# Patient Record
Sex: Male | Born: 1945 | Race: Black or African American | Hispanic: No | Smoking: Former smoker
Health system: Southern US, Community
[De-identification: ages and names within clinical notes are randomized; demographics above are authoritative.]

## PROBLEM LIST (undated history)

## (undated) DIAGNOSIS — I10 Essential (primary) hypertension: Secondary | ICD-10-CM

## (undated) DIAGNOSIS — B019 Varicella without complication: Secondary | ICD-10-CM

## (undated) DIAGNOSIS — R011 Cardiac murmur, unspecified: Secondary | ICD-10-CM

## (undated) DIAGNOSIS — S0591XA Unspecified injury of right eye and orbit, initial encounter: Secondary | ICD-10-CM

## (undated) DIAGNOSIS — G459 Transient cerebral ischemic attack, unspecified: Secondary | ICD-10-CM

## (undated) HISTORY — DX: Varicella without complication: B01.9

## (undated) HISTORY — PX: HERNIA REPAIR: SHX51

## (undated) HISTORY — DX: Transient cerebral ischemic attack, unspecified: G45.9

## (undated) HISTORY — PX: ABDOMINAL SURGERY: SHX537

## (undated) HISTORY — DX: Unspecified injury of right eye and orbit, initial encounter: S05.91XA

## (undated) HISTORY — DX: Cardiac murmur, unspecified: R01.1

## (undated) HISTORY — PX: AMPUTATION FINGER / THUMB: SUR24

## (undated) HISTORY — PX: ENUCLEATION: SHX628

---

## 1999-02-19 ENCOUNTER — Encounter: Payer: Self-pay | Admitting: Emergency Medicine

## 1999-02-19 ENCOUNTER — Emergency Department (HOSPITAL_COMMUNITY): Admission: EM | Admit: 1999-02-19 | Discharge: 1999-02-19 | Payer: Self-pay | Admitting: Emergency Medicine

## 2002-03-25 ENCOUNTER — Emergency Department (HOSPITAL_COMMUNITY): Admission: EM | Admit: 2002-03-25 | Discharge: 2002-03-25 | Payer: Self-pay | Admitting: Emergency Medicine

## 2002-03-25 ENCOUNTER — Encounter: Payer: Self-pay | Admitting: Emergency Medicine

## 2003-10-22 ENCOUNTER — Emergency Department (HOSPITAL_COMMUNITY): Admission: EM | Admit: 2003-10-22 | Discharge: 2003-10-22 | Payer: Self-pay | Admitting: Emergency Medicine

## 2003-12-01 ENCOUNTER — Emergency Department (HOSPITAL_COMMUNITY): Admission: EM | Admit: 2003-12-01 | Discharge: 2003-12-02 | Payer: Self-pay | Admitting: Emergency Medicine

## 2004-03-15 ENCOUNTER — Encounter: Admission: RE | Admit: 2004-03-15 | Discharge: 2004-06-13 | Payer: Self-pay | Admitting: Psychology

## 2004-10-02 ENCOUNTER — Ambulatory Visit (HOSPITAL_COMMUNITY): Admission: RE | Admit: 2004-10-02 | Discharge: 2004-10-02 | Payer: Self-pay | Admitting: Gastroenterology

## 2004-10-02 ENCOUNTER — Encounter (INDEPENDENT_AMBULATORY_CARE_PROVIDER_SITE_OTHER): Payer: Self-pay | Admitting: *Deleted

## 2009-09-20 ENCOUNTER — Ambulatory Visit (HOSPITAL_BASED_OUTPATIENT_CLINIC_OR_DEPARTMENT_OTHER): Admission: RE | Admit: 2009-09-20 | Discharge: 2009-09-20 | Payer: Self-pay | Admitting: General Surgery

## 2010-11-25 LAB — COMPREHENSIVE METABOLIC PANEL
ALT: 28 U/L (ref 0–53)
AST: 23 U/L (ref 0–37)
Albumin: 4 g/dL (ref 3.5–5.2)
Alkaline Phosphatase: 67 U/L (ref 39–117)
BUN: 14 mg/dL (ref 6–23)
CO2: 31 mEq/L (ref 19–32)
Calcium: 9.3 mg/dL (ref 8.4–10.5)
Chloride: 103 mEq/L (ref 96–112)
Creatinine, Ser: 0.96 mg/dL (ref 0.4–1.5)
GFR calc Af Amer: >60 mL/min (ref >60)
GFR calc non Af Amer: >60 mL/min (ref >60)
Glucose, Bld: 103 mg/dL — ABNORMAL HIGH (ref 70–99)
Potassium: 4 mEq/L (ref 3.5–5.1)
Sodium: 139 mEq/L (ref 135–145)
Total Bilirubin: 0.9 mg/dL (ref 0.3–1.2)
Total Protein: 7.4 g/dL (ref 6.0–8.3)

## 2010-11-25 LAB — DIFFERENTIAL
Eosinophils Absolute: 0.1 10*3/uL (ref 0.0–0.7)
Lymphocytes Relative: 42 % (ref 12–46)
Lymphs Abs: 1.9 10*3/uL (ref 0.7–4.0)
Monocytes Relative: 12 % (ref 3–12)
Neutro Abs: 2 10*3/uL (ref 1.7–7.7)

## 2010-11-25 LAB — CBC
HCT: 40.5 % (ref 39.0–52.0)
Hemoglobin: 13.8 g/dL (ref 13.0–17.0)
MCHC: 34 g/dL (ref 30.0–36.0)
MCV: 94.8 fL (ref 78.0–100.0)
Platelets: 175 10*3/uL (ref 150–400)
RBC: 4.27 MIL/uL (ref 4.22–5.81)
RDW: 12.9 % (ref 11.5–15.5)
WBC: 4.5 10*3/uL (ref 4.0–10.5)

## 2011-10-18 ENCOUNTER — Encounter (HOSPITAL_COMMUNITY): Payer: Self-pay

## 2011-10-18 ENCOUNTER — Encounter: Payer: Self-pay | Admitting: Family Medicine

## 2011-10-18 ENCOUNTER — Ambulatory Visit (HOSPITAL_COMMUNITY)
Admission: RE | Admit: 2011-10-18 | Discharge: 2011-10-18 | Disposition: A | Discharge status: Home / Self Care | Payer: Medicare Other | Source: Ambulatory Visit | Attending: Family Medicine | Admitting: Family Medicine

## 2011-10-18 ENCOUNTER — Other Ambulatory Visit: Payer: Self-pay | Admitting: Family Medicine

## 2011-10-18 ENCOUNTER — Ambulatory Visit (INDEPENDENT_AMBULATORY_CARE_PROVIDER_SITE_OTHER): Payer: Medicare Other | Admitting: Family Medicine

## 2011-10-18 DIAGNOSIS — F32A Depression, unspecified: Secondary | ICD-10-CM | POA: Insufficient documentation

## 2011-10-18 DIAGNOSIS — Z125 Encounter for screening for malignant neoplasm of prostate: Secondary | ICD-10-CM

## 2011-10-18 DIAGNOSIS — F329 Major depressive disorder, single episode, unspecified: Secondary | ICD-10-CM

## 2011-10-18 DIAGNOSIS — R1031 Right lower quadrant pain: Secondary | ICD-10-CM

## 2011-10-18 DIAGNOSIS — Q619 Cystic kidney disease, unspecified: Secondary | ICD-10-CM | POA: Insufficient documentation

## 2011-10-18 DIAGNOSIS — E669 Obesity, unspecified: Secondary | ICD-10-CM

## 2011-10-18 DIAGNOSIS — I10 Essential (primary) hypertension: Secondary | ICD-10-CM | POA: Insufficient documentation

## 2011-10-18 DIAGNOSIS — R109 Unspecified abdominal pain: Secondary | ICD-10-CM

## 2011-10-18 DIAGNOSIS — D1809 Hemangioma of other sites: Secondary | ICD-10-CM | POA: Insufficient documentation

## 2011-10-18 DIAGNOSIS — R935 Abnormal findings on diagnostic imaging of other abdominal regions, including retroperitoneum: Secondary | ICD-10-CM

## 2011-10-18 DIAGNOSIS — Z23 Encounter for immunization: Secondary | ICD-10-CM

## 2011-10-18 DIAGNOSIS — Z0000 Encounter for general adult medical examination without abnormal findings: Secondary | ICD-10-CM

## 2011-10-18 HISTORY — DX: Essential (primary) hypertension: I10

## 2011-10-18 LAB — CBC
HCT: 42.6 % (ref 39.0–52.0)
MCH: 30.7 pg (ref 26.0–34.0)
MCV: 90.1 fL (ref 78.0–100.0)
Platelets: 298 10*3/uL (ref 150–400)
RBC: 4.73 MIL/uL (ref 4.22–5.81)

## 2011-10-18 LAB — COMPREHENSIVE METABOLIC PANEL
ALT: 22 U/L (ref 0–53)
BUN: 19 mg/dL (ref 6–23)
CO2: 23 mEq/L (ref 19–32)
Creat: 1.01 mg/dL (ref 0.50–1.35)
Glucose, Bld: 97 mg/dL (ref 70–99)
Total Bilirubin: 1 mg/dL (ref 0.3–1.2)

## 2011-10-18 LAB — POCT URINALYSIS DIPSTICK
Glucose, UA: NEGATIVE
Leukocytes, UA: NEGATIVE
Nitrite, UA: NEGATIVE

## 2011-10-18 LAB — LIPID PANEL
LDL Cholesterol: 80 mg/dL (ref 0–99)
Total CHOL/HDL Ratio: 2.9 Ratio
VLDL: 14 mg/dL (ref 0–40)

## 2011-10-18 LAB — HEMOCCULT GUIAC POC 1CARD (OFFICE): Fecal Occult Blood, POC: NEGATIVE

## 2011-10-18 MED ORDER — IOHEXOL 300 MG/ML  SOLN
100.0000 mL | Freq: Once | INTRAMUSCULAR | Status: AC | PRN
  Administered 2011-10-18: 100 mL via INTRAVENOUS

## 2011-10-18 NOTE — Progress Notes (Signed)
Subjective:    Patient ID: Terry Russo, male    DOB: December 20, 1945, 66 y.o.   MRN: [id number]  HPI     This is a brief HPI for this 66 yo AA male who comes in today for physical exam. He has HTN and  Depression. He has a complaint of 1 week history of moderately severe abdominal pain with decreased   appetite and RLQ pain with nausea. No change in BMs. He denies BRBPR but stool color has been light brown.  He was seen at [facility] about one week ago at which time an abd. film did not show any abnormality per pt.  He had significant abd. trauma and exploratory lap done at age 28 after a forklift fell across his torso.    Review of Systems  Constitutional: Positive for appetite change. Negative for fever, fatigue and unexpected weight change.  HENT: Negative.   Eyes: Positive for visual disturbance.  Respiratory: Negative.   Cardiovascular: Negative.   Gastrointestinal:       As per HPI  Genitourinary: Negative.   Musculoskeletal: Negative.   Neurological: Negative.   Hematological: Negative.   Psychiatric/Behavioral: Positive for confusion. The patient is nervous/anxious.        Anxiety associated with abdominal symptoms  Completion of  Beck Depression Scale; Score=0       Objective:   Physical Exam  Constitutional: He is oriented to person, place, and time. He appears well-developed and well-nourished. No distress.  HENT:  Head: Normocephalic and atraumatic.  Mouth/Throat: Oropharynx is clear and moist.  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 Neck supple. No thyromegaly present.  Abdominal: He exhibits distension. He exhibits no mass. There is tenderness. There is guarding.       Increased bowel sounds c/w borborygmi esp. on right side of abdomen.   Genitourinary: Rectum normal. Guaiac negative stool.       Prostate slightly enlarged and firm.  Musculoskeletal: Normal range of motion.       Digits missing on left hand (due to accident years ago).   Lymphadenopathy:    He has no cervical adenopathy.  Neurological: He is alert and oriented to person, place, and time. No cranial nerve deficit. Coordination normal.  Skin: Skin is warm and dry.  Psychiatric: He has a normal mood and affect. His behavior is normal. Judgment and thought content normal.     Results for orders placed in visit on 10/18/11  POCT URINALYSIS DIPSTICK      Component Value Range   Color, UA brown     Clarity, UA clear     Glucose, UA neg     Bilirubin, UA moderate     Ketones, UA trace     Spec Grav, UA >=1.030     Blood, UA trace     pH, UA 5.5     Protein, UA 100     Urobilinogen, UA 4.0     Nitrite, UA neg     Leukocytes, UA Negative    HEMOCCULT GUIAC POC 1CARD (OFFICE)      Component Value Range   Fecal Occult Blood, POC Negative     Card #1 Date       Card #2 Fecal Occult Blod, POC       Card #2 Date       Card #3 Fecal Occult Blood, POC       Card #3 Date  ECG:   Sinus rhythm with anteroseptal ST elevation and inferior/lateral T wave changes- nonspecific    Assessment & Plan:  Abdominal Pain :         CT of abd ordered to be done today (with contrast),CMET STAT.  Hypertension :             Well-controlled on current medication. Pt has RFs.    Depression :                Stable on Sertraline 50 mg daily. Pt has RFs   HCM: Pneumovax given today.   Labs pending:   CBC, CMET, TSH, LIPIDS, PSA

## 2011-10-21 ENCOUNTER — Encounter: Payer: Self-pay | Admitting: Gastroenterology

## 2011-10-29 ENCOUNTER — Other Ambulatory Visit: Payer: Self-pay | Admitting: Family Medicine

## 2011-10-30 ENCOUNTER — Ambulatory Visit (INDEPENDENT_AMBULATORY_CARE_PROVIDER_SITE_OTHER): Payer: Medicare Other | Admitting: Gastroenterology

## 2011-10-30 ENCOUNTER — Encounter: Payer: Self-pay | Admitting: Gastroenterology

## 2011-10-30 VITALS — BP 140/76 | HR 80 | Ht 69.0 in | Wt 194.8 lb

## 2011-10-30 DIAGNOSIS — R933 Abnormal findings on diagnostic imaging of other parts of digestive tract: Secondary | ICD-10-CM

## 2011-10-30 DIAGNOSIS — R109 Unspecified abdominal pain: Secondary | ICD-10-CM

## 2011-10-30 MED ORDER — PEG-KCL-NACL-NASULF-NA ASC-C 100 G PO SOLR: 1.0000 | ORAL | Status: DC

## 2011-10-30 NOTE — Progress Notes (Signed)
HPI: This is a     very pleasant 66 year old man whom I am meeting for the first time today   Pains in right, lower abd for 3 weeks.  Much better now.He is passing gas.  Belching and passing gas helps.  No vomiting.  + nauseas.  Has not had much appetite.  Fills up quick.    Appetite imroving (ate a hamburger, fries last night without vomiting).  Did some sit ups  Had colonoscopy "about 3 years ago."  Dr. Loreta Ave.  He was supposed to go back to see her, but hasn't yet.  Does not recall EGD in the past.    Overall he has lost 6-7 pounds lately.  No real changed in his bowels lately.  No overt bleeding.   Feels warm in stomach, but no fevers.  No colon cancer in family.   IMPRESSION from CT scan 2 weeks ago:  Inflammatory process with wall thickening in the right lower abdomen, favored to reflect abnormal terminal ileum, with adjacent 2.6 cm fluid collection. This appearance suggests infectious or inflammatory enteritis such as Crohn's disease. Possible involvement of the cecum. Given the abnormal appearance, colonoscopy is suggested to exclude underlying cecal mass. The appendix appears relatively uninvolved. Dilated loops of proximal/mid small bowel, favored to reflect secondary adynamic ileus, less likely early/partial small bowel obstruction.  bloodwork 2 weeks ago the same day as his CT scan showed normal CBC, normal complete metabolic profile, normal TSH, he was fecal occult negative  Review of systems: Pertinent positive and negative review of systems were noted in the above HPI section. Complete review of systems was performed and was otherwise normal.    Past Medical History  Diagnosis Date  . Hypertension   . Right eye injury     pt has glass eye    Past Surgical History  Procedure Date  . Amputation finger / thumb     left  . Abdominal surgery   . Hernia repair   . Enucleation     glass eye    Current Outpatient Prescriptions  Medication Sig Dispense Refill  .  aspirin 81 MG tablet Take 81 mg by mouth daily.      . Multiple Vitamin (DAILY-VITAMIN) TABS Take 1 tablet by mouth daily.       . sertraline (ZOLOFT) 50 MG tablet Take 50 mg by mouth every other day.       . verapamil (VERELAN PM) 120 MG 24 hr capsule Take 120 mg by mouth every other day.         Allergies as of 10/30/2011  . (No Known Allergies)    Family History  Problem Relation Age of Onset  . Hypertension Sister   . Hypertension Sister   . Diabetes Brother     History   Social History  . Marital Status: Married    Spouse Name: N/A    Number of Children: 7  . Years of Education: N/A   Occupational History  . welder---retired    Social History Main Topics  . Smoking status: Former Smoker -- 1.5 packs/day for 20 years    Types: Cigarettes    Quit date: 10/11/2003  . Smokeless tobacco: Never Used  . Alcohol Use: Yes     half to a whole beer - occasionally (social)  . Drug Use: No  . Sexually Active: Not on file   Other Topics Concern  . Not on file   Social History Narrative  . No narrative on file  Physical Exam: There were no vitals taken for this visit. Constitutional: generally well-appearing Psychiatric: alert and oriented x3 Eyes: extraocular movements intact Mouth: oral pharynx moist, no lesions Neck: supple no lymphadenopathy Cardiovascular: heart regular rate and rhythm Lungs: clear to auscultation bilaterally Abdomen:  slightly distended, tympanic, very mildly tender in the right lower quadrant, no obvious ascites, no peritoneal signs, normal bowel sounds Extremities: no lower extremity edema bilaterally Skin: no lesions on visible extremities    Assessment and plan: 66 y.o. male with  likely partial bowel obstruction, abnormal right lower quadrant on CT scan  This has really been about a three-week process with probable partial bowel obstruction. Seems to be at the level of the proximal colon distal small bowel. He does not have  history consistent with inflammatory bowel disease and so I am suspicious that he has underlying malignancy. There is a "round fluid collection" on CT scan that might actually be a necrotic lymph node. We'll proceed with CT scan tomorrow at the hospital. He is eating fairly normally, passing gas and having stools and so hopefully he will not have too much difficulty prepping for the procedure.

## 2011-10-30 NOTE — Patient Instructions (Addendum)
You will be set up for a colonoscopy tomorrow at [facility] (does not need propofol). We will get records from Dr. Loreta Ave colonoscopy.

## 2011-10-31 ENCOUNTER — Other Ambulatory Visit: Payer: Self-pay | Admitting: Gastroenterology

## 2011-10-31 ENCOUNTER — Telehealth: Payer: Self-pay

## 2011-10-31 ENCOUNTER — Encounter (HOSPITAL_COMMUNITY)
Admission: RE | Disposition: A | Discharge status: Home Health | Payer: Self-pay | Source: Ambulatory Visit | Attending: Gastroenterology

## 2011-10-31 ENCOUNTER — Encounter (HOSPITAL_COMMUNITY): Payer: Self-pay

## 2011-10-31 ENCOUNTER — Ambulatory Visit (HOSPITAL_COMMUNITY)
Admission: RE | Admit: 2011-10-31 | Discharge: 2011-10-31 | Disposition: A | Discharge status: Home Health | Payer: Medicare Other | Source: Ambulatory Visit | Attending: Gastroenterology | Admitting: Gastroenterology

## 2011-10-31 DIAGNOSIS — R933 Abnormal findings on diagnostic imaging of other parts of digestive tract: Secondary | ICD-10-CM

## 2011-10-31 DIAGNOSIS — R110 Nausea: Secondary | ICD-10-CM | POA: Insufficient documentation

## 2011-10-31 DIAGNOSIS — I10 Essential (primary) hypertension: Secondary | ICD-10-CM | POA: Insufficient documentation

## 2011-10-31 DIAGNOSIS — Z7982 Long term (current) use of aspirin: Secondary | ICD-10-CM | POA: Insufficient documentation

## 2011-10-31 DIAGNOSIS — R1031 Right lower quadrant pain: Secondary | ICD-10-CM | POA: Insufficient documentation

## 2011-10-31 DIAGNOSIS — Z79899 Other long term (current) drug therapy: Secondary | ICD-10-CM | POA: Insufficient documentation

## 2011-10-31 DIAGNOSIS — K5730 Diverticulosis of large intestine without perforation or abscess without bleeding: Secondary | ICD-10-CM | POA: Insufficient documentation

## 2011-10-31 HISTORY — PX: COLONOSCOPY: SHX5424

## 2011-10-31 SURGERY — COLONOSCOPY
Anesthesia: Moderate Sedation

## 2011-10-31 MED ORDER — DIPHENHYDRAMINE HCL 50 MG/ML IJ SOLN
INTRAMUSCULAR | Status: AC
  Filled 2011-10-31: qty 1

## 2011-10-31 MED ORDER — MIDAZOLAM HCL 10 MG/2ML IJ SOLN
INTRAMUSCULAR | Status: AC
  Filled 2011-10-31: qty 4

## 2011-10-31 MED ORDER — FENTANYL CITRATE 0.05 MG/ML IJ SOLN
INTRAMUSCULAR | Status: AC
  Filled 2011-10-31: qty 4

## 2011-10-31 MED ORDER — MIDAZOLAM HCL 10 MG/2ML IJ SOLN
INTRAMUSCULAR | Status: DC | PRN
  Administered 2011-10-31: 1 mg via INTRAVENOUS
  Administered 2011-10-31 (×2): 2 mg via INTRAVENOUS

## 2011-10-31 MED ORDER — SODIUM CHLORIDE 0.9 % IV SOLN
Freq: Once | INTRAVENOUS | Status: AC
  Administered 2011-10-31: 500 mL via INTRAVENOUS

## 2011-10-31 MED ORDER — FENTANYL NICU IV SYRINGE 50 MCG/ML
INJECTION | INTRAMUSCULAR | Status: DC | PRN
  Administered 2011-10-31 (×2): 25 ug via INTRAVENOUS

## 2011-10-31 NOTE — H&P (View-Only) (Signed)
HPI: This is a     very pleasant 65-year-old man whom I am meeting for the first time today   Pains in right, lower abd for 3 weeks.  Much better now.He is passing gas.  Belching and passing gas helps.  No vomiting.  + nauseas.  Has not had much appetite.  Fills up quick.    Appetite imroving (ate a hamburger, fries last night without vomiting).  Did some sit ups  Had colonoscopy "about 3 years ago."  Dr. Mann.  He was supposed to go back to see her, but hasn't yet.  Does not recall EGD in the past.    Overall he has lost 6-7 pounds lately.  No real changed in his bowels lately.  No overt bleeding.   Feels warm in stomach, but no fevers.  No colon cancer in family.   IMPRESSION from CT scan 2 weeks ago:  Inflammatory process with wall thickening in the right lower abdomen, favored to reflect abnormal terminal ileum, with adjacent 2.6 cm fluid collection. This appearance suggests infectious or inflammatory enteritis such as Crohn's disease. Possible involvement of the cecum. Given the abnormal appearance, colonoscopy is suggested to exclude underlying cecal mass. The appendix appears relatively uninvolved. Dilated loops of proximal/mid small bowel, favored to reflect secondary adynamic ileus, less likely early/partial small bowel obstruction.  bloodwork 2 weeks ago the same day as his CT scan showed normal CBC, normal complete metabolic profile, normal TSH, he was fecal occult negative  Review of systems: Pertinent positive and negative review of systems were noted in the above HPI section. Complete review of systems was performed and was otherwise normal.    Past Medical History  Diagnosis Date  . Hypertension   . Right eye injury     pt has glass eye    Past Surgical History  Procedure Date  . Amputation finger / thumb     left  . Abdominal surgery   . Hernia repair   . Enucleation     glass eye    Current Outpatient Prescriptions  Medication Sig Dispense Refill  .  aspirin 81 MG tablet Take 81 mg by mouth daily.      . Multiple Vitamin (DAILY-VITAMIN) TABS Take 1 tablet by mouth daily.       . sertraline (ZOLOFT) 50 MG tablet Take 50 mg by mouth every other day.       . verapamil (VERELAN PM) 120 MG 24 hr capsule Take 120 mg by mouth every other day.         Allergies as of 10/30/2011  . (No Known Allergies)    Family History  Problem Relation Age of Onset  . Hypertension Sister   . Hypertension Sister   . Diabetes Brother     History   Social History  . Marital Status: Married    Spouse Name: N/A    Number of Children: 7  . Years of Education: N/A   Occupational History  . welder---retired    Social History Main Topics  . Smoking status: Former Smoker -- 1.5 packs/day for 20 years    Types: Cigarettes    Quit date: 10/11/2003  . Smokeless tobacco: Never Used  . Alcohol Use: Yes     half to a whole beer - occasionally (social)  . Drug Use: No  . Sexually Active: Not on file   Other Topics Concern  . Not on file   Social History Narrative  . No narrative on file         Physical Exam: There were no vitals taken for this visit. Constitutional: generally well-appearing Psychiatric: alert and oriented x3 Eyes: extraocular movements intact Mouth: oral pharynx moist, no lesions Neck: supple no lymphadenopathy Cardiovascular: heart regular rate and rhythm Lungs: clear to auscultation bilaterally Abdomen:  slightly distended, tympanic, very mildly tender in the right lower quadrant, no obvious ascites, no peritoneal signs, normal bowel sounds Extremities: no lower extremity edema bilaterally Skin: no lesions on visible extremities    Assessment and plan: 65 y.o. male with  likely partial bowel obstruction, abnormal right lower quadrant on CT scan  This has really been about a three-week process with probable partial bowel obstruction. Seems to be at the level of the proximal colon distal small bowel. He does not have  history consistent with inflammatory bowel disease and so I am suspicious that he has underlying malignancy. There is a "round fluid collection" on CT scan that might actually be a necrotic lymph node. We'll proceed with CT scan tomorrow at the hospital. He is eating fairly normally, passing gas and having stools and so hopefully he will not have too much difficulty prepping for the procedure.     

## 2011-10-31 NOTE — Discharge Instructions (Signed)

## 2011-10-31 NOTE — Telephone Encounter (Signed)
Message copied by Donata Duff on Thu Oct 31, 2011  9:12 AM ------      Message from: Rob Bunting P      Created: Thu Oct 31, 2011  9:02 AM       Glenford Garis, can you set him up with repeat CT scan abd/pelvis with IV and PO contrast tomorrow or early next week.  He just completed colonoscopy now will be leaving [facility] in next 1/2 hour or so.            Matt,      This is the patient whose scan I asked you to take a look at.              Samson Frederic

## 2011-10-31 NOTE — Telephone Encounter (Signed)
Pt has been notified his wife will pick up contrast today

## 2011-10-31 NOTE — Interval H&P Note (Signed)
History and Physical Interval Note:  10/31/2011 7:12 AM  Margrett Rud  has presented today for surgery, with the diagnosis of Nonspecific (abnormal) findings on radiological and other examination of gastrointestinal tract [793.4]  The various methods of treatment have been discussed with the patient and family. After consideration of risks, benefits and other options for treatment, the patient has consented to  Procedure(s) (LRB): COLONOSCOPY (N/A) as a surgical intervention .  The patients' history has been reviewed, patient examined, no change in status, stable for surgery.  I have reviewed the patients' chart and labs.  Questions were answered to the patient's satisfaction.     Rob Bunting

## 2011-10-31 NOTE — Telephone Encounter (Signed)
  You have been scheduled for a CT scan of the abdomen and pelvis at [facility] ([address]---this is in the same building as [facility]).   You are scheduled on 11/01/11  at 10 am. You should arrive 15 minutes prior to your appointment time for registration. Please follow the written instructions below on the day of your exam:  WARNING: IF YOU ARE ALLERGIC TO IODINE/X-RAY DYE, PLEASE NOTIFY RADIOLOGY IMMEDIATELY AT [phone]! YOU WILL BE GIVEN A 13 HOUR PREMEDICATION PREP.  1) Do not eat or drink anything after 6 am  (4 hours prior to your test) 2) You have been given 2 bottles of oral contrast to drink. The solution may taste better if refrigerated, but do NOT add ice or any other liquid to this solution. Shake well before drinking.    Drink 1 bottle of contrast @ 8 am  (2 hours prior to your exam)  Drink 1 bottle of contrast @ 9 am  (1 hour prior to your exam)  You may take any medications as prescribed with a small amount of water except for the following: Metformin, Glucophage, Glucovance, Avandamet, Riomet, Fortamet, Actoplus Met, Janumet, Glumetza or Metaglip. The above medications must be held the day of the exam AND 48 hours after the exam.  The purpose of you drinking the oral contrast is to aid in the visualization of your intestinal tract. The contrast solution may cause some diarrhea. Before your exam is started, you will be given a small amount of fluid to drink. Depending on your individual set of symptoms, you may also receive an intravenous injection of x-ray contrast/dye. Plan on being at [facility] for 30 minutes or long, depending on the type of exam you are having performed.  If you have any questions regarding your exam or if you need to reschedule, you may call the CT department at [phone] between the hours of 8:00 am and 5:00 pm, Monday-Friday.  ________________________________________________________________________ Left message on  machine to call back

## 2011-10-31 NOTE — Op Note (Signed)
[facility] [address] [place], [place]  [address]  COLONOSCOPY PROCEDURE REPORT  PATIENT:  Terry Russo, Terry Russo  MR#:  [id number] BIRTHDATE:  Aug 20, 1946, 65 yrs. old  GENDER:  male ENDOSCOPIST:  Rachael Fee, MD REF. BY:  Dow Adolph, M.D. PROCEDURE DATE:  10/31/2011 PROCEDURE:  Colonoscopy with biopsy ASA CLASS:  Class II INDICATIONS:  partial bowel obstruction symptoms for 3 weeks, abnormal CT scan (same day normal CBC, cmet). Bowels normal throughout.  No fevers or chills. MEDICATIONS:   Fentanyl 50 mcg IV, Versed 5 mg IV  DESCRIPTION OF PROCEDURE:   After the risks benefits and alternatives of the procedure were thoroughly explained, informed consent was obtained.  Digital rectal exam was performed and revealed no rectal masses.   The Pentax Colonoscope [id number] endoscope was introduced through the anus and advanced to the terminal ileum which was intubated for a short distance, without limitations.  The quality of the prep was good..  The instrument was then slowly withdrawn as the colon was fully examined. <<PROCEDUREIMAGES>> FINDINGS:  Moderate diverticulosis was found throughout the colon (see image1).  Abnormal appearing mucosa. In the cecum there was a 5-85mm area of slightly inflamed, eroded mucosa. This was not clearly an ulcer and certainly not neoplastic appearing. Mucosal biopsies were taken (pathology jar 1) (see image5).  The terminal ileum appeared normal (see image3).  This was otherwise a normal examination of the colon (see image6 and image2).   Retroflexed views in the rectum revealed no abnormalities. COMPLICATIONS:  None ENDOSCOPIC IMPRESSION: 1) Moderate diverticulosis throughout the colon, including ascending colon and cecum.  None of the diveritulum appeared inflammed. 2) Focal erosion, abnormal mucosa in cecum; biopsied and sent to pathology 3) Normal terminal ileum (intubated to 10cm into the ileum) 4) Otherwise normal  examination  RECOMMENDATIONS: 1) Await biopsy results. 2) My office will set up repeat CT scan abd/pelvis.  He prepped without trouble last night and pain, nausea have been improving since original symptoms 3 weeks ago, but he is still distended and had post prandial discomfort. I'm suspicious or persistent partial small bowel obstruction that I cannot explain.   I have asked Dr. Dwain Sarna to review his films and will discuss with him later.  ______________________________ Rachael Fee, MD  n. eSIGNED:   Rachael Fee at 10/31/2011 09:02 AM  Stark Bray, [id number]

## 2011-11-01 ENCOUNTER — Encounter (HOSPITAL_COMMUNITY): Payer: Self-pay | Admitting: Gastroenterology

## 2011-11-01 ENCOUNTER — Ambulatory Visit (INDEPENDENT_AMBULATORY_CARE_PROVIDER_SITE_OTHER)
Admission: RE | Admit: 2011-11-01 | Discharge: 2011-11-01 | Disposition: A | Discharge status: Home / Self Care | Payer: Medicare Other | Source: Ambulatory Visit | Attending: Gastroenterology | Admitting: Gastroenterology

## 2011-11-01 DIAGNOSIS — R933 Abnormal findings on diagnostic imaging of other parts of digestive tract: Secondary | ICD-10-CM

## 2011-11-01 MED ORDER — IOHEXOL 300 MG/ML  SOLN
100.0000 mL | Freq: Once | INTRAMUSCULAR | Status: AC | PRN
  Administered 2011-11-01: 100 mL via INTRAVENOUS

## 2011-12-03 ENCOUNTER — Encounter: Payer: Self-pay | Admitting: Gastroenterology

## 2011-12-03 ENCOUNTER — Ambulatory Visit (INDEPENDENT_AMBULATORY_CARE_PROVIDER_SITE_OTHER): Payer: Medicare Other | Admitting: Gastroenterology

## 2011-12-03 VITALS — BP 120/86 | HR 88 | Ht 71.0 in | Wt 199.0 lb

## 2011-12-03 DIAGNOSIS — R933 Abnormal findings on diagnostic imaging of other parts of digestive tract: Secondary | ICD-10-CM

## 2011-12-03 NOTE — Progress Notes (Signed)
Review of pertinent gastrointestinal problems: 1. right-sided abdominal inflammatory process; presented with RLQ pain, unclear etiology, favor infection;  Initial CT scan early Feb 2013: Inflammatory process with wall thickening in the right lower abdomen, favored to reflect abnormal terminal ileum, with adjacent 2.6 cm fluid collection. This appearance suggests infectious or inflammatory enteritis such as Crohn's disease. Possible involvement of the cecum. Given the abnormal appearance, colonoscopy is suggested to exclude underlying cecal mass. The appendix appears relatively uninvolved. Dilated loops of proximal/mid small bowel, favored to reflect secondary adynamic ileus, less likely early/partial small bowel obstruction.  CBC, cmet were normal.Colonsocopy 10/2011 found normal terminal ileum, focal erosion in the cecum that was biopsied and was normal, diverticulosis.  CT scan after colonoscopy: Resolving enterocolitis which predominantly involves the terminal ileum. Differential diagnosis includes infectiou setiologies and Crohn's disease. Decreased small bowel dilatation, consistent with resolving distal small bowel obstruction or ileus. No evidence of abscess. Normal appearance of appendix. 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 He had a CT scan shortly afterwards, all of those results are summarized above.   He has been feeling fine.  The cramps have completely resolved.  Everything has gotten.  He is "back to normal".  Weight fine.   Past Medical History  Diagnosis Date  . Hypertension   . Right eye injury     pt has glass eye    Past Surgical History  Procedure Date  . Amputation finger / thumb     left  . Abdominal surgery   . Hernia repair   . Enucleation     glass eye  . Colonoscopy 10/31/2011    Procedure: COLONOSCOPY;  Surgeon: Rob Bunting, MD;   Location: [facility];  Service: Endoscopy;  Laterality: N/A;    Current Outpatient Prescriptions  Medication Sig Dispense Refill  . aspirin 81 MG tablet Take 81 mg by mouth daily.      . Multiple Vitamin (DAILY-VITAMIN) TABS Take 1 tablet by mouth daily.       . peg 3350 powder (MOVIPREP) 100 G SOLR Take 1 kit (100 g total) by mouth as directed. See written handout  1 kit  0  . sertraline (ZOLOFT) 50 MG tablet Take 50 mg by mouth every other day.       . verapamil (VERELAN PM) 120 MG 24 hr capsule Take 120 mg by mouth every other day.         Allergies as of 12/03/2011  . (No Known Allergies)    Family History  Problem Relation Age of Onset  . Hypertension Sister   . Hypertension Sister   . Diabetes Brother     History   Social History  . Marital Status: Married    Spouse Name: N/A    Number of Children: 7  . Years of Education: N/A   Occupational History  . welder---retired    Social History Main Topics  . Smoking status: Former Smoker -- 1.5 packs/day for 20 years    Types: Cigarettes    Quit date: 10/11/2003  . Smokeless tobacco: Never Used  . Alcohol Use: No     half to a whole beer - occasionally (social)  . Drug Use: No  . Sexually Active: Not on file   Other Topics Concern  . Not on file   Social History Narrative  . No  narrative on file      Physical Exam: BP 120/86  Pulse 88  Ht 5\' 11"  (1.803 m)  Wt 199 lb (90.266 kg)  BMI 27.75 kg/m2 Constitutional: generally well-appearing Psychiatric: alert and oriented x3 Abdomen: soft, nontender, nondistended, no obvious ascites, no peritoneal signs, normal bowel sounds     Assessment and plan: 66 y.o. male with right lower quadrant inflammation, infection  Clinically he has completely returned to normal. It is not clear to me if he had an infectious or inflammatory process. Certainly he improved without even being on antibiotics. Given the impressive Ms. of the CT scan I think I would like to  repeat it one last time in about 6 weeks from now.

## 2011-12-03 NOTE — Patient Instructions (Addendum)
You will be set up for a CT scan of abdomen and pelvis with IV and oral contrast in 6 weeks from now.  If the CT continues to improve or is normal appearing then no further testing is needed. We will call you to set this up in about 6 weeks.   Please call if you do not hear from [place].

## 2011-12-26 ENCOUNTER — Telehealth: Payer: Self-pay | Admitting: Gastroenterology

## 2011-12-26 NOTE — Telephone Encounter (Signed)
lmom that I see no appt for pt in the system, requested they call me in the am to discuss this

## 2012-01-02 NOTE — Telephone Encounter (Signed)
No one ever called back.

## 2012-01-14 ENCOUNTER — Telehealth: Payer: Self-pay

## 2012-01-14 DIAGNOSIS — R140 Abdominal distension (gaseous): Secondary | ICD-10-CM

## 2012-01-14 NOTE — Telephone Encounter (Signed)
Message copied by Donata Duff on Tue Jan 14, 2012  8:08 AM ------      Message from: Donata Duff      Created: Tue Dec 03, 2011 11:01 AM                   ----- Message -----         From: Donata Duff, CMA         Sent: 12/03/2011  11:00 AM           To: Donata Duff, CMA            Pt needs CT scan see 12/03/11 office note

## 2012-01-15 NOTE — Telephone Encounter (Signed)
  You have been scheduled for a CT scan of the abdomen and pelvis at [facility] ([address]---this is in the same building as [facility]).   You are scheduled on 01/17/12 at 2 pm . You should arrive 15 minutes prior to your appointment time for registration. Please follow the written instructions below on the day of your exam:  WARNING: IF YOU ARE ALLERGIC TO IODINE/X-RAY DYE, PLEASE NOTIFY RADIOLOGY IMMEDIATELY AT [phone]! YOU WILL BE GIVEN A 13 HOUR PREMEDICATION PREP.  1) Do not eat or drink anything after 10 am  (4 hours prior to your test) 2) You have been given 2 bottles of oral contrast to drink. The solution may taste better if refrigerated, but do NOT add ice or any other liquid to this solution. Shake well before drinking.    Drink 1 bottle of contrast @ 12 noon  (2 hours prior to your exam)  Drink 1 bottle of contrast @ 1 pm  (1 hour prior to your exam)  You may take any medications as prescribed with a small amount of water except for the following: Metformin, Glucophage, Glucovance, Avandamet, Riomet, Fortamet, Actoplus Met, Janumet, Glumetza or Metaglip. The above medications must be held the day of the exam AND 48 hours after the exam.  The purpose of you drinking the oral contrast is to aid in the visualization of your intestinal tract. The contrast solution may cause some diarrhea. Before your exam is started, you will be given a small amount of fluid to drink. Depending on your individual set of symptoms, you may also receive an intravenous injection of x-ray contrast/dye. Plan on being at [facility] for 30 minutes or long, depending on the type of exam you are having performed.  If you have any questions regarding your exam or if you need to reschedule, you may call the CT department at [phone] between the hours of 8:00 am and 5:00 pm, Monday-Friday.  ________________________________________________________________________  The pt has  been notified and instructed.  His wife will pick up the contrast at the front desk today as well as a copy of the instructions

## 2012-01-17 ENCOUNTER — Ambulatory Visit (INDEPENDENT_AMBULATORY_CARE_PROVIDER_SITE_OTHER)
Admission: RE | Admit: 2012-01-17 | Discharge: 2012-01-17 | Disposition: A | Discharge status: Home / Self Care | Payer: Medicare Other | Source: Ambulatory Visit | Attending: Gastroenterology | Admitting: Gastroenterology

## 2012-01-17 DIAGNOSIS — R109 Unspecified abdominal pain: Secondary | ICD-10-CM

## 2012-01-17 DIAGNOSIS — R143 Flatulence: Secondary | ICD-10-CM

## 2012-01-17 DIAGNOSIS — R140 Abdominal distension (gaseous): Secondary | ICD-10-CM

## 2012-01-17 DIAGNOSIS — R141 Gas pain: Secondary | ICD-10-CM

## 2012-01-17 MED ORDER — IOHEXOL 300 MG/ML  SOLN
100.0000 mL | Freq: Once | INTRAMUSCULAR | Status: AC | PRN
  Administered 2012-01-17: 100 mL via INTRAVENOUS

## 2012-05-08 ENCOUNTER — Ambulatory Visit (INDEPENDENT_AMBULATORY_CARE_PROVIDER_SITE_OTHER): Payer: Self-pay | Admitting: Emergency Medicine

## 2012-05-08 ENCOUNTER — Ambulatory Visit: Payer: Self-pay

## 2012-05-08 VITALS — BP 138/84 | HR 82 | Temp 98.0°F | Resp 18 | Ht 69.0 in | Wt 188.0 lb

## 2012-05-08 DIAGNOSIS — M79646 Pain in unspecified finger(s): Secondary | ICD-10-CM

## 2012-05-08 DIAGNOSIS — M79609 Pain in unspecified limb: Secondary | ICD-10-CM

## 2012-05-08 MED ORDER — HYDROCODONE-ACETAMINOPHEN 5-325 MG PO TABS: 1.0000 | ORAL_TABLET | Freq: Four times a day (QID) | ORAL | Status: AC | PRN

## 2012-05-08 NOTE — Progress Notes (Signed)
  Subjective:    Patient ID: Terry Russo, male    DOB: Aug 08, 1946, 67 y.o.   MRN: [id number]  HPI patient was in his usual state of health until yesterday when he was working on his car slammed his right thumb in the car to work. He is here with pain and swelling at the end of his thumb he tonight he developed large amount of blood underneath his nail. He is having severe pain at the tip of his thumb.    Review of Systems     Objective:   Physical Exam there is a large subungual hematoma involving the right thumb. The IP joint appears to be normal. There is significant swelling of the flexor portion of the thumb.  [facility] reading (PRIMARY) by  Dr. Cleta Alberts no fracture seen        Assessment & Plan:  Patient has complete subungual hematoma. We'll do a digital block and remove the nail. I think he would be more comfortable with this. I will give him a prescription for hydrocodone for pain.

## 2012-05-08 NOTE — Progress Notes (Signed)
   Patient ID: KENSINGTON DUERST MRN: [id number], DOB: 03-10-1946, 66 y.o. Date of Encounter: 05/08/2012, 12:43 PM   PROCEDURE NOTE: Verbal consent obtained. Sterile technique employed. Numbing: Anesthesia obtained with 1:1 ratio 2% plain lidocaine with 0.5% Marcaine  Betadine prep per usual protocol.  Total nail lifted without difficulty and removed in total. Proximal aspect of nail bed explored revealing no nail remnants. Nail bed cleaned. No laceration revealed.  Clotted blood removed. Xeroform dressing applied. Wound care instructions including precautions covered with patient. Handout given.   SignedEula Listen, PA-C 05/08/2012 12:43 PM

## 2013-11-01 ENCOUNTER — Other Ambulatory Visit (INDEPENDENT_AMBULATORY_CARE_PROVIDER_SITE_OTHER): Payer: Medicare Other

## 2013-11-01 ENCOUNTER — Ambulatory Visit (INDEPENDENT_AMBULATORY_CARE_PROVIDER_SITE_OTHER): Payer: Medicare Other | Admitting: Physician Assistant

## 2013-11-01 ENCOUNTER — Encounter: Payer: Self-pay | Admitting: Physician Assistant

## 2013-11-01 VITALS — BP 150/90 | HR 60 | Temp 98.3°F | Wt 201.0 lb

## 2013-11-01 DIAGNOSIS — Z0000 Encounter for general adult medical examination without abnormal findings: Secondary | ICD-10-CM

## 2013-11-01 DIAGNOSIS — I10 Essential (primary) hypertension: Secondary | ICD-10-CM

## 2013-11-01 DIAGNOSIS — K5730 Diverticulosis of large intestine without perforation or abscess without bleeding: Secondary | ICD-10-CM

## 2013-11-01 DIAGNOSIS — Z23 Encounter for immunization: Secondary | ICD-10-CM

## 2013-11-01 DIAGNOSIS — K5790 Diverticulosis of intestine, part unspecified, without perforation or abscess without bleeding: Secondary | ICD-10-CM | POA: Insufficient documentation

## 2013-11-01 DIAGNOSIS — N400 Enlarged prostate without lower urinary tract symptoms: Secondary | ICD-10-CM

## 2013-11-01 LAB — BASIC METABOLIC PANEL
BUN: 13 mg/dL (ref 6–23)
CALCIUM: 9.7 mg/dL (ref 8.4–10.5)
CHLORIDE: 105 meq/L (ref 96–112)
CO2: 31 mEq/L (ref 19–32)
CREATININE: 1 mg/dL (ref 0.4–1.5)
GFR: 95.12 mL/min (ref >60.00)
Glucose, Bld: 96 mg/dL (ref 70–99)
Potassium: 4.2 mEq/L (ref 3.5–5.1)
SODIUM: 140 meq/L (ref 135–145)

## 2013-11-01 LAB — CBC WITH DIFFERENTIAL/PLATELET
BASOS ABS: 0 10*3/uL (ref 0.0–0.1)
Basophils Relative: 0.5 % (ref 0.0–3.0)
EOS ABS: 0.1 10*3/uL (ref 0.0–0.7)
Eosinophils Relative: 2.4 % (ref 0.0–5.0)
HEMATOCRIT: 41.8 % (ref 39.0–52.0)
Hemoglobin: 13.8 g/dL (ref 13.0–17.0)
LYMPHS ABS: 2.6 10*3/uL (ref 0.7–4.0)
Lymphocytes Relative: 51.4 % — ABNORMAL HIGH (ref 12.0–46.0)
MCHC: 32.9 g/dL (ref 30.0–36.0)
MCV: 95.4 fl (ref 78.0–100.0)
MONO ABS: 0.5 10*3/uL (ref 0.1–1.0)
Monocytes Relative: 10.7 % (ref 3.0–12.0)
NEUTROS PCT: 35 % — AB (ref 43.0–77.0)
Neutro Abs: 1.7 10*3/uL (ref 1.4–7.7)
PLATELETS: 165 10*3/uL (ref 150.0–400.0)
RBC: 4.39 Mil/uL (ref 4.22–5.81)
RDW: 12.9 % (ref 11.5–14.6)
WBC: 5 10*3/uL (ref 4.5–10.5)

## 2013-11-01 LAB — URINALYSIS, ROUTINE W REFLEX MICROSCOPIC
BILIRUBIN URINE: NEGATIVE
Ketones, ur: NEGATIVE
LEUKOCYTES UA: NEGATIVE
NITRITE: NEGATIVE
RBC / HPF: NONE SEEN (ref 0–?)
Specific Gravity, Urine: >=1.03 — AB (ref 1.000–1.030)
TOTAL PROTEIN, URINE-UPE24: NEGATIVE
UROBILINOGEN UA: 0.2 (ref 0.0–1.0)
Urine Glucose: NEGATIVE
pH: 6 (ref 5.0–8.0)

## 2013-11-01 LAB — LIPID PANEL
CHOL/HDL RATIO: 3
Cholesterol: 175 mg/dL (ref 0–200)
HDL: 64.8 mg/dL (ref >39.00)
LDL Cholesterol: 93 mg/dL (ref 0–99)
TRIGLYCERIDES: 85 mg/dL (ref 0.0–149.0)
VLDL: 17 mg/dL (ref 0.0–40.0)

## 2013-11-01 LAB — TSH: TSH: 0.76 u[IU]/mL (ref 0.35–5.50)

## 2013-11-01 LAB — HEPATIC FUNCTION PANEL
ALBUMIN: 4 g/dL (ref 3.5–5.2)
ALK PHOS: 68 U/L (ref 39–117)
ALT: 22 U/L (ref 0–53)
AST: 18 U/L (ref 0–37)
Bilirubin, Direct: 0.1 mg/dL (ref 0.0–0.3)
TOTAL PROTEIN: 7 g/dL (ref 6.0–8.3)
Total Bilirubin: 0.7 mg/dL (ref 0.3–1.2)

## 2013-11-01 LAB — PSA: PSA: 1.92 ng/mL (ref 0.10–4.00)

## 2013-11-01 MED ORDER — HYDROCHLOROTHIAZIDE 25 MG PO TABS: 25.0000 mg | ORAL_TABLET | Freq: Every day | ORAL | Status: DC

## 2013-11-01 NOTE — Patient Instructions (Signed)
It was great meeting you today [name]!  Labs have been ordered for you, when you report to lab please be fasting.   I have sent a prescription for Hydrochlorothiazide to your preferred pharmacy.  Would like you to return to the office in three months for a blood pressure evaluation.   Health Maintenance, Males A healthy lifestyle and preventative care can promote health and wellness.  Maintain regular health, dental, and eye exams.  Eat a healthy diet. Foods like vegetables, fruits, whole grains, low-fat dairy products, and lean protein foods contain the nutrients you need and are low in calories. Decrease your intake of foods high in solid fats, added sugars, and salt. Get information about a proper diet from your health care provider, if necessary.  Regular physical exercise is one of the most important things you can do for your health. Most adults should get at least 150 minutes of moderate-intensity exercise (any activity that increases your heart rate and causes you to sweat) each week. In addition, most adults need muscle-strengthening exercises on 2 or more days a week.   Maintain a healthy weight. The body mass index (BMI) is a screening tool to identify possible weight problems. It provides an estimate of body fat based on height and weight. Your health care provider can find your BMI and can help you achieve or maintain a healthy weight. For males 20 years and older:  A BMI below 18.5 is considered underweight.  A BMI of 18.5 to 24.9 is normal.  A BMI of 25 to 29.9 is considered overweight.  A BMI of 30 and above is considered obese.  Maintain normal blood lipids and cholesterol by exercising and minimizing your intake of saturated fat. Eat a balanced diet with plenty of fruits and vegetables. Blood tests for lipids and cholesterol should begin at age 54 and be repeated every 5 years. If your lipid or cholesterol levels are high, you are over 50, or you are at high risk  for heart disease, you may need your cholesterol levels checked more frequently.Ongoing high lipid and cholesterol levels should be treated with medicines, if diet and exercise are not working.  If you smoke, find out from your health care provider how to quit. If you do not use tobacco, do not start.  Lung cancer screening is recommended for adults aged 91 80 years who are at high risk for developing lung cancer because of a history of smoking. A yearly low-dose CT scan of the lungs is recommended for people who have at least a 30-pack-year history of smoking and are a current smoker or have quit within the past 15 years. A pack year of smoking is smoking an average of 1 pack of cigarettes a day for 1 year (for example, a 30-pack-year history of smoking could mean smoking 1 pack a day for 30 years or 2 packs a day for 15 years). Yearly screening should continue until the smoker has stopped smoking for at least 15 years. Yearly screening should be stopped for people who develop a health problem that would prevent them from having lung cancer treatment.  If you choose to drink alcohol, do not have more than 2 drinks per day. One drink is considered to be 12 oz (360 mL) of beer, 5 oz (150 mL) of wine, or 1.5 oz (45 mL) of liquor.  Avoid use of street drugs. Do not share needles with anyone. Ask for help if you need support or instructions about stopping  the use of drugs.  High blood pressure causes heart disease and increases the risk of stroke. Blood pressure should be checked at least every 1 2 years. Ongoing high blood pressure should be treated with medicines if weight loss and exercise are not effective.  If you are 84 66 years old, ask your health care provider if you should take aspirin to prevent heart disease.  Diabetes screening involves taking a blood sample to check your fasting blood sugar level. This should be done once every 3 years after age 44, if you are at a normal weight and without  risk factors for diabetes. Testing should be considered at a younger age or be carried out more frequently if you are overweight and have at least 1 risk factor for diabetes.  Colorectal cancer can be detected and often prevented. Most routine colorectal cancer screening begins at the age of 53 and continues through age 52. However, your health care provider may recommend screening at an earlier age if you have risk factors for colon cancer. On a yearly basis, your health care provider may provide home test kits to check for hidden blood in the stool. A small camera at the end of a tube may be used to directly examine the colon (sigmoidoscopy or colonoscopy) to detect the earliest forms of colorectal cancer. Talk to your health care provider about this at age 48, when routine screening begins. A direct exam of the colon should be repeated every 5 10 years through age 81, unless early forms of pre-cancerous polyps or small growths are found.  People who are at an increased risk for hepatitis B should be screened for this virus. You are considered at high risk for hepatitis B if:  You were born in a country where hepatitis B occurs often. Talk with your health care provider about which countries are considered high-risk.  Your parents were born in a high-risk country and you have not received a shot to protect against hepatitis B (hepatitis B vaccine).  You have HIV or AIDS.  You use needles to inject street drugs.  You live with, or have sex with, someone who has hepatitis B.  You are a man who has sex with other men (MSM).  You get hemodialysis treatment.  You take certain medicines for conditions like cancer, organ transplantation, and autoimmune conditions.  Hepatitis C blood testing is recommended for all people born from [birth year] through 1963 and any individual with known risk factors for hepatitis C.  Healthy men should no longer receive prostate-specific antigen (PSA) blood tests as part of  routine cancer screening. Talk to your health care provider about prostate cancer screening.  Testicular cancer screening is not recommended for adolescents or adult males who have no symptoms. Screening includes self-exam, a health care provider exam, and other screening tests. Consult with your health care provider about any symptoms you have or any concerns you have about testicular cancer.  Practice safe sex. Use condoms and avoid high-risk sexual practices to reduce the spread of sexually transmitted infections (STIs).  Use sunscreen. Apply sunscreen liberally and repeatedly throughout the day. You should seek shade when your shadow is shorter than you. Protect yourself by wearing long sleeves, pants, a wide-brimmed hat, and sunglasses year round, whenever you are outdoors.  Tell your health care provider of new moles or changes in moles, especially if there is a change in shape or color. Also tell your provider if a mole is larger than the size of  a pencil eraser.  A one-time screening for abdominal aortic aneurysm (AAA) and surgical repair of large AAAs by ultrasound is recommended for men aged 45 75 years who are current or former smokers.  Stay current with your vaccines (immunizations). Document Released: 02/22/2008 Document Revised: 06/16/2013 Document Reviewed: 01/21/2011 [facility] Patient Information 2014 [place], [place].  Hypertension Hypertension is another name for high blood pressure. High blood pressure may mean that your heart needs to work harder to pump blood. Blood pressure consists of two numbers, which includes a higher number over a lower number (example: 110/72). HOME CARE   Make lifestyle changes as told by your doctor. This may include weight loss and exercise.  Take your blood pressure medicine every day.  Limit how much salt you use.  Stop smoking if you smoke.  Do not use drugs.  Talk to your doctor if you are using decongestants or birth control pills.  These medicines might make blood pressure higher.  Females should not drink more than 1 alcoholic drink per day. Males should not drink more than 2 alcoholic drinks per day.  See your doctor as told. GET HELP RIGHT AWAY IF:   You have a blood pressure reading with a top number of 180 or higher.  You get a very bad headache.  You get blurred or changing vision.  You feel confused.  You feel weak, numb, or faint.  You get chest or belly (abdominal) pain.  You throw up (vomit).  You cannot breathe very well. MAKE SURE YOU:   Understand these instructions.  Will watch your condition.  Will get help right away if you are not doing well or get worse. Document Released: 02/12/2008 Document Revised: 11/18/2011 Document Reviewed: 02/12/2008 [facility] Patient Information 2014 [place], [place].

## 2013-11-01 NOTE — Assessment & Plan Note (Signed)
History of HTN, non medicated for last 4-5 months. today's readings 160/90 and 150/90 Rx for HCTZ 25 mg one tab daily RTO in three months for evaluation

## 2013-11-01 NOTE — Progress Notes (Signed)
Pre-visit discussion using our clinic review tool. No additional management support is needed unless otherwise documented below in the visit note.  

## 2013-11-01 NOTE — Progress Notes (Signed)
Patient ID: Terry Russo is a 68 y.o. male DOB: [phone] MRN: [id number]     HPI:  Patient is a 68 year old male who presents to the office to establish care and have yearly physical exam. Patient is accompanied by his wife of 92 years. Patient reports he has had history of hypertension however has not been on any medications for the last 4-5 months. Reports a history of depression which he use to treat with Zoloft however does not feel he needs it now and his wife agrees with him. Denies other concerns at this time. Denies change in bowel/bladder habits, bloody, tarry stool, chest pain/palpitations, SOB, chest tightening, extremity swelling, appetite/activity changes, visual change or disturbance (blind in right eye for years), pain difficulty swallowing. Denies lightheaded, dizzy, HA, numbness, weakness or tingling.     Influenza:no Pneumonia: 2/13 Tetanus:unknown Eye Dr. 6/14 Dentist scheduling Colonoscopy: 2013  ROS: As stated in HPI. All other systems negative  Past Medical History  Diagnosis Date  . Hypertension   . Right eye injury     pt has glass eye  . Chicken pox    Family History  Problem Relation Age of Onset  . Hypertension Sister   . Hypertension Sister   . Diabetes Brother    History   Social History  . Marital Status: Married    Spouse Name: N/A    Number of Children: 7  . Years of Education: N/A   Occupational History  . welder---retired    Social History Main Topics  . Smoking status: Former Smoker -- 1.50 packs/day for 20 years    Types: Cigarettes    Quit date: 10/11/2003  . Smokeless tobacco: Never Used  . Alcohol Use: No     Comment: half to a whole beer - occasionally (social)  . Drug Use: No  . Sexual Activity: None   Other Topics Concern  . None   Social History Narrative  . None   Past Surgical History  Procedure Laterality Date  . Amputation finger / thumb      left  . Abdominal surgery    . Hernia repair    .  Enucleation      glass eye  . Colonoscopy  10/31/2011    Procedure: COLONOSCOPY;  Surgeon: Owens Loffler, MD;  Location: [facility];  Service: Endoscopy;  Laterality: N/A;   Current Outpatient Prescriptions on File Prior to Visit  Medication Sig Dispense Refill  . aspirin 81 MG tablet Take 81 mg by mouth daily.      . Multiple Vitamin (DAILY-VITAMIN) TABS Take 1 tablet by mouth daily.       . peg 3350 powder (MOVIPREP) 100 G SOLR Take 1 kit (100 g total) by mouth as directed. See written handout  1 kit  0  . sertraline (ZOLOFT) 50 MG tablet Take 50 mg by mouth every other day.       . verapamil (VERELAN PM) 120 MG 24 hr capsule Take 120 mg by mouth every other day.        No current facility-administered medications on file prior to visit.   No Known Allergies  PE: CONSTITUTIONAL: Well developed, well nourished, pleasant, appears stated age, in NAD HEENT: normocephalic, atraumatic, bilateral ext/int canals normal. Bilateral TM's without injections, bulging, erythema. Nose normal, uvula midline, oropharynx clear and moist. EYES: left pupil reactive to light, with EOM intact. Conjunctiva normal without icterus bilateral NECK: FROM, supple, without thyromegaly or mass, no carotid bruits CARDIO: RRR,  normal S1 and S2, distal pulses intact. PULM/CHEST CTA bilateral, no wheezes, rales or rhonchi. Non tender. ABD: appearance normal, soft, nontender. Normal bowel sounds x 4 quadrants, non palpable liver, spleen or kidney PP:CMRXEVL and testicles non tender, no anal fissures or hemorrhoids noted. Prostate symmetrical, boggy, non tender, without palpable nodules. Exam chaperoned by Ival Bible, CMA MUSC: FROM U/LE bilateral, thoracic and lumbar spine with FROM LYMPH: no cervical, inguinal or supraclavicular adenopathy,  NEURO: alert and oriented x 3, no cranial nerve deficit, motor strength and coordination NL. Negative romberg. DTR's intact. Gait normal. SKIN: warm, dry, no rash or lesions  noted. PSYCH: Mood and affect normal, speech normal.   Lab Results  Component Value Date   WBC 10.1 10/18/2011   HGB 14.5 10/18/2011   HCT 42.6 10/18/2011   PLT 298 10/18/2011   GLUCOSE 97 10/18/2011   CHOL 143 10/18/2011   TRIG 70 10/18/2011   HDL 49 10/18/2011   LDLCALC 80 10/18/2011   ALT 22 10/18/2011   AST 19 10/18/2011   NA 141 10/18/2011   K 3.9 10/18/2011   CL 104 10/18/2011   CREATININE 1.01 10/18/2011   BUN 19 10/18/2011   CO2 23 10/18/2011   TSH 1.006 10/18/2011   Filed Vitals:   11/01/13 1126  BP: 150/90  Pulse:   Temp:      ECG today: sinus bradycardia, no acute changes, rate 55 bpm   ASSESSMENT and PLAN   CPX/v70.0 - Patient has been counseled on age-appropriate routine health concerns for screening and prevention. These are reviewed and up-to-date. Immunizations are up-to-date or declined. Labs and ECG reviewed.  HM: influenza and Tdap vaccines today  HTN History of HTN, non medicated. today's readings 160/90 and 150/90 Rx for HCTZ 25 mg one tab daily RTO in three months for evaluation

## 2014-01-05 IMAGING — CT CT ABD-PELV W/ CM
2 of 5 series · 16 of 46 positions shown, 18 images · IV contrast (CONTRAST)
Comparison: None.

CLINICAL DATA: Right lower quadrant abdominal pain

CT ABDOMEN AND PELVIS WITH CONTRAST
TECHNIQUE: Multidetector CT imaging of the abdomen and pelvis was
performed following the standard protocol during bolus
administration of intravenous contrast.
Contrast: 100mL OMNIPAQUE IOHEXOL 300 MG/ML IV SOLN

[Series 2: routine · axial · 0.73mm/px · z∈[-507,-82]mm · 13 of 97 slices shown, 15 images]
[im 6/97  soft-tissue]
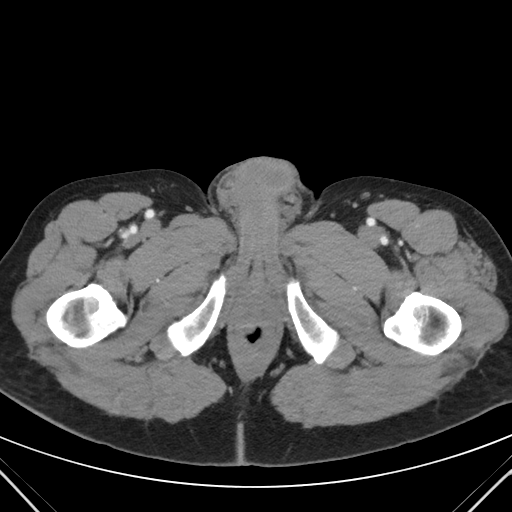
[im 6/97  bone]
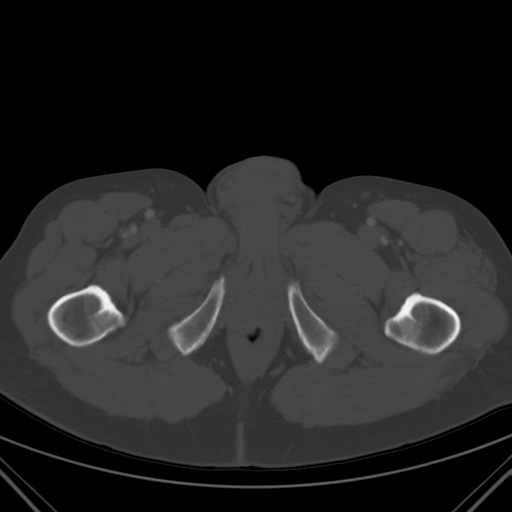
[im 11/97  soft-tissue]
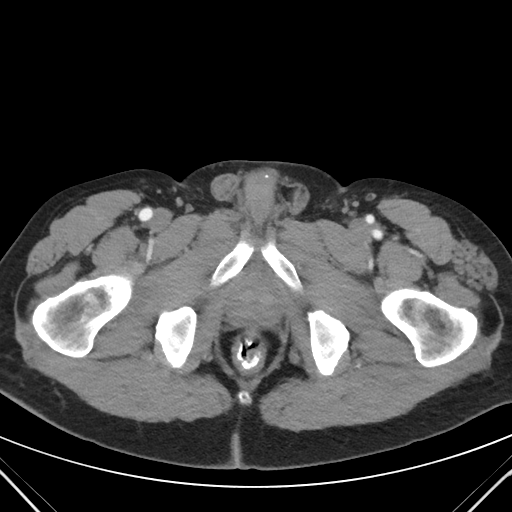
[im 22/97  soft-tissue]
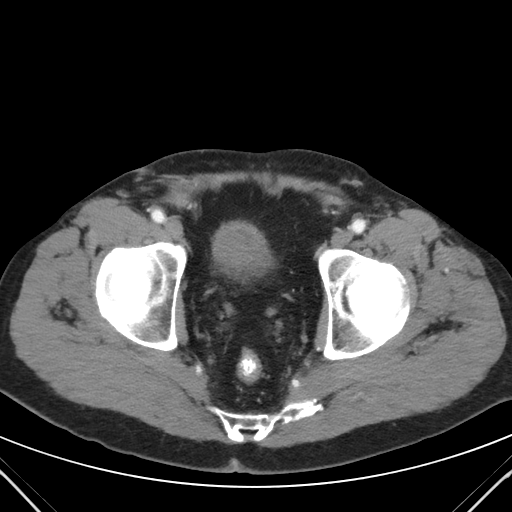
[im 27/97  soft-tissue]
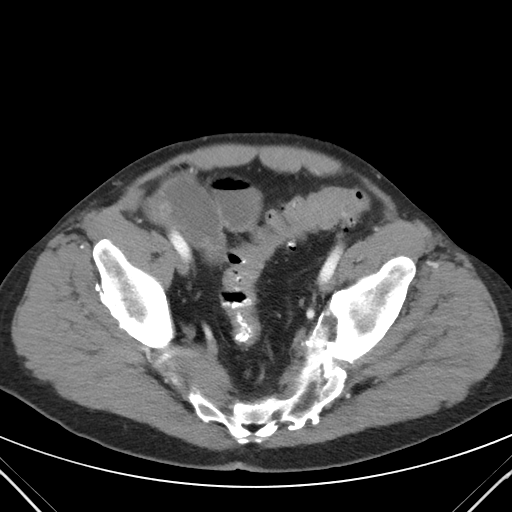
[im 33/97  soft-tissue]
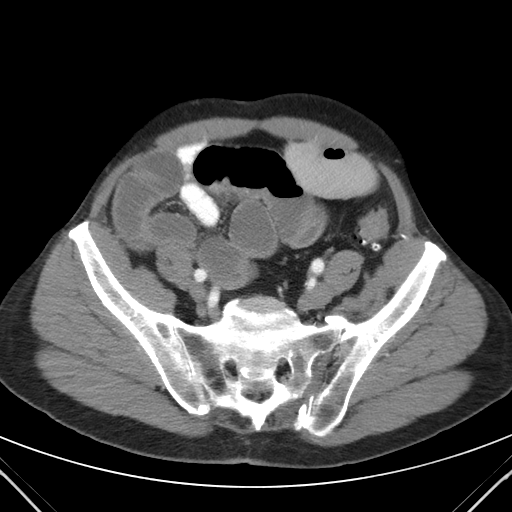
[im 43/97  soft-tissue]
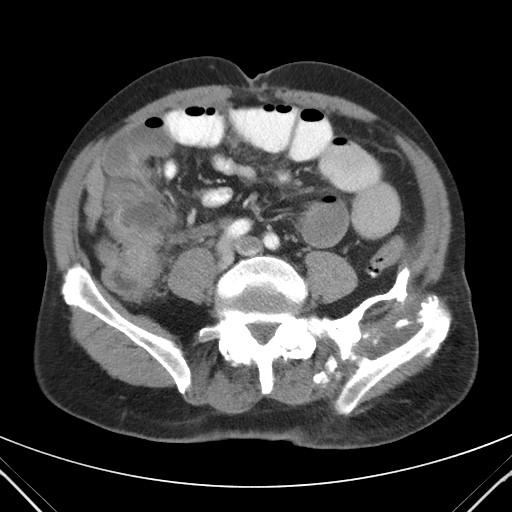
[im 49/97  soft-tissue]
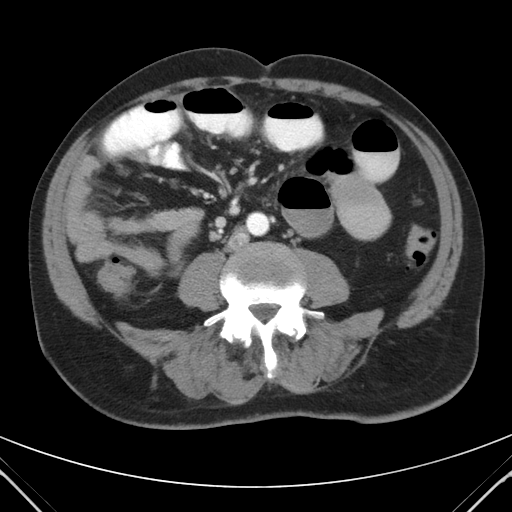
[im 54/97  soft-tissue]
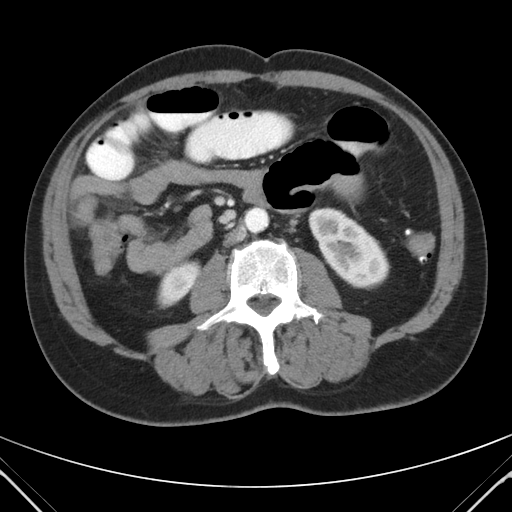
[im 65/97  soft-tissue]
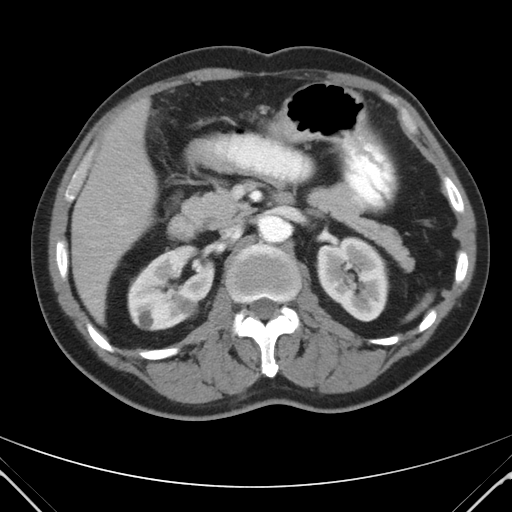
[im 65/97  bone]
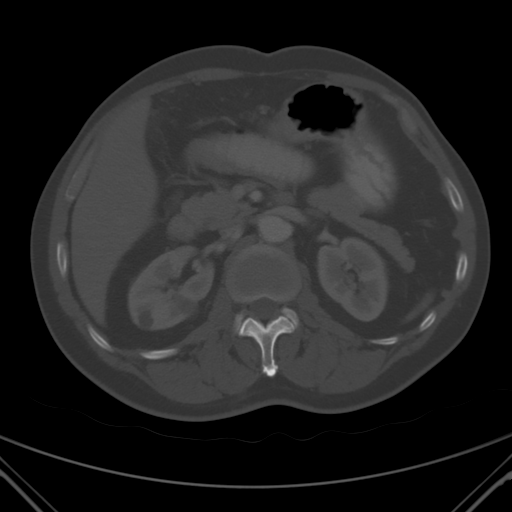
[im 70/97  soft-tissue]
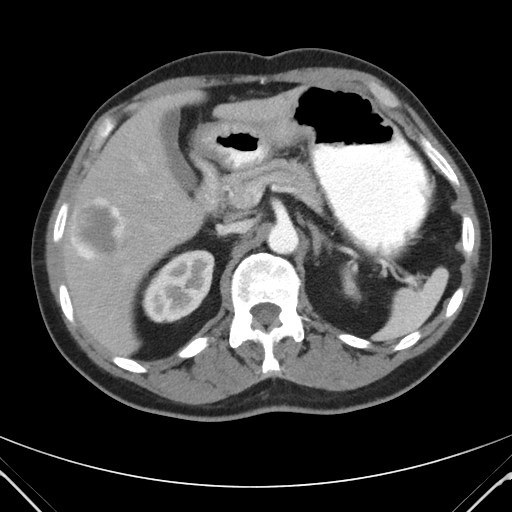
[im 75/97  soft-tissue]
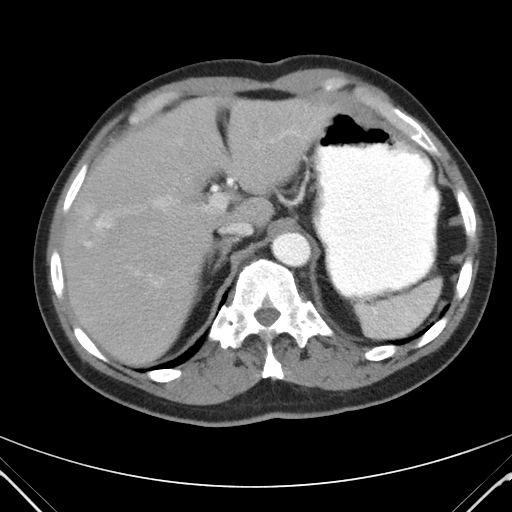
[im 86/97  soft-tissue]
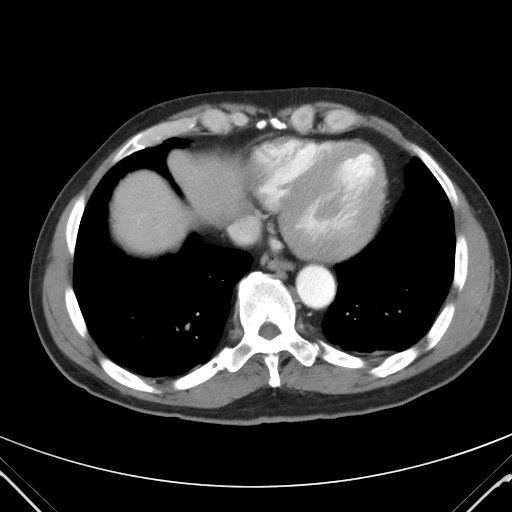
[im 91/97  soft-tissue]
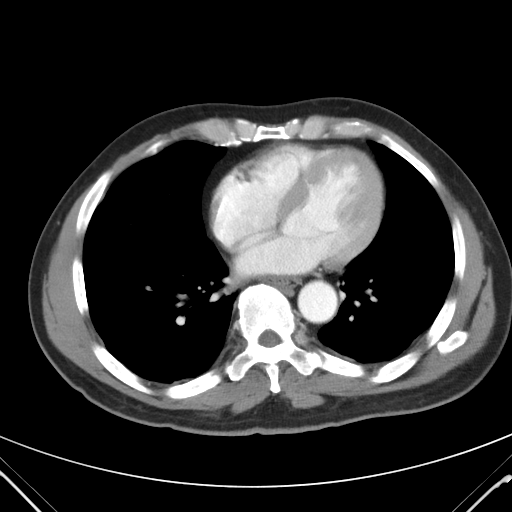

[cor · coronal · 0.94mm/px · 3 of 115 slices shown]
[im 39/115  soft-tissue]
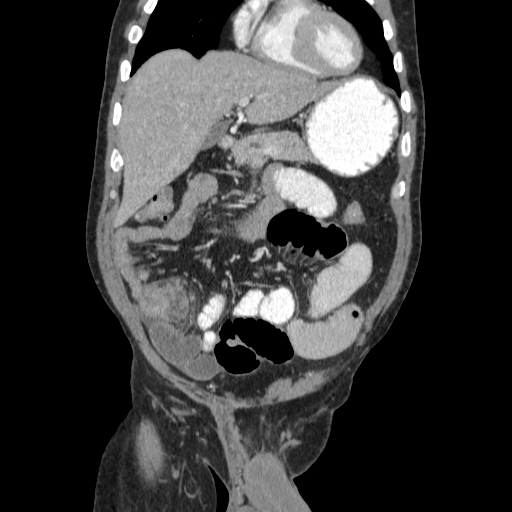
[im 51/115  soft-tissue]
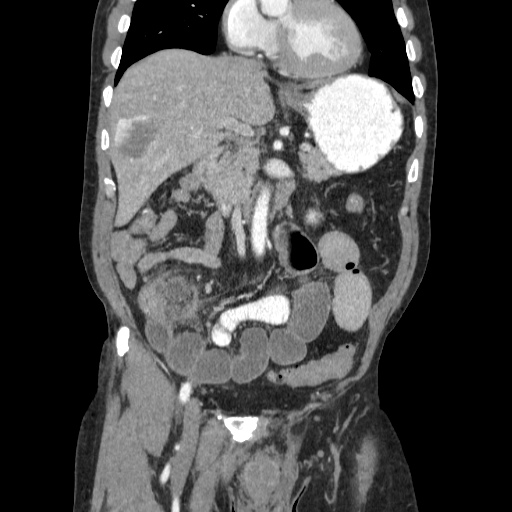
[im 64/115  soft-tissue]
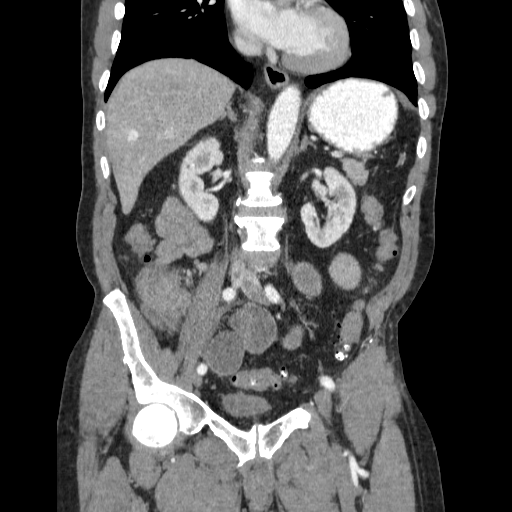

[16 of 46 positions shown; findings below may reference images not displayed]

FINDINGS: Minimal atelectasis in the bilateral lower lobes.

4.9 x 4.3 cm hemangioma in the inferior right hepatic lobe (series
2/image 27).  Additional subcentimeter hypoenhancing lesion in the
posterior right hepatic dome (series 2/image 21), incompletely
characterized.

Spleen, pancreas, and adrenal glands are within normal limits.

Gallbladder is unremarkable.  No intrahepatic or extrahepatic
ductal dilatation.

Bilateral renal cysts.  No hydronephrosis.

Mildly dilated loops of the proximal/mid small bowel leading to a
inflammatory changes/wall thickening in the right lower abdomen
(series 2/image 57).  This is favored to reflect abnormal terminal
ileum with an associated 2.6 x 2.6 cm fluid collection medially
(series 2/image 57).  The cecum may also be involved (series
2/image 56). The appendix runs anterior to the psoas and appears
relatively uninvolved (series 2/image 56).  Extensive colonic
diverticulosis without definite associated inflammatory changes.

Atherosclerotic calcifications of the abdominal aorta and branch
vessels.

No abdominopelvic ascites.

No suspicious abdominopelvic lymphadenopathy.

Prostate is unremarkable.

Bladder is thick-walled but underdistended.

Degenerative changes of the visualized thoracolumbar spine.
Deformity of the left sacrum/iliac bone, likely post-traumatic.
IMPRESSION: Inflammatory process with wall thickening in the right lower
abdomen, favored to reflect abnormal terminal ileum, with adjacent
2.6 cm fluid collection.  This appearance suggests infectious or
inflammatory enteritis such as Crohn's disease.

Possible involvement of the cecum.  Given the abnormal appearance,
colonoscopy is suggested to exclude underlying cecal mass.

The appendix appears relatively uninvolved.

Dilated loops of proximal/mid small bowel, favored to reflect
secondary adynamic ileus, less likely early/partial small bowel
obstruction.

## 2014-02-10 ENCOUNTER — Telehealth: Payer: Self-pay | Admitting: Internal Medicine

## 2014-02-10 ENCOUNTER — Encounter: Payer: Self-pay | Admitting: Internal Medicine

## 2014-02-10 ENCOUNTER — Ambulatory Visit (INDEPENDENT_AMBULATORY_CARE_PROVIDER_SITE_OTHER): Payer: Medicare Other | Admitting: Internal Medicine

## 2014-02-10 VITALS — BP 152/90 | HR 56 | Temp 98.1°F | Ht 69.0 in | Wt 193.0 lb

## 2014-02-10 DIAGNOSIS — F32A Depression, unspecified: Secondary | ICD-10-CM

## 2014-02-10 DIAGNOSIS — F3289 Other specified depressive episodes: Secondary | ICD-10-CM

## 2014-02-10 DIAGNOSIS — F329 Major depressive disorder, single episode, unspecified: Secondary | ICD-10-CM

## 2014-02-10 DIAGNOSIS — I10 Essential (primary) hypertension: Secondary | ICD-10-CM

## 2014-02-10 MED ORDER — LISINOPRIL-HYDROCHLOROTHIAZIDE 20-25 MG PO TABS: 1.0000 | ORAL_TABLET | Freq: Every day | ORAL | Status: DC

## 2014-02-10 NOTE — Assessment & Plan Note (Signed)
Resolved

## 2014-02-10 NOTE — Progress Notes (Signed)
HPI  Pt presents to the clinic today to establish care. He is transferring care from [facility]. He has no concerns today.  Flu: 10/2013 Tetanus: 10/2013 Pneumovax: 2013 Colon screening: 2013 PSA screening: 11/2013 Eye Doctor: yearly Dentist: biannually  Past Medical History  Diagnosis Date  . Hypertension   . Right eye injury     pt has glass eye  . Chicken pox     Current Outpatient Prescriptions  Medication Sig Dispense Refill  . aspirin 81 MG tablet Take 81 mg by mouth daily.      . hydrochlorothiazide (HYDRODIURIL) 25 MG tablet Take 1 tablet (25 mg total) by mouth daily.  90 tablet  1  . Multiple Vitamin (DAILY-VITAMIN) TABS Take 1 tablet by mouth daily.       . sertraline (ZOLOFT) 50 MG tablet Take 50 mg by mouth every other day.       . verapamil (VERELAN PM) 120 MG 24 hr capsule Take 120 mg by mouth every other day.        No current facility-administered medications for this visit.    No Known Allergies  Family History  Problem Relation Age of Onset  . Hypertension Sister   . Hypertension Sister   . Diabetes Brother   . Cancer Neg Hx   . Heart disease Neg Hx   . Stroke Neg Hx     History   Social History  . Marital Status: Married    Spouse Name: N/A    Number of Children: 7  . Years of Education: N/A   Occupational History  . welder---retired    Social History Main Topics  . Smoking status: Former Smoker -- 1.50 packs/day for 20 years    Types: Cigarettes    Quit date: 10/11/2003  . Smokeless tobacco: Never Used  . Alcohol Use: 0.6 oz/week    1 Cans of beer per week     Comment: half to a whole beer - occasionally (social)  . Drug Use: No  . Sexual Activity: Not on file   Other Topics Concern  . Not on file   Social History Narrative  . No narrative on file    ROS:  Constitutional: Denies fever, malaise, fatigue, headache or abrupt weight changes.  Respiratory: Denies difficulty breathing, shortness of breath, cough or sputum  production.   Cardiovascular: Denies chest pain, chest tightness, palpitations or swelling in the hands or feet.   No other specific complaints in a complete review of systems (except as listed in HPI above).  PE:  BP 152/90  Pulse 56  Temp(Src) 98.1 F (36.7 C) (Oral)  Ht _0  (1.753 m)  Wt 193 lb (87.544 kg)  BMI 28.49 kg/m2  SpO2 98% Wt Readings from Last 3 Encounters:  02/10/14 193 lb (87.544 kg)  11/01/13 201 lb (91.173 kg)  05/08/12 188 lb (85.276 kg)    General: Appears his stated age, well developed, well nourished in NAD. Cardiovascular: Normal rate and rhythm. S1,S2 noted.  No murmur, rubs or gallops noted. No JVD or BLE edema. No carotid bruits noted. Pulmonary/Chest: Normal effort and positive vesicular breath sounds. No respiratory distress. No wheezes, rales or ronchi noted.   BMET    Component Value Date/Time   NA 140 11/01/2013 1159   K 4.2 11/01/2013 1159   CL 105 11/01/2013 1159   CO2 31 11/01/2013 1159   GLUCOSE 96 11/01/2013 1159   BUN 13 11/01/2013 1159   CREATININE 1.0 11/01/2013 1159   CREATININE  1.01 10/18/2011 0937   CALCIUM 9.7 11/01/2013 1159   GFRNONAA >60 09/20/2009 0810   GFRAA  Value: >60        The eGFR has been calculated using the MDRD equation. This calculation has not been validated in all clinical situations. eGFR's persistently <60 mL/min signify possible Chronic Kidney Disease. 09/20/2009 0810    Lipid Panel     Component Value Date/Time   CHOL 175 11/01/2013 1159   TRIG 85.0 11/01/2013 1159   HDL 64.80 11/01/2013 1159   CHOLHDL 3 11/01/2013 1159   VLDL 17.0 11/01/2013 1159   LDLCALC 93 11/01/2013 1159    CBC    Component Value Date/Time   WBC 5.0 11/01/2013 1159   RBC 4.39 11/01/2013 1159   HGB 13.8 11/01/2013 1159   HCT 41.8 11/01/2013 1159   PLT 165.0 11/01/2013 1159   MCV 95.4 11/01/2013 1159   MCH 30.7 10/18/2011 0937   MCHC 32.9 11/01/2013 1159   RDW 12.9 11/01/2013 1159   LYMPHSABS 2.6 11/01/2013 1159   MONOABS 0.5 11/01/2013 1159    EOSABS 0.1 11/01/2013 1159   BASOSABS 0.0 11/01/2013 1159    Hgb A1C No results found for this basename: HGBA1C     Assessment and Plan:

## 2014-02-10 NOTE — Assessment & Plan Note (Signed)
Not controlled on HCTZ Will try lisinopril-hct 20-25  RTC in 1 month to recheck BP

## 2014-02-10 NOTE — Patient Instructions (Addendum)

## 2014-02-10 NOTE — Progress Notes (Signed)
Pre visit review using our clinic review tool, if applicable. No additional management support is needed unless otherwise documented below in the visit note. 

## 2014-02-10 NOTE — Telephone Encounter (Signed)
Relevant patient education assigned to patient using Emmi. ° °

## 2014-02-26 ENCOUNTER — Ambulatory Visit (INDEPENDENT_AMBULATORY_CARE_PROVIDER_SITE_OTHER): Payer: Medicare Other | Admitting: Family Medicine

## 2014-02-26 VITALS — BP 138/86 | HR 42 | Temp 97.8°F | Resp 16 | Ht 69.0 in | Wt 189.6 lb

## 2014-02-26 DIAGNOSIS — R300 Dysuria: Secondary | ICD-10-CM

## 2014-02-26 DIAGNOSIS — R350 Frequency of micturition: Secondary | ICD-10-CM

## 2014-02-26 DIAGNOSIS — R319 Hematuria, unspecified: Secondary | ICD-10-CM

## 2014-02-26 LAB — POCT URINALYSIS DIPSTICK
Bilirubin, UA: NEGATIVE
Glucose, UA: NEGATIVE
KETONES UA: NEGATIVE
LEUKOCYTES UA: NEGATIVE
Nitrite, UA: NEGATIVE
Protein, UA: 30
Spec Grav, UA: 1.02
UROBILINOGEN UA: 0.2
pH, UA: 7

## 2014-02-26 LAB — POCT UA - MICROSCOPIC ONLY
Bacteria, U Microscopic: NEGATIVE
CASTS, UR, LPF, POC: NEGATIVE
CRYSTALS, UR, HPF, POC: NEGATIVE
Epithelial cells, urine per micros: NEGATIVE
Mucus, UA: NEGATIVE
WBC, Ur, HPF, POC: NEGATIVE
Yeast, UA: NEGATIVE

## 2014-02-26 LAB — BASIC METABOLIC PANEL
BUN: 16 mg/dL (ref 6–23)
CO2: 30 mEq/L (ref 19–32)
Calcium: 9.8 mg/dL (ref 8.4–10.5)
Chloride: 103 mEq/L (ref 96–112)
Creat: 0.96 mg/dL (ref 0.50–1.35)
Glucose, Bld: 106 mg/dL — ABNORMAL HIGH (ref 70–99)
Potassium: 4.3 mEq/L (ref 3.5–5.3)
SODIUM: 140 meq/L (ref 135–145)

## 2014-02-26 NOTE — Progress Notes (Signed)
Subjective:    Terry Russo is a 68 y.o. male who presents to [facility] today for penile swelling and hematuria:  1.  Penile swelling and hematuria:  Patient last had sexual intercourse with wife on Sunday.  Noticed itching in groin on Monday.  Wife gave him prescription of Monistat which she had for yeast infection.  He used this Tuesday and Wednesday.  Starting Wed PM he noted swelling of skin around glans of penis.  Itching resolved with use of Monistat.  Swelling did not improve, no further use of Monistat.    This AM, he awoke and noted several drops of blood after he finished voiding.  No pain, dysuria, testicular swelling or erythema.  No hematuria prior to today.  No urgency or frequency.    ROS as above per HPI, otherwise neg.   The following portions of the patient's history were reviewed and updated as appropriate: allergies, current medications, past medical history, family and social history, and problem list. Patient is a nonsmoker.    PMH reviewed.  Past Medical History  Diagnosis Date  . Hypertension   . Right eye injury     pt has glass eye  . Chicken pox    Past Surgical History  Procedure Laterality Date  . Amputation finger / thumb      left  . Abdominal surgery    . Hernia repair    . Enucleation      glass eye  . Colonoscopy  10/31/2011    Procedure: COLONOSCOPY;  Surgeon: Owens Loffler, MD;  Location: [facility];  Service: Endoscopy;  Laterality: N/A;    Medications reviewed. Current Outpatient Prescriptions  Medication Sig Dispense Refill  . aspirin 81 MG tablet Take 81 mg by mouth daily.      Marland Kitchen lisinopril-hydrochlorothiazide (PRINZIDE,ZESTORETIC) 20-25 MG per tablet Take 1 tablet by mouth daily.  30 tablet  0  . Multiple Vitamin (DAILY-VITAMIN) TABS Take 1 tablet by mouth daily.        No current facility-administered medications for this visit.     Objective:   Physical Exam BP 138/86  Pulse 42  Temp(Src) 97.8 F (36.6 C) (Oral)  Resp 16  Ht  5\' 9"  (1.753 m)  Wt 189 lb 9.6 oz (86.002 kg)  BMI 27.99 kg/m2  SpO2 98% Gen:  Alert, cooperative patient who appears stated age in no acute distress.  Vital signs reviewed. Eyes:  Glass eye Right side.   Abd:  Soft/nondistended/nontender.  Good bowel sounds throughout all four quadrants.  No masses noted.  GU:  Circumcised male.  Has area of edema in subcutaneous skin immediately surrounding the glans.  No areas of abrasion noted.  Extends about halfway length of penis.  No erythema of penis.  No discharge noted.  No balanitis noted.  No testicular swelling, tenderness, redness noted.  No hernias BL.    No results found for this or any previous visit (from the past 72 hour(s)).  Results for orders placed in visit on 02/26/14  POCT UA - MICROSCOPIC ONLY      Result Value Ref Range   WBC, Ur, HPF, POC neg     RBC, urine, microscopic 6-15     Bacteria, U Microscopic neg     Mucus, UA neg     Epithelial cells, urine per micros neg     Crystals, Ur, HPF, POC neg     Casts, Ur, LPF, POC neg     Yeast, UA neg    POCT  URINALYSIS DIPSTICK      Result Value Ref Range   Color, UA yellow     Clarity, UA cloudy     Glucose, UA neg     Bilirubin, UA neg     Ketones, UA neg     Spec Grav, UA 1.020     Blood, UA moderate     pH, UA 7.0     Protein, UA 30     Urobilinogen, UA 0.2     Nitrite, UA neg     Leukocytes, UA Negative      Imp/Plan: 1.  Allergic reaction: - patient with allergic reaction to monistat.  Will add to allergies list.  - to stop this.  Start Benadryl and Cetirizine. - FU in 1 week for recheck for improvement.   2.  Gross Hematuria: - Normal PSA 4 months ago - Likely secondary to allergic reaction, but will need recheck to ensure gross hematuria has resolved.   - UA revealed hematuria.  No casts, therefore no signs of renal involvement.   - BMET for renal function today.   - Needs to have repeat UA to ensure resolved hematuria

## 2014-02-26 NOTE — Patient Instructions (Signed)
Take the Benadryl for the next 1-2 days every 8 hours for the swelling.  You can also pick up a medicine called Cetirizine to help with the swelling.  Take this daily for the next several days  This will gradually improve on its own.  Come back in a week to recheck your urine and ensure that the blood has resolved.  If it continues, we will likely need to send you to a urologist.    It was good to meet you today

## 2014-02-28 LAB — GC/CHLAMYDIA PROBE AMP
CT PROBE, AMP APTIMA: NEGATIVE
GC PROBE AMP APTIMA: NEGATIVE

## 2014-03-18 ENCOUNTER — Other Ambulatory Visit: Payer: Self-pay

## 2014-03-18 MED ORDER — LISINOPRIL-HYDROCHLOROTHIAZIDE 20-25 MG PO TABS: 1.0000 | ORAL_TABLET | Freq: Every day | ORAL | Status: DC

## 2014-03-18 NOTE — Telephone Encounter (Signed)
pts wife said that she has upcoming surgery and she needs her husband to help with her care so pt did not schedule f/u appt in July but pt already has appt scheduled with Webb Silversmith NP on 04/19/14. Pt is out of lisinopril HCTZ. Mrs Rosten request refill to [facility] [place].Please advise.

## 2014-03-21 ENCOUNTER — Other Ambulatory Visit: Payer: Self-pay

## 2014-03-21 ENCOUNTER — Ambulatory Visit: Payer: Medicare Other | Admitting: Internal Medicine

## 2014-03-21 MED ORDER — LISINOPRIL-HYDROCHLOROTHIAZIDE 20-25 MG PO TABS: 1.0000 | ORAL_TABLET | Freq: Every day | ORAL | Status: DC

## 2014-03-22 ENCOUNTER — Ambulatory Visit (INDEPENDENT_AMBULATORY_CARE_PROVIDER_SITE_OTHER): Payer: Medicare Other | Admitting: Family Medicine

## 2014-03-22 VITALS — BP 150/90 | HR 62 | Temp 97.3°F | Resp 16 | Ht 68.0 in | Wt 191.8 lb

## 2014-03-22 DIAGNOSIS — B372 Candidiasis of skin and nail: Secondary | ICD-10-CM

## 2014-03-22 DIAGNOSIS — Z5189 Encounter for other specified aftercare: Secondary | ICD-10-CM

## 2014-03-22 DIAGNOSIS — L291 Pruritus scroti: Secondary | ICD-10-CM

## 2014-03-22 DIAGNOSIS — L293 Anogenital pruritus, unspecified: Secondary | ICD-10-CM

## 2014-03-22 DIAGNOSIS — R319 Hematuria, unspecified: Secondary | ICD-10-CM

## 2014-03-22 DIAGNOSIS — S30812D Abrasion of penis, subsequent encounter: Secondary | ICD-10-CM

## 2014-03-22 MED ORDER — NYSTATIN 100000 UNIT/GM EX CREA: 1.0000 "application " | TOPICAL_CREAM | Freq: Two times a day (BID) | CUTANEOUS | Status: DC

## 2014-03-22 NOTE — Patient Instructions (Signed)
Try the new antifungal cream.  Twice per day.  Cetirizine once per day, and over the counter hydrocortisone 1% cream (ask the pharmacist if needed to find this) up to twice per day in itching areas only if needed. Dove soap only to this area. No further cleansers over the counter and avoid dial soap for now as it causes dry skin at times. If not improving in next 4-5 days - return for recheck. Return to the clinic or go to the nearest emergency room if any of your symptoms worsen or new symptoms occur.  Follow up with your primary provider in the next month or two to repeat the urine test to make sure the blood has resolved, and if still present to determine if further evaluation needed.   Yeast Infection of the Skin Some yeast on the skin is normal, but sometimes it causes an infection. If you have a yeast infection, it shows up as white or light brown patches on brown skin. You can see it better in the summer on tan skin. It causes light-colored holes in your suntan. It can happen on any area of the body. This cannot be passed from person to person. HOME CARE  Scrub your skin daily with a dandruff shampoo. Your rash may take a couple weeks to get well.  Do not scratch or itch the rash. GET HELP RIGHT AWAY IF:   You get another infection from scratching. The skin may get warm, red, and may ooze fluid.  The infection does not seem to be getting better. MAKE SURE YOU:  Understand these instructions.  Will watch your condition.  Will get help right away if you are not doing well or get worse. Document Released: 08/08/2008 Document Revised: 11/18/2011 Document Reviewed: 08/08/2008 [facility] Patient Information 2015 [place], [place]. This information is not intended to replace advice given to you by your health care provider. Make sure you discuss any questions you have with your health care provider.

## 2014-03-22 NOTE — Progress Notes (Addendum)
Subjective:  This chart was scribed for Terry Ray, MD, by Neta Ehlers, Medical Scribe. This patient's care was started at 10:57 AM.    Patient ID: Terry Russo, male    DOB: 1946-08-22, 68 y.o.   MRN: [id number]  HPI  Terry Russo is a 68 y.o. male PCP: Webb Silversmith, NP  The pt was seen on June 20 by Dr. Mingo Amber; at that time he had itching around the groin after intercourse the day prior. He treated the itching with Monistat OTC. Two days later, he noticed swelling around glans of penis, but itching had improved with use of Monistat. Diagnosed with probable allergic reaction to Monistat. Recommended Benadryl and Zyrtec, and plan to recheck within one week. Additionally the pt noted gross hematuria; he noted blood after voiding the day of last evaluation. Renal function normal. Normal PSA five months ago in February. GC/chlamydia testing negative at last office visit.   In the office, he reports he has taken Benadryl and Zyrtec for three days. The penile swelling has resolved, but the itching has returned. He reports he has been scratching and clawing at the area, and he has noticed some raw areas since scratching, including one raw area at right side of penile shaft. He has also noticed the itching and rash has spread to the scrotum, but not to the legs or thighs. He denies penile discharge or fevers. He also denies further hematuria. He has been urinating normally.   Patient Active Problem List   Diagnosis Date Noted  . Diverticulosis of colon (without mention of hemorrhage) 10/31/2011  . HTN (hypertension) 10/18/2011  . Depression 10/18/2011   Past Medical History  Diagnosis Date  . Hypertension   . Right eye injury     pt has glass eye  . Chicken pox    Past Surgical History  Procedure Laterality Date  . Amputation finger / thumb      left  . Abdominal surgery    . Hernia repair    . Enucleation      glass eye  . Colonoscopy  10/31/2011    Procedure: COLONOSCOPY;   Surgeon: Owens Loffler, MD;  Location: [facility];  Service: Endoscopy;  Laterality: N/A;   Allergies  Allergen Reactions  . Monistat [Miconazole]    Prior to Admission medications   Medication Sig Start Date End Date Taking? Authorizing Provider  aspirin 81 MG tablet Take 81 mg by mouth daily.   Yes Historical Provider, MD  lisinopril-hydrochlorothiazide (PRINZIDE,ZESTORETIC) 20-25 MG per tablet Take 1 tablet by mouth daily. 03/21/14  Yes Webb Silversmith, NP  Multiple Vitamin (DAILY-VITAMIN) TABS Take 1 tablet by mouth daily.    Yes Historical Provider, MD    Review of Systems  Constitutional: Negative for fever.  Genitourinary: Positive for hematuria (Resolved) and penile swelling (Resolved). Negative for decreased urine volume, discharge and difficulty urinating.  Skin: Positive for rash.       Objective:   Physical Exam  Nursing note and vitals reviewed. Constitutional: He is oriented to person, place, and time. He appears well-developed and well-nourished. No distress.  HENT:  Head: Normocephalic and atraumatic.  Eyes: Conjunctivae and EOM are normal.  Neck: Neck supple.  Cardiovascular: Normal rate.   Pulmonary/Chest: Effort normal. No respiratory distress.  Genitourinary:  No penile swelling. Some dry skin with slight thickening of the scrotum with diffuse excoriation, few raw appearing areas, and one small abrasion on right penile shaft which is superficial. No discharge.  Testicles non-tender.  No rash into inguinal folds. No inguinal lad.   Musculoskeletal: Normal range of motion.  Neurological: He is alert and oriented to person, place, and time.  Skin: Skin is warm and dry.  Psychiatric: He has a normal mood and affect. His behavior is normal.     Filed Vitals:   03/22/14 0935  BP: 150/90  Pulse: 62  Temp: 97.3 F (36.3 C)  TempSrc: Oral  Resp: 16  Height: 5\' 8"  (1.727 m)  Weight: 191 lb 12.8 oz (87 kg)  SpO2: 96%      Assessment & Plan:   [name] is a 68 y.o. male Candidal dermatitis - Plan: nystatin cream (MYCOSTATIN), Scrotal itching, Penile abrasion, subsequent encounter  - suspect some candidal/fungal infection including into scrotum with dry/scaly appearance with secondary abrasion from scratching.  No sign of secondary infection at this time. May have been allergic to carrier in [place].   Start nystatin cream, otc cetirizine and if needed hydrocortisone 1% topical if needed to decrease itching, and Dove Soap. recheck in 4-5 days if not improving. Sooner if worse.   Hematuria - asx at present - plan to follow up with PCP for recheck u/a in next 6-8 weeks. Repeat sooner if any visible hematuria recurs.   rtc precautions discussed.    Meds ordered this encounter  Medications  . nystatin cream (MYCOSTATIN)    Sig: Apply 1 application topically 2 (two) times daily.    Dispense:  30 g    Refill:  0   Patient Instructions  Try the new antifungal cream.  Twice per day.  Cetirizine once per day, and over the counter hydrocortisone 1% cream (ask the pharmacist if needed to find this) up to twice per day in itching areas only if needed. Dove soap only to this area. No further cleansers over the counter and avoid dial soap for now as it causes dry skin at times. If not improving in next 4-5 days - return for recheck. Return to the clinic or go to the nearest emergency room if any of your symptoms worsen or new symptoms occur.  Follow up with your primary provider in the next month or two to repeat the urine test to make sure the blood has resolved, and if still present to determine if further evaluation needed.   Yeast Infection of the Skin Some yeast on the skin is normal, but sometimes it causes an infection. If you have a yeast infection, it shows up as white or light brown patches on brown skin. You can see it better in the summer on tan skin. It causes light-colored holes in your suntan. It can happen on any area of the body.  This cannot be passed from person to person. HOME CARE  Scrub your skin daily with a dandruff shampoo. Your rash may take a couple weeks to get well.  Do not scratch or itch the rash. GET HELP RIGHT AWAY IF:   You get another infection from scratching. The skin may get warm, red, and may ooze fluid.  The infection does not seem to be getting better. MAKE SURE YOU:  Understand these instructions.  Will watch your condition.  Will get help right away if you are not doing well or get worse. Document Released: 08/08/2008 Document Revised: 11/18/2011 Document Reviewed: 08/08/2008 [facility] Patient Information 2015 [place], [place]. This information is not intended to replace advice given to you by your health care provider. Make sure you discuss any questions you have  with your health care provider.  I personally performed the services described in this documentation, which was scribed in my presence. The recorded information has been reviewed and considered, and addended by me as needed.

## 2014-04-19 ENCOUNTER — Ambulatory Visit (INDEPENDENT_AMBULATORY_CARE_PROVIDER_SITE_OTHER): Payer: Medicare Other | Admitting: Internal Medicine

## 2014-04-19 ENCOUNTER — Encounter: Payer: Self-pay | Admitting: Internal Medicine

## 2014-04-19 VITALS — BP 136/84 | HR 55 | Temp 98.1°F | Wt 193.0 lb

## 2014-04-19 DIAGNOSIS — I10 Essential (primary) hypertension: Secondary | ICD-10-CM

## 2014-04-19 DIAGNOSIS — M7710 Lateral epicondylitis, unspecified elbow: Secondary | ICD-10-CM

## 2014-04-19 DIAGNOSIS — M7711 Lateral epicondylitis, right elbow: Secondary | ICD-10-CM

## 2014-04-19 MED ORDER — LISINOPRIL-HYDROCHLOROTHIAZIDE 20-25 MG PO TABS: 1.0000 | ORAL_TABLET | Freq: Every day | ORAL | Status: DC

## 2014-04-19 NOTE — Patient Instructions (Addendum)

## 2014-04-19 NOTE — Progress Notes (Signed)
Subjective:    Patient ID: Terry Russo, male    DOB: [birth year], 68 y.o.   MRN: [id number]  HPI  Pt presents to the clinic today for 1 month follow up of HTN. Lisinopril was added to his HCTZ at his last visit. His blood pressure went from 150/90 to 136/86. He denies any side effects from the medication.  He does c/o pain in his right elbow. This started a few weeks ago. He reports at certain times, it is a sharp pain that radiates down his arm. He denies numbness or tingling in the arm. He denies any injury to the elbow. He has not tried anything OTC for it.  Review of Systems      Past Medical History  Diagnosis Date  . Hypertension   . Right eye injury     pt has glass eye  . Chicken pox     Current Outpatient Prescriptions  Medication Sig Dispense Refill  . aspirin 81 MG tablet Take 81 mg by mouth daily.      Marland Kitchen lisinopril-hydrochlorothiazide (PRINZIDE,ZESTORETIC) 20-25 MG per tablet Take 1 tablet by mouth daily.  30 tablet  0  . Multiple Vitamin (DAILY-VITAMIN) TABS Take 1 tablet by mouth daily.       Marland Kitchen nystatin cream (MYCOSTATIN) Apply 1 application topically 2 (two) times daily.  30 g  0   No current facility-administered medications for this visit.    Allergies  Allergen Reactions  . Monistat [Miconazole]     Family History  Problem Relation Age of Onset  . Hypertension Sister   . Hypertension Sister   . Diabetes Brother   . Cancer Neg Hx   . Heart disease Neg Hx   . Stroke Neg Hx     History   Social History  . Marital Status: Married    Spouse Name: N/A    Number of Children: 7  . Years of Education: N/A   Occupational History  . welder---retired    Social History Main Topics  . Smoking status: Former Smoker -- 1.50 packs/day for 20 years    Types: Cigarettes    Quit date: 10/11/2003  . Smokeless tobacco: Never Used  . Alcohol Use: 0.6 oz/week    1 Cans of beer per week     Comment: half to a whole beer - occasionally (social)  . Drug  Use: No  . Sexual Activity: Not on file   Other Topics Concern  . Not on file   Social History Narrative  . No narrative on file     Constitutional: Denies fever, malaise, fatigue, headache or abrupt weight changes.  Respiratory: Denies difficulty breathing, shortness of breath, cough or sputum production.   Cardiovascular: Denies chest pain, chest tightness, palpitations or swelling in the hands or feet.  MSK: Pt reports right elbow pain. Denies difficulty with range of motion or muscle pain. Neurological: Denies dizziness, difficulty with memory, difficulty with speech or problems with balance and coordination.   No other specific complaints in a complete review of systems (except as listed in HPI above).  Objective:   Physical Exam   BP 136/84  Pulse 55  Temp(Src) 98.1 F (36.7 C) (Oral)  Wt 193 lb (87.544 kg)  SpO2 99% Wt Readings from Last 3 Encounters:  04/19/14 193 lb (87.544 kg)  03/22/14 191 lb 12.8 oz (87 kg)  02/26/14 189 lb 9.6 oz (86.002 kg)    General: Appears his stated age, well developed, well nourished in NAD.  Cardiovascular: Normal rate and rhythm. S1,S2 noted.  No murmur, rubs or gallops noted. No JVD or BLE edema. No carotid bruits noted. Pulmonary/Chest: Normal effort and diminished vesicular breath sounds. No respiratory distress. No wheezes, rales or ronchi noted.  Musculoskeletal: Normal flexion, extension and rotation of the right elbow. Pain with palpation of the lateral epicondyle.   BMET    Component Value Date/Time   NA 140 02/26/2014 1312   K 4.3 02/26/2014 1312   CL 103 02/26/2014 1312   CO2 30 02/26/2014 1312   GLUCOSE 106* 02/26/2014 1312   BUN 16 02/26/2014 1312   CREATININE 0.96 02/26/2014 1312   CREATININE 1.0 11/01/2013 1159   CALCIUM 9.8 02/26/2014 1312   GFRNONAA >60 09/20/2009 0810   GFRAA  Value: >60        The eGFR has been calculated using the MDRD equation. This calculation has not been validated in all clinical situations.  eGFR's persistently <60 mL/min signify possible Chronic Kidney Disease. 09/20/2009 0810    Lipid Panel     Component Value Date/Time   CHOL 175 11/01/2013 1159   TRIG 85.0 11/01/2013 1159   HDL 64.80 11/01/2013 1159   CHOLHDL 3 11/01/2013 1159   VLDL 17.0 11/01/2013 1159   LDLCALC 93 11/01/2013 1159    CBC    Component Value Date/Time   WBC 5.0 11/01/2013 1159   RBC 4.39 11/01/2013 1159   HGB 13.8 11/01/2013 1159   HCT 41.8 11/01/2013 1159   PLT 165.0 11/01/2013 1159   MCV 95.4 11/01/2013 1159   MCH 30.7 10/18/2011 0937   MCHC 32.9 11/01/2013 1159   RDW 12.9 11/01/2013 1159   LYMPHSABS 2.6 11/01/2013 1159   MONOABS 0.5 11/01/2013 1159   EOSABS 0.1 11/01/2013 1159   BASOSABS 0.0 11/01/2013 1159    Hgb A1C No results found for this basename: HGBA1C        Assessment & Plan:   Lateral Epicondylitis:  Try some Ibuprofen OTC Can get a brace from your local drug store Let me know if does not improve

## 2014-04-19 NOTE — Progress Notes (Signed)
Pre visit review using our clinic review tool, if applicable. No additional management support is needed unless otherwise documented below in the visit note. 

## 2014-04-19 NOTE — Assessment & Plan Note (Signed)
Well controlled on current medication  Will refill today  RTC in 6 months to reasses

## 2014-06-06 ENCOUNTER — Encounter: Payer: Self-pay | Admitting: Podiatry

## 2014-06-06 ENCOUNTER — Ambulatory Visit (INDEPENDENT_AMBULATORY_CARE_PROVIDER_SITE_OTHER): Payer: Medicare Other

## 2014-06-06 ENCOUNTER — Ambulatory Visit (INDEPENDENT_AMBULATORY_CARE_PROVIDER_SITE_OTHER): Payer: Medicare Other | Admitting: Podiatry

## 2014-06-06 VITALS — BP 157/98 | HR 59 | Resp 16 | Ht 71.0 in | Wt 185.0 lb

## 2014-06-06 DIAGNOSIS — R52 Pain, unspecified: Secondary | ICD-10-CM

## 2014-06-06 DIAGNOSIS — L84 Corns and callosities: Secondary | ICD-10-CM

## 2014-06-06 DIAGNOSIS — M2040 Other hammer toe(s) (acquired), unspecified foot: Secondary | ICD-10-CM

## 2014-06-06 NOTE — Progress Notes (Signed)
   Subjective:    Patient ID: Terry Russo, male    DOB: 1946-06-16, 68 y.o.   MRN: [id number]  HPI Comments: Both of my little toes hurt. They have been hurting for 3 months. Sometimes they hurt and sometimes they dont. i catch a pain when im sitting or walking. i wash my feet and that's it.  Toe Pain       Review of Systems  All other systems reviewed and are negative.      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 bilaterally  X-ray examination right foot  Intact bony structure without fracture and/or dislocation noted  Slight narrowing of right first MPJ  Possible old healed fracture proximal phalanx fifth toe  Radiographic impression:  No acute bony abnormality noted in the right foot      X-ray examination left foot  Intact bony structure without fracture and/or dislocation  Radiographic impression:  No acute bony abnormality noted in the left foot           Assessment & Plan:   Assessment: Hammertoes fifth bilaterally Listers corn fifth left  Plan: Debrided keratoses fifth left and attached a protective pad Patient advised lesion  will recur and depending on response to palliative care would determine future treatment  Reappoint at patient's request

## 2014-06-07 ENCOUNTER — Encounter: Payer: Self-pay | Admitting: Podiatry

## 2014-09-17 ENCOUNTER — Encounter (HOSPITAL_COMMUNITY): Payer: Self-pay

## 2014-09-17 ENCOUNTER — Emergency Department (HOSPITAL_COMMUNITY): Admission: EM | Admit: 2014-09-17 | Discharge: 2014-09-17 | Discharge status: Absent without leave | Payer: Self-pay

## 2014-09-17 ENCOUNTER — Ambulatory Visit (INDEPENDENT_AMBULATORY_CARE_PROVIDER_SITE_OTHER): Payer: Commercial Managed Care - HMO | Admitting: Emergency Medicine

## 2014-09-17 ENCOUNTER — Ambulatory Visit (HOSPITAL_COMMUNITY)
Admission: RE | Admit: 2014-09-17 | Discharge: 2014-09-17 | Disposition: A | Discharge status: Home / Self Care | Payer: Medicare HMO | Source: Ambulatory Visit | Attending: Emergency Medicine | Admitting: Emergency Medicine

## 2014-09-17 VITALS — BP 136/80 | HR 52 | Temp 97.4°F | Resp 18 | Ht 70.0 in | Wt 194.0 lb

## 2014-09-17 DIAGNOSIS — G8330 Monoplegia, unspecified affecting unspecified side: Secondary | ICD-10-CM

## 2014-09-17 DIAGNOSIS — G8314 Monoplegia of lower limb affecting left nondominant side: Secondary | ICD-10-CM | POA: Diagnosis not present

## 2014-09-17 LAB — POCT CBC
Granulocyte percent: 42.1 %G (ref 37–80)
HEMATOCRIT: 42.1 % — AB (ref 43.5–53.7)
Hemoglobin: 14 g/dL — AB (ref 14.1–18.1)
Lymph, poc: 2.1 (ref 0.6–3.4)
MCH: 31.7 pg — AB (ref 27–31.2)
MCHC: 33.3 g/dL (ref 31.8–35.4)
MCV: 95.1 fL (ref 80–97)
MID (CBC): 0.5 (ref 0–0.9)
MPV: 8.5 fL (ref 0–99.8)
POC Granulocyte: 1.9 — AB (ref 2–6.9)
POC LYMPH %: 47.6 % (ref 10–50)
POC MID %: 10.3 %M (ref 0–12)
Platelet Count, POC: 167 10*3/uL (ref 142–424)
RBC: 4.42 M/uL — AB (ref 4.69–6.13)
RDW, POC: 12.4 %
WBC: 4.5 10*3/uL — AB (ref 4.6–10.2)

## 2014-09-17 LAB — COMPREHENSIVE METABOLIC PANEL
ALBUMIN: 4.3 g/dL (ref 3.5–5.2)
ALK PHOS: 66 U/L (ref 39–117)
ALT: 18 U/L (ref 0–53)
AST: 16 U/L (ref 0–37)
BILIRUBIN TOTAL: 0.7 mg/dL (ref 0.2–1.2)
BUN: 17 mg/dL (ref 6–23)
CHLORIDE: 102 meq/L (ref 96–112)
CO2: 28 meq/L (ref 19–32)
Calcium: 9.7 mg/dL (ref 8.4–10.5)
Creat: 1.07 mg/dL (ref 0.50–1.35)
Glucose, Bld: 98 mg/dL (ref 70–99)
POTASSIUM: 3.9 meq/L (ref 3.5–5.3)
Sodium: 140 mEq/L (ref 135–145)
TOTAL PROTEIN: 7.4 g/dL (ref 6.0–8.3)

## 2014-09-17 LAB — POCT SEDIMENTATION RATE: POCT SED RATE: 10 mm/h (ref 0–22)

## 2014-09-17 NOTE — Progress Notes (Signed)
[facility] [address], [place] [address] [phone]  Date:  09/17/2014   Name:  Terry Russo   DOB:  [birth year]   MRN:  [id number]  PCP:  Webb Silversmith, NP    Chief Complaint: Back Pain and Leg Pain   History of Present Illness:  Terry Russo is a 69 y.o. very pleasant male patient who presents with the following:  Awoke this morning and had pain and limited mobility in his left leg.  Says was "dead" for 15 minutes and then became "normal".   Now has normal sensation and mobility. HBP  No DM.  Non smoker. No history of TIA, CAD, CVA Works full time No improvement with over the counter medications or other home remedies.  Denies other complaint or health concern today.   Patient Active Problem List   Diagnosis Date Noted  . Diverticulosis of colon (without mention of hemorrhage) 10/31/2011  . HTN (hypertension) 10/18/2011  . Depression 10/18/2011    Past Medical History  Diagnosis Date  . Hypertension   . Right eye injury     pt has glass eye  . Chicken pox     Past Surgical History  Procedure Laterality Date  . Amputation finger / thumb      left  . Abdominal surgery    . Hernia repair    . Enucleation      glass eye  . Colonoscopy  10/31/2011    Procedure: COLONOSCOPY;  Surgeon: Owens Loffler, MD;  Location: [facility];  Service: Endoscopy;  Laterality: N/A;    History  Substance Use Topics  . Smoking status: Former Smoker -- 1.50 packs/day for 20 years    Types: Cigarettes    Quit date: 10/11/2003  . Smokeless tobacco: Never Used  . Alcohol Use: 0.6 oz/week    1 Cans of beer per week     Comment: half to a whole beer - occasionally (social)    Family History  Problem Relation Age of Onset  . Hypertension Sister   . Hypertension Sister   . Diabetes Brother   . Cancer Neg Hx   . Heart disease Neg Hx   . Stroke Neg Hx     No Known Allergies  Medication list has been reviewed and updated.  Current Outpatient  Prescriptions on File Prior to Visit  Medication Sig Dispense Refill  . aspirin 81 MG tablet Take 81 mg by mouth daily.    Marland Kitchen lisinopril-hydrochlorothiazide (PRINZIDE,ZESTORETIC) 20-25 MG per tablet Take 1 tablet by mouth daily. 90 tablet 1  . Multiple Vitamin (DAILY-VITAMIN) TABS Take 1 tablet by mouth daily.      No current facility-administered medications on file prior to visit.    Review of Systems:  As per HPI, otherwise negative.    Physical Examination: Filed Vitals:   09/17/14 1024  BP: 136/80  Pulse: 52  Temp: 97.4 F (36.3 C)  Resp: 18   Filed Vitals:   09/17/14 1024  Height: 5\' 10"  (1.778 m)  Weight: 194 lb (87.998 kg)   Body mass index is 27.84 kg/(m^2). Ideal Body Weight: Weight in (lb) to have BMI = 25: 173.9  GEN: WDWN, NAD, Non-toxic, A & O x 3 HEENT: Atraumatic, Normocephalic. Neck supple. No masses, No LAD. Ears and Nose: No external deformity. CV: RRR, No M/G/R. No JVD. No thrill. No extra heart sounds. PULM: CTA B, no wheezes, crackles, rhonchi. No retractions. No resp. distress. No accessory muscle use. ABD:  S, NT, ND, +BS. No rebound. No HSM. EXTR: No c/c/e NEURO Normal gait. CN  2-12 intact PRRERLA.  Gross motor intact PSYCH: Normally interactive. Conversant. Not depressed or anxious appearing.  Calm demeanor.    Assessment and Plan: Transient monoplegia  CT Labs Neurology  Signed,  Ellison Carwin, MD

## 2014-10-03 ENCOUNTER — Ambulatory Visit: Payer: Commercial Managed Care - HMO | Admitting: Diagnostic Neuroimaging

## 2014-10-03 ENCOUNTER — Telehealth: Payer: Self-pay | Admitting: *Deleted

## 2014-10-03 NOTE — Telephone Encounter (Signed)
Wife Bonnita Nasuti answered phone and will be calling back to reschedule the appt that was missed this am due to weather.

## 2014-10-19 ENCOUNTER — Ambulatory Visit (INDEPENDENT_AMBULATORY_CARE_PROVIDER_SITE_OTHER): Payer: Commercial Managed Care - HMO | Admitting: Diagnostic Neuroimaging

## 2014-10-19 ENCOUNTER — Encounter: Payer: Self-pay | Admitting: Diagnostic Neuroimaging

## 2014-10-19 VITALS — BP 131/77 | HR 49 | Ht 70.0 in | Wt 194.0 lb

## 2014-10-19 DIAGNOSIS — G458 Other transient cerebral ischemic attacks and related syndromes: Secondary | ICD-10-CM

## 2014-10-19 MED ORDER — CLOPIDOGREL BISULFATE 75 MG PO TABS: 75.0000 mg | ORAL_TABLET | Freq: Every day | ORAL | Status: DC

## 2014-10-19 NOTE — Patient Instructions (Addendum)
1. Stop aspirin.  2. Start clopidogrel 75mg  daily  3. Follow up with PCP for diabetes, blood pressure and cholesterol testing / treatment.  4. I will setup MRI and ultrasound (neck and heart) testing.  Transient Ischemic Attack A transient ischemic attack (TIA) is a "warning stroke" that causes stroke-like symptoms. A TIA does not cause lasting damage to the brain. It is important to know when to get help and what to do to prevent stroke or death.  HOME CARE   Take all medicines exactly as told by your doctor. Understand all your medicine instructions.  You may need to take aspirin or warfarin medicine. Take warfarin exactly as told.  Taking too much or too little warfarin is dangerous. Blood tests must be done as often as told by your doctor. These blood tests help your doctor make sure the amount of warfarin you are taking is right. A PT blood test measures how long it takes for blood to clot. Your PT is used to calculate another value called an INR. Your PT and INR help your doctor adjust your warfarin dosage.  Food can cause problems with warfarin and affect the results of your blood tests. This is true for foods high in vitamin K. Spinach, kale, broccoli, cabbage, collard and turnip greens, Brussels sprouts, peas, cauliflower, seaweed, and parsley are high in vitamin K as well as beef and pork liver, green tea, and soybean oil. Eat the same amount of food high in vitamin K. Avoid major changes in your diet. Tell your doctor before changing your diet. Talk to a food specialist (dietitian) if you have questions.  Many medicines can cause problems with warfarin and affect your PT and INR. Tell your doctor about all medicines you take. This includes vitamins and dietary pills (supplements). Be careful with aspirin and medicines that relieve redness, soreness, and puffiness (inflammation). Do not take or stop medicines unless your doctor tells you to.  Warfarin can cause a lot of bruising or  bleeding. Hold pressure over cuts for longer than normal. Talk to your doctor about other side effects of warfarin.  Avoid sports or activities that may cause injury or bleeding.  Be careful when you shave, floss your teeth, or use sharp objects.  Avoid alcoholic drinks or drink very little alcohol while taking warfarin. Tell your doctor if you change how much alcohol you drink.  Tell your dentist and other doctors that you take warfarin before procedures.  Eat 5 or more servings of fruits and vegetables a day.  Follow your diet program as told, if you are given one.  Keep a healthy weight.  Stay active. Try to get at least 30 minutes of activity on most or all days.  Do not smoke.  Limit how much alcohol you drink even if you are not taking warfarin. Moderate alcohol use is:  No more than 2 drinks each day for men.  No more than 1 drink each day for women who are not pregnant.  Stop abusing drugs.  Keep your home safe so you do not fall. Try:  Putting grab bars in the bedroom and bathroom.  Raising toilet seats.  Putting a seat in the shower.  Keep all doctor visits a told. GET HELP IF:  Your personality changes.  You have trouble swallowing.  You are seeing two of everything.  You are dizzy.  You have a fever.  Your skin starts to break down. GET HELP RIGHT AWAY IF:  The symptoms below may  be a sign of an emergency. Do not wait to see if the symptoms go away. Call for help (911 in [place] Do not drive yourself to the hospital.  You have sudden weakness or numbness on the face, arm, or leg (especially on one side of the body).  You have sudden trouble walking or moving your arms or legs.  You have sudden confusion.  You have trouble talking or understanding.  You have sudden trouble seeing in one or both eyes.  You lose your balance or your movements are not smooth.  You have a sudden, severe headache with no known cause.  You have new chest pain or  you feel your heart beating in a unsteady way.  You are partly or totally unaware of what is going on around you. MAKE SURE YOU:   Understand these instructions.  Will watch your condition.  Will get help right away if you are not doing well or get worse. Document Released: 06/04/2008 Document Revised: 01/10/2014 Document Reviewed: 12/01/2013 [facility] Patient Information 2015 [place], [place]. This information is not intended to replace advice given to you by your health care provider. Make sure you discuss any questions you have with your health care provider.

## 2014-10-19 NOTE — Progress Notes (Signed)
[facility]  PATIENT: Terry Russo DOB: [birth year]  REFERRING CLINICIAN: Ouida Sills HISTORY FROM: patient and wife  REASON FOR VISIT: new consult    HISTORICAL  CHIEF COMPLAINT:  Chief Complaint  Patient presents with  . New Evaluation    monoplegia    HISTORY OF PRESENT ILLNESS:   69 year old right-handed male here for evaluation of left leg numbness and weakness. 3-4 weeks ago patient had a single episode of left leg tingling, weakness, lasting 15-20 minutes. Symptoms resolved. No problems with right leg, right arm, left arm, face, speech, vision. No recurrence of symptoms. Patient was on aspirin 81 mg per day at the time of this event. Patient's wife thinks he was having a "charley horse" or other type of muscle cramp problem.  Patient has remote injury to the right eye when a steel fragment impaled his right eye. 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  Patient has hypertension. Previously smoked cigarettes but quit in 2005. He drinks 0-2 alcoholic drinks per week.    REVIEW OF SYSTEMS: Full 14 system review of systems performed and notable only for as per history of present illness otherwise negative.  ALLERGIES: No Known Allergies  HOME MEDICATIONS: Outpatient Prescriptions Prior to Visit  Medication Sig Dispense Refill  . lisinopril-hydrochlorothiazide (PRINZIDE,ZESTORETIC) 20-25 MG per tablet Take 1 tablet by mouth daily. 90 tablet 1  . Multiple Vitamin (DAILY-VITAMIN) TABS Take 1 tablet by mouth daily.     Marland Kitchen aspirin 81 MG tablet Take 81 mg by mouth daily.     No facility-administered medications prior to visit.    PAST MEDICAL HISTORY: Past Medical History  Diagnosis Date  . Hypertension   . Right eye injury     pt has glass eye  . Chicken pox     PAST SURGICAL HISTORY: Past Surgical History  Procedure Laterality Date  . Amputation finger / thumb       left  . Abdominal surgery    . Hernia repair    . Enucleation      glass eye  . Colonoscopy  10/31/2011    Procedure: COLONOSCOPY;  Surgeon: Owens Loffler, MD;  Location: [facility];  Service: Endoscopy;  Laterality: N/A;    FAMILY HISTORY: Family History  Problem Relation Age of Onset  . Hypertension Sister   . Hypertension Sister   . Diabetes Brother   . Cancer Neg Hx   . Heart disease Neg Hx   . Stroke Neg Hx     SOCIAL HISTORY:  History   Social History  . Marital Status: Married    Spouse Name: Bonnita Nasuti  . Number of Children: 7  . Years of Education: N/A   Occupational History  . welder---retired    Social History Main Topics  . Smoking status: Former Smoker -- 1.50 packs/day for 20 years    Types: Cigarettes    Quit date: 10/11/2003  . Smokeless tobacco: Never Used  . Alcohol Use: 0.6 oz/week    1 Cans of beer per week     Comment: half to a whole beer - occasionally (social)  . Drug Use: No  . Sexual Activity: Not on file   Other Topics Concern  . Not on file   Social History Narrative   Lives with wife at home        [place]:   10/19/14 0921  BP: 131/77  Pulse: 49  Height: '5\' 10"'  (1.778 m)  Weight: 194 lb (87.998 kg)    Body mass index is 27.84 kg/(m^2).   Visual Acuity Screening   Right eye Left eye Both eyes  Without correction: unable 20/50   With correction:       No flowsheet data found.  GENERAL EXAM: Patient is in no distress; well developed, nourished and groomed; neck is supple  CARDIOVASCULAR: Regular rate and rhythm, no murmurs, no carotid bruits  NEUROLOGIC: MENTAL STATUS: awake, alert, oriented to person, place and time, recent and remote memory intact, normal attention and concentration, language fluent, comprehension intact, naming intact, fund of knowledge appropriate CRANIAL NERVE: RIGHT EYE PROSTHESIS; LEFT EYE --> no papilledema on fundoscopic exam, pupil equal and reactive to light,  visual fields full to confrontation, extraocular muscles intact, no nystagmus, facial sensation and strength symmetric, hearing intact, palate elevates symmetrically, uvula midline, shoulder shrug symmetric, tongue midline. MOTOR: normal bulk and tone, full strength in the BUE, BLE; EXCEPT RIGHT HF 4+. LEFT HAND DIGITS 1-3, PARTIAL DISTAL AMPUTATIONS AND POST-TRAUMATIC FINDINGS SENSORY: normal and symmetric to light touch, pinprick, temperature, vibration COORDINATION: finger-nose-finger, fine finger movements normal REFLEXES: deep tendon reflexes present and symmetric GAIT/STATION: narrow based gait; able to walk tandem; romberg is negative    DIAGNOSTIC DATA (LABS, IMAGING, TESTING) - I reviewed patient records, labs, notes, testing and imaging myself where available.  Lab Results  Component Value Date   WBC 4.5* 09/17/2014   HGB 14.0* 09/17/2014   HCT 42.1* 09/17/2014   MCV 95.1 09/17/2014   PLT 165.0 11/01/2013      Component Value Date/Time   NA 140 09/17/2014 1102   K 3.9 09/17/2014 1102   CL 102 09/17/2014 1102   CO2 28 09/17/2014 1102   GLUCOSE 98 09/17/2014 1102   BUN 17 09/17/2014 1102   CREATININE 1.07 09/17/2014 1102   CREATININE 1.0 11/01/2013 1159   CALCIUM 9.7 09/17/2014 1102   PROT 7.4 09/17/2014 1102   ALBUMIN 4.3 09/17/2014 1102   AST 16 09/17/2014 1102   ALT 18 09/17/2014 1102   ALKPHOS 66 09/17/2014 1102   BILITOT 0.7 09/17/2014 1102   GFRNONAA >60 09/20/2009 0810   GFRAA  09/20/2009 0810    >60        The eGFR has been calculated using the MDRD equation. This calculation has not been validated in all clinical situations. eGFR's persistently <60 mL/min signify possible Chronic Kidney Disease.   Lab Results  Component Value Date   CHOL 175 11/01/2013   HDL 64.80 11/01/2013   LDLCALC 93 11/01/2013   TRIG 85.0 11/01/2013   CHOLHDL 3 11/01/2013   No results found for: HGBA1C No results found for: VITAMINB12 Lab Results  Component Value Date     TSH 0.76 11/01/2013   I reviewed images myself and agree with interpretation. -VRP  09/17/14 CT head - Focal hypoattenuation within the inferior left frontal lobe may represent age indeterminate infarct, potentially subacute or chronic. No evidence for acute intracranial hemorrhage.    ASSESSMENT AND PLAN  69 y.o. year old male here with transient left leg numbness and weakness, concerning for possible transient ischemic attack. Will pursue further workup. Advised patient on general health measures, vascular risk factor reduction/management.  Dx: TIA (left leg weakness/numbness)  PLAN: - MRI MRA head - Carotid ultrasound - Transthoracic echocardiogram - Stop aspirin; start clopidogrel 75 mg daily - Follow up with PCP for diabetes, cholesterol, blood pressure screening and management  Orders Placed  This Encounter  Procedures  . MR Brain Wo Contrast  . MR MRA HEAD WO CONTRAST  . US Carotid Bilateral  . 2D Echocardiogram without contrast   Meds ordered this encounter  Medications  . clopidogrel (PLAVIX) 75 MG tablet    Sig: Take 1 tablet (75 mg total) by mouth daily.    Dispense:  30 tablet    Refill:  11   Return in about 3 months (around 01/17/2015).  I reviewed images, labs, notes, records myself. I summarized findings and reviewed with patient, for this high risk condition (TIA) requiring high complexity decision making.   Penni Bombard, MD 05/11/2223, 11:46 AM Certified in Neurology, Neurophysiology and Neuroimaging  [facility] [address] [place], [place] [address] [phone]

## 2014-10-20 ENCOUNTER — Ambulatory Visit: Payer: Medicare Other | Admitting: Internal Medicine

## 2014-10-21 ENCOUNTER — Telehealth: Payer: Self-pay | Admitting: Radiology

## 2014-10-21 ENCOUNTER — Ambulatory Visit: Payer: Medicare Other | Admitting: Internal Medicine

## 2014-10-21 NOTE — Telephone Encounter (Signed)
10/21/14   Spoke with wife and she asked me to call patient on Tuesday for scheduling the Carotid doppler.

## 2014-10-28 ENCOUNTER — Other Ambulatory Visit (HOSPITAL_COMMUNITY): Payer: Medicare HMO

## 2014-11-02 ENCOUNTER — Ambulatory Visit (INDEPENDENT_AMBULATORY_CARE_PROVIDER_SITE_OTHER): Payer: Commercial Managed Care - HMO

## 2014-11-02 DIAGNOSIS — G458 Other transient cerebral ischemic attacks and related syndromes: Secondary | ICD-10-CM

## 2014-11-08 ENCOUNTER — Other Ambulatory Visit (HOSPITAL_COMMUNITY): Payer: Medicare HMO

## 2014-11-09 ENCOUNTER — Encounter (HOSPITAL_COMMUNITY): Payer: Self-pay | Admitting: Diagnostic Neuroimaging

## 2014-11-17 ENCOUNTER — Telehealth: Payer: Self-pay | Admitting: Diagnostic Neuroimaging

## 2014-11-17 NOTE — Telephone Encounter (Signed)
This patient has been schedule twice to have is echo and no showed for both appointments.

## 2014-12-24 ENCOUNTER — Ambulatory Visit (INDEPENDENT_AMBULATORY_CARE_PROVIDER_SITE_OTHER): Payer: Commercial Managed Care - HMO | Admitting: Emergency Medicine

## 2014-12-24 VITALS — BP 162/94 | HR 54 | Resp 16 | Ht 70.0 in | Wt 196.4 lb

## 2014-12-24 DIAGNOSIS — S46812A Strain of other muscles, fascia and tendons at shoulder and upper arm level, left arm, initial encounter: Secondary | ICD-10-CM

## 2014-12-24 MED ORDER — MELOXICAM 15 MG PO TABS: 15.0000 mg | ORAL_TABLET | Freq: Every day | ORAL | Status: DC

## 2014-12-24 MED ORDER — CYCLOBENZAPRINE HCL 5 MG PO TABS: 5.0000 mg | ORAL_TABLET | Freq: Three times a day (TID) | ORAL | Status: DC | PRN

## 2014-12-24 NOTE — Patient Instructions (Signed)
Shoulder Sprain °A shoulder sprain is the result of damage to the tough, fiber-like tissues (ligaments) that help hold your shoulder in place. The ligaments may be stretched or torn. Besides the main shoulder joint (the ball and socket), there are several smaller joints that connect the bones in this area. A sprain usually involves one of those joints. Most often it is the acromioclavicular (or AC) joint. That is the joint that connects the collarbone (clavicle) and the shoulder blade (scapula) at the top point of the shoulder blade (acromion). °A shoulder sprain is a mild form of what is called a shoulder separation. Recovering from a shoulder sprain may take some time. For some, pain lingers for several months. Most people recover without long term problems. °CAUSES  °· A shoulder sprain is usually caused by some kind of trauma. This might be: °¨ Falling on an outstretched arm. °¨ Being hit hard on the shoulder. °¨ Twisting the arm. °· Shoulder sprains are more likely to occur in people who: °¨ Play sports. °¨ Have balance or coordination problems. °SYMPTOMS  °· Pain when you move your shoulder. °· Limited ability to move the shoulder. °· Swelling and tenderness on top of the shoulder. °· Redness or warmth in the shoulder. °· Bruising. °· A change in the shape of the shoulder. °DIAGNOSIS  °Your healthcare provider may: °· Ask about your symptoms. °· Ask about recent activity that might have caused those symptoms. °· Examine your shoulder. You may be asked to do simple exercises to test movement. The other shoulder will be examined for comparison. °· Order some tests that provide a look inside the body. They can show the extent of the injury. The tests could include: °¨ X-rays. °¨ CT (computed tomography) scan. °¨ MRI (magnetic resonance imaging) scan. °RISKS AND COMPLICATIONS °· Loss of full shoulder motion. °· Ongoing shoulder pain. °TREATMENT  °How long it takes to recover from a shoulder sprain depends on how  severe it was. Treatment options may include: °· Rest. You should not use the arm or shoulder until it heals. °· Ice. For 2 or 3 days after the injury, put an ice pack on the shoulder up to 4 times a day. It should stay on for 15 to 20 minutes each time. Wrap the ice in a towel so it does not touch your skin. °· Over-the-counter medicine to relieve pain. °· A sling or brace. This will keep the arm still while the shoulder is healing. °· Physical therapy or rehabilitation exercises. These will help you regain strength and motion. Ask your healthcare provider when it is OK to begin these exercises. °· Surgery. The need for surgery is rare with a sprained shoulder, but some people may need surgery to keep the joint in place and reduce pain. °HOME CARE INSTRUCTIONS  °· Ask your healthcare provider about what you should and should not do while your shoulder heals. °· Make sure you know how to apply ice to the correct area of your shoulder. °· Talk with your healthcare provider about which medications should be used for pain and swelling. °· If rehabilitation therapy will be needed, ask your healthcare provider to refer you to a therapist. If it is not recommended, then ask about at-home exercises. Find out when exercise should begin. °SEEK MEDICAL CARE IF:  °Your pain, swelling, or redness at the joint increases. °SEEK IMMEDIATE MEDICAL CARE IF:  °· You have a fever. °· You cannot move your arm or shoulder. °Document Released: 01/12/2009 Document   Revised: 11/18/2011 Document Reviewed: 01/12/2009 °[employer]® Patient Information ©2015 [employer]. This information is not intended to replace advice given to you by your health care provider. Make sure you discuss any questions you have with your health care provider. ° °

## 2014-12-24 NOTE — Progress Notes (Signed)
[facility] [address],  [place] [address] [phone]  Date:  12/24/2014   Name:  Terry Russo   DOB:  September 26, 1945   MRN:  [id number]  PCP:  Webb Silversmith, NP    Chief Complaint: Shoulder Pain   History of Present Illness:  Terry Russo is a 69 y.o. very pleasant male patient who presents with the following:  3 week history of left shoulder pain. No history of injury or overuse Intermittent Can't attribute any activity to making it worse or better No radiation of pain or neuro symptoms Says starts in left neck into shoulder. No improvement with over the counter medications or other home remedies. Denies other complaint or health concern today.   Patient Active Problem List   Diagnosis Date Noted  . Diverticulosis of colon (without mention of hemorrhage) 10/31/2011  . HTN (hypertension) 10/18/2011  . Depression 10/18/2011    Past Medical History  Diagnosis Date  . Hypertension   . Right eye injury     pt has glass eye  . Chicken pox     Past Surgical History  Procedure Laterality Date  . Amputation finger / thumb      left  . Abdominal surgery    . Hernia repair    . Enucleation      glass eye  . Colonoscopy  10/31/2011    Procedure: COLONOSCOPY;  Surgeon: Owens Loffler, MD;  Location: [facility];  Service: Endoscopy;  Laterality: N/A;    History  Substance Use Topics  . Smoking status: Former Smoker -- 1.50 packs/day for 20 years    Types: Cigarettes    Quit date: 10/11/2003  . Smokeless tobacco: Never Used  . Alcohol Use: 0.6 oz/week    1 Cans of beer per week     Comment: half to a whole beer - occasionally (social)    Family History  Problem Relation Age of Onset  . Hypertension Sister   . Hypertension Sister   . Diabetes Brother   . Cancer Neg Hx   . Heart disease Neg Hx   . Stroke Neg Hx     No Known Allergies  Medication list has been reviewed and updated.  Current Outpatient Prescriptions on File Prior  to Visit  Medication Sig Dispense Refill  . lisinopril-hydrochlorothiazide (PRINZIDE,ZESTORETIC) 20-25 MG per tablet Take 1 tablet by mouth daily. 90 tablet 1  . Multiple Vitamin (DAILY-VITAMIN) TABS Take 1 tablet by mouth daily.     . clopidogrel (PLAVIX) 75 MG tablet Take 1 tablet (75 mg total) by mouth daily. (Patient not taking: Reported on 12/24/2014) 30 tablet 11   No current facility-administered medications on file prior to visit.    Review of Systems:  As per HPI, otherwise negative.    Physical Examination: Filed Vitals:   12/24/14 0828  BP: 162/94  Pulse: 54  Resp: 16   Filed Vitals:   12/24/14 0828  Height: 5\' 10"  (1.778 m)  Weight: 196 lb 6.4 oz (89.086 kg)   Body mass index is 28.18 kg/(m^2). Ideal Body Weight: Weight in (lb) to have BMI = 25: 173.9   GEN: WDWN, NAD, Non-toxic, Alert & Oriented x 3 HEENT: Atraumatic, Normocephalic.  Ears and Nose: No external deformity. EXTR: No clubbing/cyanosis/edema NEURO: Normal gait.  PSYCH: Normally interactive. Conversant. Not depressed or anxious appearing.  Calm demeanor.  Left neck:  Tender trapezius  Full ROM shoulder, not tender, neuro intact  Assessment and Plan: Trapezius strain mobic  Flexeril Local heat  Signed,  Ellison Carwin, MD

## 2015-01-18 ENCOUNTER — Ambulatory Visit: Payer: Commercial Managed Care - HMO | Admitting: Diagnostic Neuroimaging

## 2015-06-30 ENCOUNTER — Ambulatory Visit (INDEPENDENT_AMBULATORY_CARE_PROVIDER_SITE_OTHER): Payer: Commercial Managed Care - HMO | Admitting: Family Medicine

## 2015-06-30 VITALS — BP 162/96 | HR 57 | Temp 98.5°F | Resp 16 | Ht 70.0 in | Wt 195.6 lb

## 2015-06-30 DIAGNOSIS — I10 Essential (primary) hypertension: Secondary | ICD-10-CM

## 2015-06-30 MED ORDER — LISINOPRIL-HYDROCHLOROTHIAZIDE 20-25 MG PO TABS: 1.0000 | ORAL_TABLET | Freq: Every day | ORAL | Status: DC

## 2015-06-30 NOTE — Progress Notes (Signed)
Subjective:  This chart was scribed for Delman Cheadle, MD by Thea Alken, ED Scribe. This patient was seen in room 1 and the patient's care was started at 6:24 PM.   Patient ID: Terry Russo, male    DOB: September 09, 1946, 69 y.o.   MRN: [id number]  HPI Chief Complaint  Patient presents with  . OTHER    BP medication    HPI Comments: TYON CERASOLI is a 69 y.o. male who presents to the [facility] for a medication refill. Pt has been out of his medication for 1 month. He occasionally checks BP outside of office and states it usually runs 130-140. He has been on lisinopril- HCTZ and is tolerating medication.   Pt was seen by neurology, Dr. Leta Baptist for left leg numbness and weakness on 10/19/14. He reccommended carotid ultrasound, which he had done, and transthoracic echocardiogram, never had this done no showed at both appointments. Dr. Leta Baptist also started him on Plavix. Pt is no longer taking this medication. He denies recent extremity numbness.  PCP-BAITY, REGINA, NP He has not had a physical in quite sometime. He has difficulty making an appointment with PCP.   Past Medical History  Diagnosis Date  . Hypertension   . Right eye injury     pt has glass eye  . Chicken pox    No Known Allergies Prior to Admission medications   Medication Sig Start Date End Date Taking? Authorizing Provider  aspirin 81 MG tablet Take 81 mg by mouth daily.   Yes Historical Provider, MD  clopidogrel (PLAVIX) 75 MG tablet Take 1 tablet (75 mg total) by mouth daily. 10/19/14  Yes Penni Bombard, MD  cyclobenzaprine (FLEXERIL) 5 MG tablet Take 1 tablet (5 mg total) by mouth 3 (three) times daily as needed for muscle spasms. 12/24/14  Yes Roselee Culver, MD  lisinopril-hydrochlorothiazide (PRINZIDE,ZESTORETIC) 20-25 MG per tablet Take 1 tablet by mouth daily. 04/19/14  Yes Jearld Fenton, NP  meloxicam (MOBIC) 15 MG tablet Take 1 tablet (15 mg total) by mouth daily. 12/24/14  Yes Roselee Culver, MD  Multiple Vitamin (DAILY-VITAMIN) TABS Take 1 tablet by mouth daily.    Yes Historical Provider, MD   Review of Systems  Eyes: Negative for photophobia and visual disturbance.  Respiratory: Negative for cough and shortness of breath.   Cardiovascular: Negative for chest pain.  Gastrointestinal: Negative for nausea, vomiting and diarrhea.  Genitourinary: Negative for enuresis and difficulty urinating.  Neurological: Negative for dizziness, weakness, light-headedness and numbness.  Hematological: Does not bruise/bleed easily.   Objective:   Physical Exam  Neck: No thyromegaly present.  Cardiovascular: Normal rate, regular rhythm and normal heart sounds.  Exam reveals no gallop and no friction rub.   No murmur heard. Pulmonary/Chest: Effort normal and breath sounds normal. No respiratory distress.    Filed Vitals:   06/30/15 1802  BP: 162/96  Pulse: 57  Temp: 98.5 F (36.9 C)  TempSrc: Oral  Resp: 16  Height: 5\' 10"  (1.778 m)  Weight: 195 lb 9.6 oz (88.724 kg)  SpO2: 98%   Assessment & Plan:   HTN: refilled  Meds ordered this encounter  Medications  . lisinopril-hydrochlorothiazide (PRINZIDE,ZESTORETIC) 20-25 MG tablet    Sig: Take 1 tablet by mouth daily.    Dispense:  90 tablet    Refill:  1    I personally performed the services described in this documentation, which was scribed in my presence. The recorded information  has been reviewed and considered, and addended by me as needed.  Delman Cheadle, MD MPH

## 2015-06-30 NOTE — Patient Instructions (Signed)
DASH Eating Plan DASH stands for "Dietary Approaches to Stop Hypertension." The DASH eating plan is a healthy eating plan that has been shown to reduce high blood pressure (hypertension). Additional health benefits may include reducing the risk of type 2 diabetes mellitus, heart disease, and stroke. The DASH eating plan may also help with weight loss. WHAT DO I NEED TO KNOW ABOUT THE DASH EATING PLAN? For the DASH eating plan, you will follow these general guidelines:  Choose foods with a percent daily value for sodium of less than 5% (as listed on the food label).  Use salt-free seasonings or herbs instead of table salt or sea salt.  Check with your health care provider or pharmacist before using salt substitutes.  Eat lower-sodium products, often labeled as "lower sodium" or "no salt added."  Eat fresh foods.  Eat more vegetables, fruits, and low-fat dairy products.  Choose whole grains. Look for the word "whole" as the first word in the ingredient list.  Choose fish and skinless chicken or turkey more often than red meat. Limit fish, poultry, and meat to 6 oz (170 g) each day.  Limit sweets, desserts, sugars, and sugary drinks.  Choose heart-healthy fats.  Limit cheese to 1 oz (28 g) per day.  Eat more home-cooked food and less restaurant, buffet, and fast food.  Limit fried foods.  Cook foods using methods other than frying.  Limit canned vegetables. If you do use them, rinse them well to decrease the sodium.  When eating at a restaurant, ask that your food be prepared with less salt, or no salt if possible. WHAT FOODS CAN I EAT? Seek help from a dietitian for individual calorie needs. Grains Whole grain or whole wheat bread. Brown rice. Whole grain or whole wheat pasta. Quinoa, bulgur, and whole grain cereals. Low-sodium cereals. Corn or whole wheat flour tortillas. Whole grain cornbread. Whole grain crackers. Low-sodium crackers. Vegetables Fresh or frozen vegetables  (raw, steamed, roasted, or grilled). Low-sodium or reduced-sodium tomato and vegetable juices. Low-sodium or reduced-sodium tomato sauce and paste. Low-sodium or reduced-sodium canned vegetables.  Fruits All fresh, canned (in natural juice), or frozen fruits. Meat and Other Protein Products Ground beef (85% or leaner), grass-fed beef, or beef trimmed of fat. Skinless chicken or turkey. Ground chicken or turkey. Pork trimmed of fat. All fish and seafood. Eggs. Dried beans, peas, or lentils. Unsalted nuts and seeds. Unsalted canned beans. Dairy Low-fat dairy products, such as skim or 1% milk, 2% or reduced-fat cheeses, low-fat ricotta or cottage cheese, or plain low-fat yogurt. Low-sodium or reduced-sodium cheeses. Fats and Oils Tub margarines without trans fats. Light or reduced-fat mayonnaise and salad dressings (reduced sodium). Avocado. Safflower, olive, or canola oils. Natural peanut or almond butter. Other Unsalted popcorn and pretzels. The items listed above may not be a complete list of recommended foods or beverages. Contact your dietitian for more options. WHAT FOODS ARE NOT RECOMMENDED? Grains White bread. White pasta. White rice. Refined cornbread. Bagels and croissants. Crackers that contain trans fat. Vegetables Creamed or fried vegetables. Vegetables in a cheese sauce. Regular canned vegetables. Regular canned tomato sauce and paste. Regular tomato and vegetable juices. Fruits Dried fruits. Canned fruit in light or heavy syrup. Fruit juice. Meat and Other Protein Products Fatty cuts of meat. Ribs, chicken wings, bacon, sausage, bologna, salami, chitterlings, fatback, hot dogs, bratwurst, and packaged luncheon meats. Salted nuts and seeds. Canned beans with salt. Dairy Whole or 2% milk, cream, half-and-half, and cream cheese. Whole-fat or sweetened yogurt. Full-fat   cheeses or blue cheese. Nondairy creamers and whipped toppings. Processed cheese, cheese spreads, or cheese  curds. Condiments Onion and garlic salt, seasoned salt, table salt, and sea salt. Canned and packaged gravies. Worcestershire sauce. Tartar sauce. Barbecue sauce. Teriyaki sauce. Soy sauce, including reduced sodium. Steak sauce. Fish sauce. Oyster sauce. Cocktail sauce. Horseradish. Ketchup and mustard. Meat flavorings and tenderizers. Bouillon cubes. Hot sauce. Tabasco sauce. Marinades. Taco seasonings. Relishes. Fats and Oils Butter, stick margarine, lard, shortening, ghee, and bacon fat. Coconut, palm kernel, or palm oils. Regular salad dressings. Other Pickles and olives. Salted popcorn and pretzels. The items listed above may not be a complete list of foods and beverages to avoid. Contact your dietitian for more information. WHERE CAN I FIND MORE INFORMATION? National Heart, Lung, and Blood Institute: travelstabloid.com   This information is not intended to replace advice given to you by your health care provider. Make sure you discuss any questions you have with your health care provider.   Document Released: 08/15/2011 Document Revised: 09/16/2014 Document Reviewed: 06/30/2013 Elsevier Interactive Patient Education 2016 [place] Your High Blood Pressure Blood pressure is a measurement of how forceful your blood is pressing against the walls of the arteries. Arteries are muscular tubes within the circulatory system. Blood pressure does not stay the same. Blood pressure rises when you are active, excited, or nervous; and it lowers during sleep and relaxation. If the numbers measuring your blood pressure stay above normal most of the time, you are at risk for health problems. High blood pressure (hypertension) is a long-term (chronic) condition in which blood pressure is elevated. A blood pressure reading is recorded as two numbers, such as 120 over 80 (or 120/80). The first, higher number is called the systolic pressure. It is a measure of the  pressure in your arteries as the heart beats. The second, lower number is called the diastolic pressure. It is a measure of the pressure in your arteries as the heart relaxes between beats.  Keeping your blood pressure in a normal range is important to your overall health and prevention of health problems, such as heart disease and stroke. When your blood pressure is uncontrolled, your heart has to work harder than normal. High blood pressure is a very common condition in adults because blood pressure tends to rise with age. Men and women are equally likely to have hypertension but at different times in life. Before age 35, men are more likely to have hypertension. After 67 years of age, women are more likely to have it. Hypertension is especially common in African Americans. This condition often has no signs or symptoms. The cause of the condition is usually not known. Your caregiver can help you come up with a plan to keep your blood pressure in a normal, healthy range. BLOOD PRESSURE STAGES Blood pressure is classified into four stages: normal, prehypertension, stage 1, and stage 2. Your blood pressure reading will be used to determine what type of treatment, if any, is necessary. Appropriate treatment options are tied to these four stages:  Normal  Systolic pressure (mm Hg): below 120.  Diastolic pressure (mm Hg): below 80. Prehypertension  Systolic pressure (mm Hg): 120 to 139.  Diastolic pressure (mm Hg): 80 to 89. Stage1  Systolic pressure (mm Hg): 140 to 159.  Diastolic pressure (mm Hg): 90 to 99. Stage2  Systolic pressure (mm Hg): 160 or above.  Diastolic pressure (mm Hg): 100 or above. RISKS RELATED TO HIGH BLOOD PRESSURE Managing your blood  pressure is an important responsibility. Uncontrolled high blood pressure can lead to:  A heart attack.  A stroke.  A weakened blood vessel (aneurysm).  Heart failure.  Kidney damage.  Eye damage.  Metabolic syndrome.  Memory  and concentration problems. HOW TO MANAGE YOUR BLOOD PRESSURE Blood pressure can be managed effectively with lifestyle changes and medicines (if needed). Your caregiver will help you come up with a plan to bring your blood pressure within a normal range. Your plan should include the following: Education  Read all information provided by your caregivers about how to control blood pressure.  Educate yourself on the latest guidelines and treatment recommendations. New research is always being done to further define the risks and treatments for high blood pressure. Lifestylechanges  Control your weight.  Avoid smoking.  Stay physically active.  Reduce the amount of salt in your diet.  Reduce stress.  Control any chronic conditions, such as high cholesterol or diabetes.  Reduce your alcohol intake. Medicines  Several medicines (antihypertensive medicines) are available, if needed, to bring blood pressure within a normal range. Communication  Review all the medicines you take with your caregiver because there may be side effects or interactions.  Talk with your caregiver about your diet, exercise habits, and other lifestyle factors that may be contributing to high blood pressure.  See your caregiver regularly. Your caregiver can help you create and adjust your plan for managing high blood pressure. RECOMMENDATIONS FOR TREATMENT AND FOLLOW-UP  The following recommendations are based on current guidelines for managing high blood pressure in nonpregnant adults. Use these recommendations to identify the proper follow-up period or treatment option based on your blood pressure reading. You can discuss these options with your caregiver.  Systolic pressure of 010 to 071 or diastolic pressure of 80 to 89: Follow up with your caregiver as directed.  Systolic pressure of 219 to 758 or diastolic pressure of 90 to 100: Follow up with your caregiver within 2 months.  Systolic pressure above 832  or diastolic pressure above 549: Follow up with your caregiver within 1 month.  Systolic pressure above 826 or diastolic pressure above 415: Consider antihypertensive therapy; follow up with your caregiver within 1 week.  Systolic pressure above 830 or diastolic pressure above 940: Begin antihypertensive therapy; follow up with your caregiver within 1 week.   This information is not intended to replace advice given to you by your health care provider. Make sure you discuss any questions you have with your health care provider.   Document Released: 05/20/2012 Document Reviewed: 05/20/2012 Elsevier Interactive Patient Education [employer].

## 2015-07-03 NOTE — Progress Notes (Signed)
Left message for patient to call back to schedule CPE with Dr Brigitte Pulse.

## 2015-07-06 ENCOUNTER — Telehealth: Payer: Self-pay | Admitting: Family Medicine

## 2015-07-06 NOTE — Telephone Encounter (Signed)
-----   Message from Shawnee Knapp, MD sent at 06/30/2015  6:59 PM EDT ----- Please get pt scheduled for his annual wellness visit with fasting labs within the next 6 months, thanks.

## 2015-07-06 NOTE — Telephone Encounter (Signed)
vm not available on cell or home phone

## 2015-07-24 ENCOUNTER — Ambulatory Visit: Payer: Medicare HMO | Admitting: Internal Medicine

## 2015-07-24 DIAGNOSIS — Z0289 Encounter for other administrative examinations: Secondary | ICD-10-CM

## 2015-08-18 ENCOUNTER — Ambulatory Visit (INDEPENDENT_AMBULATORY_CARE_PROVIDER_SITE_OTHER)
Admission: RE | Admit: 2015-08-18 | Discharge: 2015-08-18 | Disposition: A | Discharge status: Home / Self Care | Payer: Commercial Managed Care - HMO | Source: Ambulatory Visit | Attending: Internal Medicine | Admitting: Internal Medicine

## 2015-08-18 ENCOUNTER — Encounter: Payer: Self-pay | Admitting: Internal Medicine

## 2015-08-18 ENCOUNTER — Ambulatory Visit (INDEPENDENT_AMBULATORY_CARE_PROVIDER_SITE_OTHER): Payer: Commercial Managed Care - HMO | Admitting: Internal Medicine

## 2015-08-18 VITALS — BP 160/86 | HR 61 | Temp 98.5°F | Wt 196.0 lb

## 2015-08-18 DIAGNOSIS — M545 Low back pain, unspecified: Secondary | ICD-10-CM

## 2015-08-18 NOTE — Progress Notes (Signed)
Pre visit review using our clinic review tool, if applicable. No additional management support is needed unless otherwise documented below in the visit note. 

## 2015-08-18 NOTE — Patient Instructions (Signed)

## 2015-08-18 NOTE — Progress Notes (Signed)
Subjective:    Patient ID: Terry Russo, male    DOB: 1946-05-29, 69 y.o.   MRN: [id number]  HPI  Pt presents to the clinic today with c/o back pain. This started 6 months ago. It has been intermittent during that time. The pain starts in his lower back and radiates around to both sides. He describes the pain as sharp and shooting. He denies numbness and tingling in his legs. He denies loss of bowel or bladder. He reports he does have an old injury in this area but nothing recent. He has been wearing a back brace and tried heat. He has not taken any OTC medications.  Review of Systems      Past Medical History  Diagnosis Date  . Hypertension   . Right eye injury     pt has glass eye  . Chicken pox     Current Outpatient Prescriptions  Medication Sig Dispense Refill  . aspirin 81 MG tablet Take 81 mg by mouth daily.    . clopidogrel (PLAVIX) 75 MG tablet Take 1 tablet (75 mg total) by mouth daily. 30 tablet 11  . cyclobenzaprine (FLEXERIL) 5 MG tablet Take 1 tablet (5 mg total) by mouth 3 (three) times daily as needed for muscle spasms. 30 tablet 0  . lisinopril-hydrochlorothiazide (PRINZIDE,ZESTORETIC) 20-25 MG tablet Take 1 tablet by mouth daily. 90 tablet 1  . meloxicam (MOBIC) 15 MG tablet Take 1 tablet (15 mg total) by mouth daily. 30 tablet 0  . Multiple Vitamin (DAILY-VITAMIN) TABS Take 1 tablet by mouth daily.      No current facility-administered medications for this visit.    No Known Allergies  Family History  Problem Relation Age of Onset  . Hypertension Sister   . Hypertension Sister   . Diabetes Brother   . Cancer Neg Hx   . Heart disease Neg Hx   . Stroke Neg Hx     Social History   Social History  . Marital Status: Married    Spouse Name: Bonnita Nasuti  . Number of Children: 7  . Years of Education: N/A   Occupational History  . welder---retired    Social History Main Topics  . Smoking status: Former Smoker -- 1.50 packs/day for 20 years    Types:  Cigarettes    Quit date: 10/11/2003  . Smokeless tobacco: Never Used  . Alcohol Use: 0.6 oz/week    1 Cans of beer per week     Comment: half to a whole beer - occasionally (social)  . Drug Use: No  . Sexual Activity: Not on file   Other Topics Concern  . Not on file   Social History Narrative   Lives with wife at home        Constitutional: Denies fever, malaise, fatigue, headache or abrupt weight changes.  Respiratory: Denies difficulty breathing, shortness of breath, cough or sputum production.   Cardiovascular: Denies chest pain, chest tightness, palpitations or swelling in the hands or feet.  Gastrointestinal: Denies abdominal pain, bloating, constipation, diarrhea or blood in the stool.  GU: Denies urgency, frequency, pain with urination, burning sensation, blood in urine, odor or discharge. Musculoskeletal: Pt reports back pain. Denies decrease in range of motion, difficulty with gait, or joint swelling.  Neurological: Denies dizziness, difficulty with memory, difficulty with speech or problems with balance and coordination.   No other specific complaints in a complete review of systems (except as listed in HPI above).  Objective:   Physical Exam  BP 160/86 mmHg  Pulse 61  Temp(Src) 98.5 F (36.9 C) (Oral)  Wt 196 lb (88.905 kg)  SpO2 98% Wt Readings from Last 3 Encounters:  08/18/15 196 lb (88.905 kg)  06/30/15 195 lb 9.6 oz (88.724 kg)  12/24/14 196 lb 6.4 oz (89.086 kg)    General: Appears his stated age, well developed, well nourished in NAD. Cardiovascular: Normal rate and rhythm. S1,S2 noted.  No murmur, rubs or gallops noted.  Pulmonary/Chest: Normal effort and positive vesicular breath sounds. No respiratory distress. No wheezes, rales or ronchi noted.  Abdomen: Soft and nontender. Normal bowel sounds. No CVA tenderness. Musculoskeletal: Normal flexion, extension and rotation of the spine. No bony tenderness noted. No pain with palpation of the  paralumbar muscles. No difficulty with gait.  Neurological: Alert and oriented.   BMET    Component Value Date/Time   NA 140 09/17/2014 1102   K 3.9 09/17/2014 1102   CL 102 09/17/2014 1102   CO2 28 09/17/2014 1102   GLUCOSE 98 09/17/2014 1102   BUN 17 09/17/2014 1102   CREATININE 1.07 09/17/2014 1102   CREATININE 1.0 11/01/2013 1159   CALCIUM 9.7 09/17/2014 1102   GFRNONAA >60 09/20/2009 0810   GFRAA  09/20/2009 0810    >60        The eGFR has been calculated using the MDRD equation. This calculation has not been validated in all clinical situations. eGFR's persistently <60 mL/min signify possible Chronic Kidney Disease.    Lipid Panel     Component Value Date/Time   CHOL 175 11/01/2013 1159   TRIG 85.0 11/01/2013 1159   HDL 64.80 11/01/2013 1159   CHOLHDL 3 11/01/2013 1159   VLDL 17.0 11/01/2013 1159   LDLCALC 93 11/01/2013 1159    CBC    Component Value Date/Time   WBC 4.5* 09/17/2014 1110   WBC 5.0 11/01/2013 1159   RBC 4.42* 09/17/2014 1110   RBC 4.39 11/01/2013 1159   HGB 14.0* 09/17/2014 1110   HGB 13.8 11/01/2013 1159   HCT 42.1* 09/17/2014 1110   HCT 41.8 11/01/2013 1159   PLT 165.0 11/01/2013 1159   MCV 95.1 09/17/2014 1110   MCV 95.4 11/01/2013 1159   MCH 31.7* 09/17/2014 1110   MCH 30.7 10/18/2011 0937   MCHC 33.3 09/17/2014 1110   MCHC 32.9 11/01/2013 1159   RDW 12.9 11/01/2013 1159   LYMPHSABS 2.6 11/01/2013 1159   MONOABS 0.5 11/01/2013 1159   EOSABS 0.1 11/01/2013 1159   BASOSABS 0.0 11/01/2013 1159    Hgb A1C No results found for: HGBA1C      Assessment & Plan:   Low back pain:  ? Muscle strain, he is still very active and works all the time Advised him to try Aleve once a day Back exercises given Given duration of symptoms, will obtain xray of lumbar spine Continue back brace while working for extra support  Will follow up after xrays are back, RTC in 1 month to follow up chronic conditions

## 2015-09-10 DIAGNOSIS — G459 Transient cerebral ischemic attack, unspecified: Secondary | ICD-10-CM

## 2015-09-10 HISTORY — DX: Transient cerebral ischemic attack, unspecified: G45.9

## 2015-12-27 ENCOUNTER — Encounter (HOSPITAL_COMMUNITY): Payer: Self-pay

## 2015-12-27 ENCOUNTER — Emergency Department (HOSPITAL_COMMUNITY)
Admission: EM | Admit: 2015-12-27 | Discharge: 2015-12-27 | Disposition: A | Discharge status: Home / Self Care | Payer: Commercial Managed Care - HMO | Attending: Emergency Medicine | Admitting: Emergency Medicine

## 2015-12-27 ENCOUNTER — Ambulatory Visit (INDEPENDENT_AMBULATORY_CARE_PROVIDER_SITE_OTHER): Payer: Commercial Managed Care - HMO | Admitting: Urgent Care

## 2015-12-27 ENCOUNTER — Emergency Department (HOSPITAL_COMMUNITY): Payer: Commercial Managed Care - HMO

## 2015-12-27 VITALS — BP 136/82 | HR 54 | Temp 97.7°F | Resp 18 | Ht 70.0 in | Wt 199.0 lb

## 2015-12-27 DIAGNOSIS — Z7982 Long term (current) use of aspirin: Secondary | ICD-10-CM | POA: Diagnosis not present

## 2015-12-27 DIAGNOSIS — Z79899 Other long term (current) drug therapy: Secondary | ICD-10-CM | POA: Insufficient documentation

## 2015-12-27 DIAGNOSIS — Z87891 Personal history of nicotine dependence: Secondary | ICD-10-CM | POA: Insufficient documentation

## 2015-12-27 DIAGNOSIS — Z87828 Personal history of other (healed) physical injury and trauma: Secondary | ICD-10-CM | POA: Diagnosis not present

## 2015-12-27 DIAGNOSIS — Z8619 Personal history of other infectious and parasitic diseases: Secondary | ICD-10-CM | POA: Diagnosis not present

## 2015-12-27 DIAGNOSIS — I10 Essential (primary) hypertension: Secondary | ICD-10-CM | POA: Diagnosis not present

## 2015-12-27 DIAGNOSIS — R079 Chest pain, unspecified: Secondary | ICD-10-CM | POA: Diagnosis not present

## 2015-12-27 DIAGNOSIS — R0789 Other chest pain: Secondary | ICD-10-CM

## 2015-12-27 LAB — BASIC METABOLIC PANEL
ANION GAP: 9 (ref 5–15)
BUN: 13 mg/dL (ref 6–20)
CO2: 26 mmol/L (ref 22–32)
Calcium: 9 mg/dL (ref 8.9–10.3)
Chloride: 104 mmol/L (ref 101–111)
Creatinine, Ser: 1.08 mg/dL (ref 0.61–1.24)
GLUCOSE: 103 mg/dL — AB (ref 65–99)
POTASSIUM: 3.9 mmol/L (ref 3.5–5.1)
SODIUM: 139 mmol/L (ref 135–145)

## 2015-12-27 LAB — CBC WITH DIFFERENTIAL/PLATELET
BASOS ABS: 0 10*3/uL (ref 0.0–0.1)
Basophils Relative: 1 %
Eosinophils Absolute: 0.1 10*3/uL (ref 0.0–0.7)
Eosinophils Relative: 3 %
HEMATOCRIT: 37.3 % — AB (ref 39.0–52.0)
HEMOGLOBIN: 12.9 g/dL — AB (ref 13.0–17.0)
Lymphocytes Relative: 46 %
Lymphs Abs: 2.3 10*3/uL (ref 0.7–4.0)
MCH: 32.2 pg (ref 26.0–34.0)
MCHC: 34.6 g/dL (ref 30.0–36.0)
MCV: 93 fL (ref 78.0–100.0)
Monocytes Absolute: 0.5 10*3/uL (ref 0.1–1.0)
Monocytes Relative: 11 %
NEUTROS ABS: 2 10*3/uL (ref 1.7–7.7)
NEUTROS PCT: 39 %
Platelets: 166 10*3/uL (ref 150–400)
RBC: 4.01 MIL/uL — AB (ref 4.22–5.81)
RDW: 12.2 % (ref 11.5–15.5)
WBC: 5 10*3/uL (ref 4.0–10.5)

## 2015-12-27 LAB — I-STAT TROPONIN, ED
TROPONIN I, POC: 0 ng/mL (ref 0.00–0.08)
Troponin i, poc: 0.01 ng/mL (ref 0.00–0.08)

## 2015-12-27 MED ORDER — ASPIRIN 81 MG PO CHEW
324.0000 mg | CHEWABLE_TABLET | Freq: Once | ORAL | Status: AC
  Administered 2015-12-27: 324 mg via ORAL

## 2015-12-27 NOTE — Patient Instructions (Signed)
     IF you received an x-ray today, you will receive an invoice from [facility]. Please contact [facility] at [phone] with questions or concerns regarding your invoice.   IF you received labwork today, you will receive an invoice from [facility]. Please contact [facility] at [phone] with questions or concerns regarding your invoice.   Our billing staff will not be able to assist you with questions regarding bills from these companies.  You will be contacted with the lab results as soon as they are available. The fastest way to get your results is to activate your My Chart account. Instructions are located on the last page of this paperwork. If you have not heard from us regarding the results in 2 weeks, please contact this office.      

## 2015-12-27 NOTE — Progress Notes (Signed)
    MRN: [id number] DOB: 1945-11-28  Subjective:   Terry Russo is a 70 y.o. male presenting for chief complaint of Chest Pain  Reports 2 day history of new onset intermittent left-sided chest pain that radiates anteriorly to his mid-sternum. The pain is sharp and lasts seconds but he has continued to have several episodes for the last 2 days. He has a history of HTN, managed with lis-HCTZ. He also takes a baby aspirin daily. Denies diaphoresis, neck pain, jaw pain, limb pain, n/v, abdominal pain.   Terry Russo has a current medication list which includes the following prescription(s): aspirin, lisinopril-hydrochlorothiazide, daily-vitamin, clopidogrel, cyclobenzaprine, and meloxicam. Also has No Known Allergies.  Terry Russo  has a past medical history of Hypertension; Right eye injury; and Chicken pox. Also  has past surgical history that includes Amputation finger / thumb; Abdominal surgery; Hernia repair; Enucleation; and Colonoscopy (10/31/2011).  Objective:   Vitals: BP 136/82 mmHg  Pulse 54  Temp(Src) 97.7 F (36.5 C) (Oral)  Resp 18  Ht 5\' 10"  (1.778 m)  Wt 199 lb (90.266 kg)  BMI 28.55 kg/m2  SpO2 98%  Physical Exam  Constitutional: He is oriented to person, place, and time. He appears well-developed and well-nourished.  HENT:  Mouth/Throat: Oropharynx is clear and moist.  Eyes: No scleral icterus.  Neck: Normal range of motion. Neck supple.  Cardiovascular: Normal rate, regular rhythm and intact distal pulses.  Exam reveals no gallop and no friction rub.   No murmur heard. Pulmonary/Chest: No respiratory distress. He has no wheezes. He has no rales.  Abdominal: Soft. Bowel sounds are normal. He exhibits no distension and no mass. There is no tenderness.  Vertical surgical scar along his mid-abdomen.  Musculoskeletal: He exhibits no edema.  Neurological: He is alert and oriented to person, place, and time.  Skin: Skin is warm and dry.   ECG interpretation - ST-elevations in  leads V1-V4, ST depression in lead III.   Assessment and Plan :   1. Other chest pain 2. Essential hypertension - Will send via EMS for r/o of ACS. Patient and his wife verbalized understanding.  Jaynee Eagles, PA-C [facility] [phone] 12/27/2015 5:06 PM

## 2015-12-27 NOTE — Discharge Instructions (Signed)
Nonspecific Chest Pain  °Chest pain can be caused by many different conditions. There is always a chance that your pain could be related to something serious, such as a heart attack or a blood clot in your lungs. Chest pain can also be caused by conditions that are not life-threatening. If you have chest pain, it is very important to follow up with your health care provider. °CAUSES  °Chest pain can be caused by: °· Heartburn. °· Pneumonia or bronchitis. °· Anxiety or stress. °· Inflammation around your heart (pericarditis) or lung (pleuritis or pleurisy). °· A blood clot in your lung. °· A collapsed lung (pneumothorax). It can develop suddenly on its own (spontaneous pneumothorax) or from trauma to the chest. °· Shingles infection (varicella-zoster virus). °· Heart attack. °· Damage to the bones, muscles, and cartilage that make up your chest wall. This can include: °¨ Bruised bones due to injury. °¨ Strained muscles or cartilage due to frequent or repeated coughing or overwork. °¨ Fracture to one or more ribs. °¨ Sore cartilage due to inflammation (costochondritis). °RISK FACTORS  °Risk factors for chest pain may include: °· Activities that increase your risk for trauma or injury to your chest. °· Respiratory infections or conditions that cause frequent coughing. °· Medical conditions or overeating that can cause heartburn. °· Heart disease or family history of heart disease. °· Conditions or health behaviors that increase your risk of developing a blood clot. °· Having had chicken pox (varicella zoster). °SIGNS AND SYMPTOMS °Chest pain can feel like: °· Burning or tingling on the surface of your chest or deep in your chest. °· Crushing, pressure, aching, or squeezing pain. °· Dull or sharp pain that is worse when you move, cough, or take a deep breath. °· Pain that is also felt in your back, neck, shoulder, or arm, or pain that spreads to any of these areas. °Your chest pain may come and go, or it may stay  constant. °DIAGNOSIS °Lab tests or other studies may be needed to find the cause of your pain. Your health care provider may have you take a test called an ambulatory ECG (electrocardiogram). An ECG records your heartbeat patterns at the time the test is performed. You may also have other tests, such as: °· Transthoracic echocardiogram (TTE). During echocardiography, sound waves are used to create a picture of all of the heart structures and to look at how blood flows through your heart. °· Transesophageal echocardiogram (TEE). This is a more advanced imaging test that obtains images from inside your body. It allows your health care provider to see your heart in finer detail. °· Cardiac monitoring. This allows your health care provider to monitor your heart rate and rhythm in real time. °· Holter monitor. This is a portable device that records your heartbeat and can help to diagnose abnormal heartbeats. It allows your health care provider to track your heart activity for several days, if needed. °· Stress tests. These can be done through exercise or by taking medicine that makes your heart beat more quickly. °· Blood tests. °· Imaging tests. °TREATMENT  °Your treatment depends on what is causing your chest pain. Treatment may include: °· Medicines. These may include: °¨ Acid blockers for heartburn. °¨ Anti-inflammatory medicine. °¨ Pain medicine for inflammatory conditions. °¨ Antibiotic medicine, if an infection is present. °¨ Medicines to dissolve blood clots. °¨ Medicines to treat coronary artery disease. °· Supportive care for conditions that do not require medicines. This may include: °¨ Resting. °¨ Applying heat   or cold packs to injured areas. °¨ Limiting activities until pain decreases. °HOME CARE INSTRUCTIONS °· If you were prescribed an antibiotic medicine, finish it all even if you start to feel better. °· Avoid any activities that bring on chest pain. °· Do not use any tobacco products, including  cigarettes, chewing tobacco, or electronic cigarettes. If you need help quitting, ask your health care provider. °· Do not drink alcohol. °· Take medicines only as directed by your health care provider. °· Keep all follow-up visits as directed by your health care provider. This is important. This includes any further testing if your chest pain does not go away. °· If heartburn is the cause for your chest pain, you may be told to keep your head raised (elevated) while sleeping. This reduces the chance that acid will go from your stomach into your esophagus. °· Make lifestyle changes as directed by your health care provider. These may include: °¨ Getting regular exercise. Ask your health care provider to suggest some activities that are safe for you. °¨ Eating a heart-healthy diet. A registered dietitian can help you to learn healthy eating options. °¨ Maintaining a healthy weight. °¨ Managing diabetes, if necessary. °¨ Reducing stress. °SEEK MEDICAL CARE IF: °· Your chest pain does not go away after treatment. °· You have a rash with blisters on your chest. °· You have a fever. °SEEK IMMEDIATE MEDICAL CARE IF:  °· Your chest pain is worse. °· You have an increasing cough, or you cough up blood. °· You have severe abdominal pain. °· You have severe weakness. °· You faint. °· You have chills. °· You have sudden, unexplained chest discomfort. °· You have sudden, unexplained discomfort in your arms, back, neck, or jaw. °· You have shortness of breath at any time. °· You suddenly start to sweat, or your skin gets clammy. °· You feel nauseous or you vomit. °· You suddenly feel light-headed or dizzy. °· Your heart begins to beat quickly, or it feels like it is skipping beats. °These symptoms may represent a serious problem that is an emergency. Do not wait to see if the symptoms will go away. Get medical help right away. Call your local emergency services (911 in the [place] Do not drive yourself to the hospital. °  °This  information is not intended to replace advice given to you by your health care provider. Make sure you discuss any questions you have with your health care provider. °  °Document Released: 06/05/2005 Document Revised: 09/16/2014 Document Reviewed: 04/01/2014 °Elsevier Interactive Patient Education ©2016 [employer] ° °

## 2015-12-27 NOTE — ED Notes (Signed)
[facility]- pt here from [place] with intermittent chest pain and STEMI called on the monitor. Upon arrival, cardiology noted that EKG was consistent with previous EKG's. Pt denies chest pain on arrival. 2 IV's in place. No distress noted.

## 2015-12-27 NOTE — ED Provider Notes (Signed)
CSN: [id number]     Arrival date & time 12/27/15  38 History   First MD Initiated Contact with Patient 12/27/15 1758     Chief Complaint  Patient presents with  . Chest Pain    EMAAN TRAVASSOS is a 70 y.o. male with a history of hypertension who presents to the ED via EMS complaining of intermittent left-sided chest pain ongoing for the past 3 days. Patient reports he's had intermittent left-sided chest pain that lasts several seconds then resolves. He is unable to identify alleviating or aggravating factors. He denies pain with exertion. He denies shortness of breath. She reports history of hypertension and is compliant with his medications. The patient was seen at urgent care today and was transported by EMS to the emergency department. EMS obtained a 12-lead showing ST elevation in his anterior leads and activated a code STEMI. On arrival to the emergency department repeat EKGs revealed that these are unchanged from his previous tracing. The patient has been chest pain-free since around 5 PM today. He reports no chest pain since arrival to the emergency department. He did receive aspirin by EMS. The patient denies fevers, coughing, shortness of breath, lightheadedness, dizziness, leg pain, leg swelling, recent long travel, history of PEs or DVTs, history of smoking, abdominal pain, nausea or vomiting. He denies personal or close family history of MI.  Patient is a 70 y.o. male presenting with chest pain. The history is provided by the patient and the EMS personnel. No language interpreter was used.  Chest Pain Associated symptoms: no abdominal pain, no back pain, no cough, no fever, no headache, no nausea, no palpitations, no shortness of breath, not vomiting and no weakness     Past Medical History  Diagnosis Date  . Hypertension   . Right eye injury     pt has glass eye  . Chicken pox    Past Surgical History  Procedure Laterality Date  . Amputation finger / thumb      left  .  Abdominal surgery    . Hernia repair    . Enucleation      glass eye  . Colonoscopy  10/31/2011    Procedure: COLONOSCOPY;  Surgeon: Owens Loffler, MD;  Location: [facility];  Service: Endoscopy;  Laterality: N/A;   Family History  Problem Relation Age of Onset  . Hypertension Sister   . Hypertension Sister   . Diabetes Brother   . Cancer Neg Hx   . Heart disease Neg Hx   . Stroke Neg Hx    Social History  Substance Use Topics  . Smoking status: Former Smoker -- 1.50 packs/day for 20 years    Types: Cigarettes    Quit date: 10/11/2003  . Smokeless tobacco: Never Used  . Alcohol Use: 0.6 oz/week    1 Cans of beer per week     Comment: half to a whole beer - occasionally (social)    Review of Systems  Constitutional: Negative for fever and chills.  HENT: Negative for congestion and sore throat.   Eyes: Negative for visual disturbance.  Respiratory: Negative for cough and shortness of breath.   Cardiovascular: Positive for chest pain. Negative for palpitations and leg swelling.  Gastrointestinal: Negative for nausea, vomiting and abdominal pain.  Genitourinary: Negative for dysuria.  Musculoskeletal: Negative for back pain and neck pain.  Skin: Negative for rash.  Neurological: Negative for syncope, weakness, light-headedness and headaches.      Allergies  Review of patient's allergies indicates  no known allergies.  Home Medications   Prior to Admission medications   Medication Sig Start Date End Date Taking? Authorizing Provider  aspirin 81 MG tablet Take 81 mg by mouth daily.   Yes Historical Provider, MD  lisinopril-hydrochlorothiazide (PRINZIDE,ZESTORETIC) 20-25 MG tablet Take 1 tablet by mouth daily. 06/30/15  Yes Shawnee Knapp, MD  Multiple Vitamin (DAILY-VITAMIN) TABS Take 1 tablet by mouth daily.    Yes Historical Provider, MD  clopidogrel (PLAVIX) 75 MG tablet Take 1 tablet (75 mg total) by mouth daily. Patient not taking: Reported on 12/27/2015 10/19/14    Penni Bombard, MD  cyclobenzaprine (FLEXERIL) 5 MG tablet Take 1 tablet (5 mg total) by mouth 3 (three) times daily as needed for muscle spasms. Patient not taking: Reported on 12/27/2015 12/24/14   Roselee Culver, MD  meloxicam (MOBIC) 15 MG tablet Take 1 tablet (15 mg total) by mouth daily. Patient not taking: Reported on 12/27/2015 12/24/14   Roselee Culver, MD   BP 165/90 mmHg  Pulse 48  Temp(Src) 98.2 F (36.8 C) (Oral)  Resp 18  Ht 5\' 11"  (1.803 m)  Wt 83.915 kg  BMI 25.81 kg/m2  SpO2 98% Physical Exam  Constitutional: He appears well-developed and well-nourished. No distress.  Nontoxic appearing.  HENT:  Head: Normocephalic and atraumatic.  Mouth/Throat: Oropharynx is clear and moist.  Eyes: Conjunctivae are normal. Pupils are equal, round, and reactive to light. Right eye exhibits no discharge. Left eye exhibits no discharge.  Neck: Normal range of motion. Neck supple. No JVD present. No tracheal deviation present.  Cardiovascular: Regular rhythm, normal heart sounds and intact distal pulses.  Exam reveals no gallop and no friction rub.   No murmur heard. Heart rate is 54. Bilateral radial, posterior tibialis and dorsalis pedis pulses are intact.    Pulmonary/Chest: Effort normal and breath sounds normal. No stridor. No respiratory distress. He has no wheezes. He has no rales. He exhibits no tenderness.  Lungs are clear to auscultation bilaterally. No chest wall tenderness to palpation.  Abdominal: Soft. He exhibits no distension. There is no tenderness. There is no guarding.  Musculoskeletal: He exhibits no edema or tenderness.  No lower extremity edema or tenderness.  Lymphadenopathy:    He has no cervical adenopathy.  Neurological: He is alert. Coordination normal.  Skin: Skin is warm and dry. No rash noted. He is not diaphoretic. No erythema. No pallor.  Psychiatric: He has a normal mood and affect. His behavior is normal.  Nursing note and vitals  reviewed.   ED Course  Procedures (including critical care time) Labs Review Labs Reviewed  BASIC METABOLIC PANEL - Abnormal; Notable for the following:    Glucose, Bld 103 (*)    All other components within normal limits  CBC WITH DIFFERENTIAL/PLATELET - Abnormal; Notable for the following:    RBC 4.01 (*)    Hemoglobin 12.9 (*)    HCT 37.3 (*)    All other components within normal limits  I-STAT TROPOININ, ED    Imaging Review Dg Chest 2 View  12/27/2015  CLINICAL DATA:  Acute onset of left-sided chest pain beginning today. EXAM: CHEST  2 VIEW COMPARISON:  09/20/2009 FINDINGS: Heart size is normal. The aorta is slightly ectatic. The pulmonary vascularity is normal. The lungs are clear. No effusions. No significant bone finding. IMPRESSION: No active disease Electronically Signed   By: Nelson Chimes M.D.   On: 12/27/2015 18:21   I have personally reviewed and evaluated these images  and lab results as part of my medical decision-making.   EKG Interpretation   Date/Time:  Wednesday December 27 2015 17:38:52 EDT Ventricular Rate:  54 PR Interval:  154 QRS Duration: 100 QT Interval:  419 QTC Calculation: 397 R Axis:   25 Text Interpretation:  Sinus rhythm Probable left atrial enlargement Left  ventricular hypertrophy Anterior Q waves, possibly due to LVH Borderline T  abnormalities, inferior leads Confirmed by PICKERING  MD, NATHAN [phone])  on 12/27/2015 6:00:32 PM      Filed Vitals:   12/27/15 1800 12/27/15 1845 12/27/15 1900 12/27/15 1915  BP: 178/94 160/95 185/88 165/90  Pulse: 51 44 47 48  Temp:      TempSrc:      Resp: 18 21 17 18   Height:      Weight:      SpO2: 99% 99% 98% 98%     MDM   Meds given in ED:  Medications - No data to display  New Prescriptions   No medications on file    Final diagnoses:  Chest pain, unspecified chest pain type   This  is a 70 y.o. male with a history of hypertension who presents to the ED via EMS complaining of intermittent  left-sided chest pain ongoing for the past 3 days. Patient reports he's had intermittent left-sided chest pain that lasts several seconds then resolves. He is unable to identify alleviating or aggravating factors. He denies pain with exertion. He denies shortness of breath. She reports history of hypertension and is compliant with his medications. The patient was seen at urgent care today and was transported by EMS to the emergency department. EMS obtained a 12-lead showing ST elevation in his anterior leads and activated a code STEMI. On arrival to the emergency department repeat EKGs revealed that these are unchanged from his previous tracing. The patient has been chest pain-free since around 5 PM today. He reports no chest pain since arrival to the emergency department. On exam the patient is afebrile nontoxic appearing. Heart and lungs are clear to auscultation bilaterally. No lower extremity edema or tenderness. EKG shows some T-wave abnormalities that are essentially unchanged from his last tracing. No STEMI. Initial troponin is 0.01. BMP is unremarkable. CBC is unremarkable. Chest x-ray shows no active disease. Based on chest pain story with fleeting chest pain lasting seconds that is not exertional, I have low suspicion for ACS. Will obtain delta troponin and if this is not elevated I think the patient can be discharged with follow up by primary care and cardiology.  At shift change patient is awaiting delta troponin. Patient care handed off to Dr. Alvino Chapel who will disposition the patient accordingly after second troponin.    Waynetta Pean, PA-C 12/27/15 2013  Davonna Belling, MD 12/27/15 2211

## 2016-01-10 ENCOUNTER — Other Ambulatory Visit: Payer: Self-pay | Admitting: Family Medicine

## 2016-05-01 ENCOUNTER — Telehealth: Payer: Self-pay | Admitting: Internal Medicine

## 2016-05-01 NOTE — Telephone Encounter (Signed)
Left message on home phone asking pt to call office °Pt needs to schedule medicare wellness appointment before 06/08/16 per brian email dated 05/01/16 °

## 2016-06-03 ENCOUNTER — Ambulatory Visit (INDEPENDENT_AMBULATORY_CARE_PROVIDER_SITE_OTHER): Payer: Commercial Managed Care - HMO | Admitting: Internal Medicine

## 2016-06-03 ENCOUNTER — Encounter: Payer: Self-pay | Admitting: Internal Medicine

## 2016-06-03 VITALS — BP 186/104 | HR 62 | Temp 98.7°F | Ht 70.0 in | Wt 196.5 lb

## 2016-06-03 DIAGNOSIS — G459 Transient cerebral ischemic attack, unspecified: Secondary | ICD-10-CM | POA: Diagnosis not present

## 2016-06-03 DIAGNOSIS — Z23 Encounter for immunization: Secondary | ICD-10-CM

## 2016-06-03 DIAGNOSIS — Z125 Encounter for screening for malignant neoplasm of prostate: Secondary | ICD-10-CM

## 2016-06-03 DIAGNOSIS — R7309 Other abnormal glucose: Secondary | ICD-10-CM

## 2016-06-03 DIAGNOSIS — I10 Essential (primary) hypertension: Secondary | ICD-10-CM

## 2016-06-03 DIAGNOSIS — Z136 Encounter for screening for cardiovascular disorders: Secondary | ICD-10-CM | POA: Diagnosis not present

## 2016-06-03 DIAGNOSIS — Z1159 Encounter for screening for other viral diseases: Secondary | ICD-10-CM

## 2016-06-03 DIAGNOSIS — Z0000 Encounter for general adult medical examination without abnormal findings: Secondary | ICD-10-CM

## 2016-06-03 MED ORDER — LISINOPRIL-HYDROCHLOROTHIAZIDE 20-25 MG PO TABS: 1.0000 | ORAL_TABLET | Freq: Every day | ORAL | 3 refills | Status: DC

## 2016-06-03 NOTE — Assessment & Plan Note (Signed)
Discussed the importance of tight BP control Lipid Profile today Continue ASA

## 2016-06-03 NOTE — Patient Instructions (Signed)

## 2016-06-03 NOTE — Assessment & Plan Note (Signed)
Uncontrolled secondary to medication noncompliance Discussed importance of BP control Medication refilled CBC and CMET today

## 2016-06-03 NOTE — Addendum Note (Signed)
Addended by: Lurlean Nanny on: 06/03/2016 04:37 PM   Modules accepted: Orders

## 2016-06-03 NOTE — Progress Notes (Signed)
HPI:  Pt presents to the clinic today for his Medicare Wellness Exam. He is also due to follow up chronic conditions.  HTN: His BP today is 190/110. He is prescribed Lisinopril HCT but reports he is not taking it. He denies headache, dizziness, blurred vision, chest pain or shortness of breath. ECG from 12/2015 reviewed.  TIA: He is prescribed Plavix but reports he is not taking it. He takes a baby ASA instead. He is not taking anything for cholesterol but does try to consume a low fat diet. Follows with Dr. Leta Baptist.  Past Medical History:  Diagnosis Date  . Chicken pox   . Hypertension   . Right eye injury    pt has glass eye    Current Outpatient Prescriptions  Medication Sig Dispense Refill  . aspirin 81 MG tablet Take 81 mg by mouth daily.    . clopidogrel (PLAVIX) 75 MG tablet Take 1 tablet (75 mg total) by mouth daily. 30 tablet 11  . cyclobenzaprine (FLEXERIL) 5 MG tablet Take 1 tablet (5 mg total) by mouth 3 (three) times daily as needed for muscle spasms. 30 tablet 0  . lisinopril-hydrochlorothiazide (PRINZIDE,ZESTORETIC) 20-25 MG tablet TAKE ONE TABLET BY MOUTH DAILY. 90 tablet 0  . meloxicam (MOBIC) 15 MG tablet Take 1 tablet (15 mg total) by mouth daily. 30 tablet 0  . Multiple Vitamin (DAILY-VITAMIN) TABS Take 1 tablet by mouth daily.      No current facility-administered medications for this visit.     No Known Allergies  Family History  Problem Relation Age of Onset  . Hypertension Sister   . Hypertension Sister   . Diabetes Brother   . Cancer Neg Hx   . Heart disease Neg Hx   . Stroke Neg Hx     Social History   Social History  . Marital status: Married    Spouse name: Bonnita Nasuti  . Number of children: 7  . Years of education: N/A   Occupational History  . welder---retired    Social History Main Topics  . Smoking status: Former Smoker    Packs/day: 1.50    Years: 20.00    Types: Cigarettes    Quit date: 10/11/2003  . Smokeless tobacco: Never Used  .  Alcohol use 0.6 oz/week    1 Cans of beer per week     Comment: half to a whole beer - occasionally (social)  . Drug use: No  . Sexual activity: Not on file   Other Topics Concern  . Not on file   Social History Narrative   Lives with wife at home       Hospitiliaztions: None  Health Maintenance:    Flu: 10/2013  Tetanus: 10/2013  Pneumovax: 10/2011  Prevnar: never  Zostavax: never  PSA: 10/2013  Colon Screening: 10/2011  Eye Doctor: 12/2015  Dental Exam: as needed   Providers:   PCP: Webb Silversmith, NP-C  Ohpthalmology: Dr. Heide Spark  Neurologist: Dr. Leta Baptist   I have personally reviewed and have noted:  1. The patient's medical and social history 2. Their use of alcohol, tobacco or illicit drugs 3. Their current medications and supplements 4. The patient's functional ability including ADL's, fall risks, home safety risks and hearing or visual impairment. 5. Diet and physical activities 6. Evidence for depression or mood disorder  Subjective:   Review of Systems:   Constitutional: Denies fever, malaise, fatigue, headache or abrupt weight changes.  HEENT: Denies eye pain, eye redness, ear pain, ringing in the ears,  wax buildup, runny nose, nasal congestion, bloody nose, or sore throat. Respiratory: Denies difficulty breathing, shortness of breath, cough or sputum production.   Cardiovascular: Denies chest pain, chest tightness, palpitations or swelling in the hands or feet.  Gastrointestinal: Pt reports occasional reflux. Denies abdominal pain, bloating, constipation, diarrhea or blood in the stool.  GU: Denies urgency, frequency, pain with urination, burning sensation, blood in urine, odor or discharge. Musculoskeletal: Denies decrease in range of motion, difficulty with gait, muscle pain or joint pain and swelling.  Skin: Denies redness, rashes, lesions or ulcercations.  Neurological: Denies dizziness, difficulty with memory, difficulty with speech or problems with  balance and coordination.  Psych: Denies anxiety, depression, SI/HI.  No other specific complaints in a complete review of systems (except as listed in HPI above).  Objective:  PE:   BP (!) 190/110   Pulse 62   Temp 98.7 F (37.1 C) (Oral)   Ht 5\' 10"  (1.778 m)   Wt 196 lb 8 oz (89.1 kg)   SpO2 98%   BMI 28.19 kg/m   Wt Readings from Last 3 Encounters:  12/27/15 185 lb (83.9 kg)  12/27/15 199 lb (90.3 kg)  08/18/15 196 lb (88.9 kg)    General: Appears his stated age, well developed, well nourished in NAD. Skin: Warm, dry and intact.  HEENT: Head: normal shape and size; Eyes: sclera white, no icterus, conjunctiva pink, PERRLA and EOMs intact;   Cardiovascular: Normal rate and rhythm. S1,S2 noted.  No murmur, rubs or gallops noted. No JVD or BLE edema. No carotid bruits noted. Pulmonary/Chest: Normal effort and positive vesicular breath sounds. No respiratory distress. No wheezes, rales or ronchi noted.  Abdomen: Soft and nontender. Normal bowel sounds. No distention or masses noted.  Musculoskeletal: No difficulty with gait. Neurological: Alert and oriented.  Psychiatric: Mood and affect normal. Behavior is normal. Judgment and thought content normal.     BMET    Component Value Date/Time   NA 139 12/27/2015 1755   K 3.9 12/27/2015 1755   CL 104 12/27/2015 1755   CO2 26 12/27/2015 1755   GLUCOSE 103 (H) 12/27/2015 1755   BUN 13 12/27/2015 1755   CREATININE 1.08 12/27/2015 1755   CREATININE 1.07 09/17/2014 1102   CALCIUM 9.0 12/27/2015 1755   GFRNONAA >60 12/27/2015 1755   GFRAA >60 12/27/2015 1755    Lipid Panel     Component Value Date/Time   CHOL 175 11/01/2013 1159   TRIG 85.0 11/01/2013 1159   HDL 64.80 11/01/2013 1159   CHOLHDL 3 11/01/2013 1159   VLDL 17.0 11/01/2013 1159   LDLCALC 93 11/01/2013 1159    CBC    Component Value Date/Time   WBC 5.0 12/27/2015 1755   RBC 4.01 (L) 12/27/2015 1755   HGB 12.9 (L) 12/27/2015 1755   HCT 37.3 (L)  12/27/2015 1755   PLT 166 12/27/2015 1755   MCV 93.0 12/27/2015 1755   MCV 95.1 09/17/2014 1110   MCH 32.2 12/27/2015 1755   MCHC 34.6 12/27/2015 1755   RDW 12.2 12/27/2015 1755   LYMPHSABS 2.3 12/27/2015 1755   MONOABS 0.5 12/27/2015 1755   EOSABS 0.1 12/27/2015 1755   BASOSABS 0.0 12/27/2015 1755    Hgb A1C No results found for: HGBA1C    Assessment and Plan:   Medicare Annual Wellness Visit:  Diet: He does eat meat. He consumes fruits and veggies daily. He tries to avoid fried foods. He drinks mostly water and lemonade. Physical activity: He is walking 5  days a week for about 10-15 minutes. Depression/mood screen: Negative Hearing: Intact to whispered voice Visual acuity: Grossly normal, wears readers ADLs: Capable Fall risk: None Home safety: Good Cognitive evaluation: Intact to orientation, naming, recall and repetition EOL planning: No adv directives, full code/ I agree  Preventative Medicine: Flu and Prevnar given today. Tetanus and Pneumovax UTD. Will discuss Zostovax at next visit. Colon Screening UTD. Encouraged him to consume a balanced diet and exercise regimen. Advised him to see an eye doctor and dentist annually. Will check CBC, CMET, Lipid, A1C, PSA and Hep C today   Next appointment: 2 weeks, follow up BP   Terry Knick, NP

## 2016-06-04 LAB — LIPID PANEL
CHOL/HDL RATIO: 2
CHOLESTEROL: 167 mg/dL (ref 0–200)
HDL: 78.6 mg/dL (ref >39.00)
LDL Cholesterol: 63 mg/dL (ref 0–99)
NonHDL: 88.86
TRIGLYCERIDES: 127 mg/dL (ref 0.0–149.0)
VLDL: 25.4 mg/dL (ref 0.0–40.0)

## 2016-06-04 LAB — COMPREHENSIVE METABOLIC PANEL
ALK PHOS: 75 U/L (ref 39–117)
ALT: 20 U/L (ref 0–53)
AST: 16 U/L (ref 0–37)
Albumin: 4.2 g/dL (ref 3.5–5.2)
BILIRUBIN TOTAL: 0.6 mg/dL (ref 0.2–1.2)
BUN: 19 mg/dL (ref 6–23)
CALCIUM: 9.7 mg/dL (ref 8.4–10.5)
CO2: 31 meq/L (ref 19–32)
CREATININE: 1.22 mg/dL (ref 0.40–1.50)
Chloride: 107 mEq/L (ref 96–112)
GFR: 75.9 mL/min (ref >60.00)
Glucose, Bld: 103 mg/dL — ABNORMAL HIGH (ref 70–99)
Potassium: 4.4 mEq/L (ref 3.5–5.1)
Sodium: 145 mEq/L (ref 135–145)
TOTAL PROTEIN: 7.3 g/dL (ref 6.0–8.3)

## 2016-06-04 LAB — PSA, MEDICARE: PSA: 2.61 ng/mL (ref 0.10–4.00)

## 2016-06-04 LAB — CBC
HCT: 40.6 % (ref 39.0–52.0)
HEMOGLOBIN: 14.1 g/dL (ref 13.0–17.0)
MCHC: 34.7 g/dL (ref 30.0–36.0)
MCV: 94 fl (ref 78.0–100.0)
PLATELETS: 180 10*3/uL (ref 150.0–400.0)
RBC: 4.32 Mil/uL (ref 4.22–5.81)
RDW: 13.1 % (ref 11.5–15.5)
WBC: 5.7 10*3/uL (ref 4.0–10.5)

## 2016-06-04 LAB — HEPATITIS C ANTIBODY: HCV AB: NEGATIVE

## 2016-06-04 LAB — HEMOGLOBIN A1C: Hgb A1c MFr Bld: 5.9 % (ref 4.6–6.5)

## 2016-06-18 ENCOUNTER — Ambulatory Visit: Payer: Self-pay | Admitting: Internal Medicine

## 2016-06-18 DIAGNOSIS — Z0289 Encounter for other administrative examinations: Secondary | ICD-10-CM

## 2016-06-20 ENCOUNTER — Ambulatory Visit (INDEPENDENT_AMBULATORY_CARE_PROVIDER_SITE_OTHER): Payer: Commercial Managed Care - HMO | Admitting: Internal Medicine

## 2016-06-20 ENCOUNTER — Encounter: Payer: Self-pay | Admitting: Internal Medicine

## 2016-06-20 DIAGNOSIS — I10 Essential (primary) hypertension: Secondary | ICD-10-CM

## 2016-06-20 MED ORDER — AMLODIPINE BESYLATE 10 MG PO TABS: 10.0000 mg | ORAL_TABLET | Freq: Every day | ORAL | 0 refills | Status: DC

## 2016-06-20 NOTE — Progress Notes (Signed)
Subjective:    Patient ID: Terry Russo, male    DOB: [birth year], 70 y.o.   MRN: [id number]  HPI  Pt presents to the clinic today for 2 week follow up of HTN. At his appt on 06/03/16, his BP was 186/104. He reports he had not been taking his Lisinopril-HCT as prescribed. We discussed the importance of medication compliance to reduce his risk of heart attack, stroke or death. He was advised to start taking his medications and return in 2 weeks for BP follow up. He no showed his follow up appt on 06/18/16. He has been taking the medication as prescribed. His BP today is 160/84.  Review of Systems   Past Medical History:  Diagnosis Date  . Chicken pox   . Hypertension   . Right eye injury    pt has glass eye    Current Outpatient Prescriptions  Medication Sig Dispense Refill  . aspirin 81 MG tablet Take 81 mg by mouth daily.    Marland Kitchen lisinopril-hydrochlorothiazide (PRINZIDE,ZESTORETIC) 20-25 MG tablet Take 1 tablet by mouth daily. 90 tablet 3  . Multiple Vitamin (DAILY-VITAMIN) TABS Take 1 tablet by mouth daily.      No current facility-administered medications for this visit.     No Known Allergies  Family History  Problem Relation Age of Onset  . Hypertension Sister   . Hypertension Sister   . Diabetes Brother   . Cancer Neg Hx   . Heart disease Neg Hx   . Stroke Neg Hx     Social History   Social History  . Marital status: Married    Spouse name: Bonnita Nasuti  . Number of children: 7  . Years of education: N/A   Occupational History  . welder---retired    Social History Main Topics  . Smoking status: Former Smoker    Packs/day: 1.50    Years: 20.00    Types: Cigarettes    Quit date: 10/11/2003  . Smokeless tobacco: Never Used  . Alcohol use 0.6 oz/week    1 Cans of beer per week     Comment: half to a whole beer - occasionally (social)  . Drug use: No  . Sexual activity: Not on file   Other Topics Concern  . Not on file   Social History Narrative   Lives with  wife at home        Constitutional: Denies fever, malaise, fatigue, headache or abrupt weight changes.  HEENT: Denies eye pain, eye redness, ear pain, ringing in the ears, wax buildup, runny nose, nasal congestion, bloody nose, or sore throat. Respiratory: Denies difficulty breathing, shortness of breath, cough or sputum production.   Cardiovascular: Denies chest pain, chest tightness, palpitations or swelling in the hands or feet.  Neurological: Denies dizziness, difficulty with memory, difficulty with speech or problems with balance and coordination.    No other specific complaints in a complete review of systems (except as listed in HPI above).  Objective:   Physical Exam   BP (!) 160/84   Pulse 83   Temp 98.2 F (36.8 C) (Oral)   Wt 192 lb 12 oz (87.4 kg)   SpO2 98%   BMI 27.66 kg/m  Wt Readings from Last 3 Encounters:  06/20/16 192 lb 12 oz (87.4 kg)  06/03/16 196 lb 8 oz (89.1 kg)  12/27/15 185 lb (83.9 kg)    General: Appears his stated age, well developed, well nourished in NAD. Cardiovascular: Normal rate and rhythm. S1,S2 noted.  No  murmur, rubs or gallops noted.  Pulmonary/Chest: Normal effort and positive vesicular breath sounds. No respiratory distress. No wheezes, rales or ronchi noted.  Neurological: Alert and oriented.   BMET    Component Value Date/Time   NA 145 06/03/2016 1614   K 4.4 06/03/2016 1614   CL 107 06/03/2016 1614   CO2 31 06/03/2016 1614   GLUCOSE 103 (H) 06/03/2016 1614   BUN 19 06/03/2016 1614   CREATININE 1.22 06/03/2016 1614   CREATININE 1.07 09/17/2014 1102   CALCIUM 9.7 06/03/2016 1614   GFRNONAA >60 12/27/2015 1755   GFRAA >60 12/27/2015 1755    Lipid Panel     Component Value Date/Time   CHOL 167 06/03/2016 1614   TRIG 127.0 06/03/2016 1614   HDL 78.60 06/03/2016 1614   CHOLHDL 2 06/03/2016 1614   VLDL 25.4 06/03/2016 1614   LDLCALC 63 06/03/2016 1614    CBC    Component Value Date/Time   WBC 5.7 06/03/2016 1614     RBC 4.32 06/03/2016 1614   HGB 14.1 06/03/2016 1614   HCT 40.6 06/03/2016 1614   PLT 180.0 06/03/2016 1614   MCV 94.0 06/03/2016 1614   MCV 95.1 09/17/2014 1110   MCH 32.2 12/27/2015 1755   MCHC 34.7 06/03/2016 1614   RDW 13.1 06/03/2016 1614   LYMPHSABS 2.3 12/27/2015 1755   MONOABS 0.5 12/27/2015 1755   EOSABS 0.1 12/27/2015 1755   BASOSABS 0.0 12/27/2015 1755    Hgb A1C Lab Results  Component Value Date   HGBA1C 5.9 06/03/2016       Assessment & Plan:

## 2016-06-20 NOTE — Patient Instructions (Signed)
Hypertension Hypertension, commonly called high blood pressure, is when the force of blood pumping through your arteries is too strong. Your arteries are the blood vessels that carry blood from your heart throughout your body. A blood pressure reading consists of a higher number over a lower number, such as 110/72. The higher number (systolic) is the pressure inside your arteries when your heart pumps. The lower number (diastolic) is the pressure inside your arteries when your heart relaxes. Ideally you want your blood pressure below 120/80. Hypertension forces your heart to work harder to pump blood. Your arteries may become narrow or stiff. Having untreated or uncontrolled hypertension can cause heart attack, stroke, kidney disease, and other problems. RISK FACTORS Some risk factors for high blood pressure are controllable. Others are not.  Risk factors you cannot control include:   Race. You may be at higher risk if you are African American.  Age. Risk increases with age.  Gender. Men are at higher risk than women before age 45 years. After age 65, women are at higher risk than men. Risk factors you can control include:  Not getting enough exercise or physical activity.  Being overweight.  Getting too much fat, sugar, calories, or salt in your diet.  Drinking too much alcohol. SIGNS AND SYMPTOMS Hypertension does not usually cause signs or symptoms. Extremely high blood pressure (hypertensive crisis) may cause headache, anxiety, shortness of breath, and nosebleed. DIAGNOSIS To check if you have hypertension, your health care provider will measure your blood pressure while you are seated, with your arm held at the level of your heart. It should be measured at least twice using the same arm. Certain conditions can cause a difference in blood pressure between your right and left arms. A blood pressure reading that is higher than normal on one occasion does not mean that you need treatment. If  it is not clear whether you have high blood pressure, you may be asked to return on a different day to have your blood pressure checked again. Or, you may be asked to monitor your blood pressure at home for 1 or more weeks. TREATMENT Treating high blood pressure includes making lifestyle changes and possibly taking medicine. Living a healthy lifestyle can help lower high blood pressure. You may need to change some of your habits. Lifestyle changes may include:  Following the DASH diet. This diet is high in fruits, vegetables, and whole grains. It is low in salt, red meat, and added sugars.  Keep your sodium intake below 2,300 mg per day.  Getting at least 30-45 minutes of aerobic exercise at least 4 times per week.  Losing weight if necessary.  Not smoking.  Limiting alcoholic beverages.  Learning ways to reduce stress. Your health care provider may prescribe medicine if lifestyle changes are not enough to get your blood pressure under control, and if one of the following is true:  You are 18-59 years of age and your systolic blood pressure is above 140.  You are 60 years of age or older, and your systolic blood pressure is above 150.  Your diastolic blood pressure is above 90.  You have diabetes, and your systolic blood pressure is over 140 or your diastolic blood pressure is over 90.  You have kidney disease and your blood pressure is above 140/90.  You have heart disease and your blood pressure is above 140/90. Your personal target blood pressure may vary depending on your medical conditions, your age, and other factors. HOME CARE INSTRUCTIONS    Have your blood pressure rechecked as directed by your health care provider.   Take medicines only as directed by your health care provider. Follow the directions carefully. Blood pressure medicines must be taken as prescribed. The medicine does not work as well when you skip doses. Skipping doses also puts you at risk for  problems.  Do not smoke.   Monitor your blood pressure at home as directed by your health care provider. SEEK MEDICAL CARE IF:   You think you are having a reaction to medicines taken.  You have recurrent headaches or feel dizzy.  You have swelling in your ankles.  You have trouble with your vision. SEEK IMMEDIATE MEDICAL CARE IF:  You develop a severe headache or confusion.  You have unusual weakness, numbness, or feel faint.  You have severe chest or abdominal pain.  You vomit repeatedly.  You have trouble breathing. MAKE SURE YOU:   Understand these instructions.  Will watch your condition.  Will get help right away if you are not doing well or get worse.   This information is not intended to replace advice given to you by your health care provider. Make sure you discuss any questions you have with your health care provider.   Document Released: 08/26/2005 Document Revised: 01/10/2015 Document Reviewed: 06/18/2013 Elsevier Interactive Patient Education 2016 [employer]  

## 2016-06-20 NOTE — Assessment & Plan Note (Signed)
Still not at goal Continue Lisinopril HCT Add Amlodipine 10 mg daily  RTC in 2 weeks for BP recheck

## 2016-07-04 ENCOUNTER — Ambulatory Visit (INDEPENDENT_AMBULATORY_CARE_PROVIDER_SITE_OTHER): Payer: Commercial Managed Care - HMO | Admitting: Internal Medicine

## 2016-07-04 ENCOUNTER — Encounter: Payer: Self-pay | Admitting: Internal Medicine

## 2016-07-04 DIAGNOSIS — I10 Essential (primary) hypertension: Secondary | ICD-10-CM

## 2016-07-04 MED ORDER — AMLODIPINE BESYLATE 10 MG PO TABS: 10.0000 mg | ORAL_TABLET | Freq: Every day | ORAL | 3 refills | Status: DC

## 2016-07-04 NOTE — Assessment & Plan Note (Signed)
Now at goal Continue Lisinopril HCT and Amlodipine Medication refilled x 1 year  RTC in 1 year for Medicare Wellness Exam

## 2016-07-04 NOTE — Progress Notes (Signed)
Subjective:    Patient ID: Terry Russo, male    DOB: 1946-08-30, 70 y.o.   MRN: [id number]  HPI  Pt presents to the clinic today for follow up of HTN. Amlodipine was added to his regimen 2 weeks ago. He has been taking that and the Lisinopril-HCTZ as prescribed. He denies adverse side effects. His BP today is 140/86. ECG from 12/2015 reviewed.  Review of Systems      Past Medical History:  Diagnosis Date  . Chicken pox   . Hypertension   . Right eye injury    pt has glass eye    Current Outpatient Prescriptions  Medication Sig Dispense Refill  . amLODipine (NORVASC) 10 MG tablet Take 1 tablet (10 mg total) by mouth daily. 90 tablet 3  . aspirin 81 MG tablet Take 81 mg by mouth daily.    Marland Kitchen lisinopril-hydrochlorothiazide (PRINZIDE,ZESTORETIC) 20-25 MG tablet Take 1 tablet by mouth daily. 90 tablet 3  . Multiple Vitamin (DAILY-VITAMIN) TABS Take 1 tablet by mouth daily.      No current facility-administered medications for this visit.     No Known Allergies  Family History  Problem Relation Age of Onset  . Hypertension Sister   . Hypertension Sister   . Diabetes Brother   . Cancer Neg Hx   . Heart disease Neg Hx   . Stroke Neg Hx     Social History   Social History  . Marital status: Married    Spouse name: Bonnita Nasuti  . Number of children: 7  . Years of education: N/A   Occupational History  . welder---retired    Social History Main Topics  . Smoking status: Former Smoker    Packs/day: 1.50    Years: 20.00    Types: Cigarettes    Quit date: 10/11/2003  . Smokeless tobacco: Never Used  . Alcohol use 0.6 oz/week    1 Cans of beer per week     Comment: half to a whole beer - occasionally (social)  . Drug use: No  . Sexual activity: Not on file   Other Topics Concern  . Not on file   Social History Narrative   Lives with wife at home        Constitutional: Denies fever, malaise, fatigue, headache or abrupt weight changes.  Respiratory: Denies  difficulty breathing, shortness of breath, cough or sputum production.   Cardiovascular: Denies chest pain, chest tightness, palpitations or swelling in the hands or feet.  Neurological: Denies dizziness, difficulty with memory, difficulty with speech or problems with balance and coordination.    No other specific complaints in a complete review of systems (except as listed in HPI above).  Objective:   Physical Exam  BP 140/86   Pulse 68   Temp 98.8 F (37.1 C) (Oral)   Wt 193 lb (87.5 kg)   SpO2 97%   BMI 27.69 kg/m  Wt Readings from Last 3 Encounters:  07/04/16 193 lb (87.5 kg)  06/20/16 192 lb 12 oz (87.4 kg)  06/03/16 196 lb 8 oz (89.1 kg)    General: Appears his stated age, well developed, well nourished in NAD. Cardiovascular: Normal rate and rhythm. S1,S2 noted.   Pulmonary/Chest: Normal effort and positive vesicular breath sounds. No respiratory distress. No wheezes, rales or ronchi noted.  Neurological: Alert and oriented.   BMET    Component Value Date/Time   NA 145 06/03/2016 1614   K 4.4 06/03/2016 1614   CL 107 06/03/2016 1614  CO2 31 06/03/2016 1614   GLUCOSE 103 (H) 06/03/2016 1614   BUN 19 06/03/2016 1614   CREATININE 1.22 06/03/2016 1614   CREATININE 1.07 09/17/2014 1102   CALCIUM 9.7 06/03/2016 1614   GFRNONAA >60 12/27/2015 1755   GFRAA >60 12/27/2015 1755    Lipid Panel     Component Value Date/Time   CHOL 167 06/03/2016 1614   TRIG 127.0 06/03/2016 1614   HDL 78.60 06/03/2016 1614   CHOLHDL 2 06/03/2016 1614   VLDL 25.4 06/03/2016 1614   LDLCALC 63 06/03/2016 1614    CBC    Component Value Date/Time   WBC 5.7 06/03/2016 1614   RBC 4.32 06/03/2016 1614   HGB 14.1 06/03/2016 1614   HCT 40.6 06/03/2016 1614   PLT 180.0 06/03/2016 1614   MCV 94.0 06/03/2016 1614   MCV 95.1 09/17/2014 1110   MCH 32.2 12/27/2015 1755   MCHC 34.7 06/03/2016 1614   RDW 13.1 06/03/2016 1614   LYMPHSABS 2.3 12/27/2015 1755   MONOABS 0.5 12/27/2015 1755    EOSABS 0.1 12/27/2015 1755   BASOSABS 0.0 12/27/2015 1755    Hgb A1C Lab Results  Component Value Date   HGBA1C 5.9 06/03/2016            Assessment & Plan:

## 2016-07-04 NOTE — Patient Instructions (Signed)
Hypertension Hypertension, commonly called high blood pressure, is when the force of blood pumping through your arteries is too strong. Your arteries are the blood vessels that carry blood from your heart throughout your body. A blood pressure reading consists of a higher number over a lower number, such as 110/72. The higher number (systolic) is the pressure inside your arteries when your heart pumps. The lower number (diastolic) is the pressure inside your arteries when your heart relaxes. Ideally you want your blood pressure below 120/80. Hypertension forces your heart to work harder to pump blood. Your arteries may become narrow or stiff. Having untreated or uncontrolled hypertension can cause heart attack, stroke, kidney disease, and other problems. RISK FACTORS Some risk factors for high blood pressure are controllable. Others are not.  Risk factors you cannot control include:   Race. You may be at higher risk if you are African American.  Age. Risk increases with age.  Gender. Men are at higher risk than women before age 45 years. After age 65, women are at higher risk than men. Risk factors you can control include:  Not getting enough exercise or physical activity.  Being overweight.  Getting too much fat, sugar, calories, or salt in your diet.  Drinking too much alcohol. SIGNS AND SYMPTOMS Hypertension does not usually cause signs or symptoms. Extremely high blood pressure (hypertensive crisis) may cause headache, anxiety, shortness of breath, and nosebleed. DIAGNOSIS To check if you have hypertension, your health care provider will measure your blood pressure while you are seated, with your arm held at the level of your heart. It should be measured at least twice using the same arm. Certain conditions can cause a difference in blood pressure between your right and left arms. A blood pressure reading that is higher than normal on one occasion does not mean that you need treatment. If  it is not clear whether you have high blood pressure, you may be asked to return on a different day to have your blood pressure checked again. Or, you may be asked to monitor your blood pressure at home for 1 or more weeks. TREATMENT Treating high blood pressure includes making lifestyle changes and possibly taking medicine. Living a healthy lifestyle can help lower high blood pressure. You may need to change some of your habits. Lifestyle changes may include:  Following the DASH diet. This diet is high in fruits, vegetables, and whole grains. It is low in salt, red meat, and added sugars.  Keep your sodium intake below 2,300 mg per day.  Getting at least 30-45 minutes of aerobic exercise at least 4 times per week.  Losing weight if necessary.  Not smoking.  Limiting alcoholic beverages.  Learning ways to reduce stress. Your health care provider may prescribe medicine if lifestyle changes are not enough to get your blood pressure under control, and if one of the following is true:  You are 18-59 years of age and your systolic blood pressure is above 140.  You are 60 years of age or older, and your systolic blood pressure is above 150.  Your diastolic blood pressure is above 90.  You have diabetes, and your systolic blood pressure is over 140 or your diastolic blood pressure is over 90.  You have kidney disease and your blood pressure is above 140/90.  You have heart disease and your blood pressure is above 140/90. Your personal target blood pressure may vary depending on your medical conditions, your age, and other factors. HOME CARE INSTRUCTIONS    Have your blood pressure rechecked as directed by your health care provider.   Take medicines only as directed by your health care provider. Follow the directions carefully. Blood pressure medicines must be taken as prescribed. The medicine does not work as well when you skip doses. Skipping doses also puts you at risk for  problems.  Do not smoke.   Monitor your blood pressure at home as directed by your health care provider. SEEK MEDICAL CARE IF:   You think you are having a reaction to medicines taken.  You have recurrent headaches or feel dizzy.  You have swelling in your ankles.  You have trouble with your vision. SEEK IMMEDIATE MEDICAL CARE IF:  You develop a severe headache or confusion.  You have unusual weakness, numbness, or feel faint.  You have severe chest or abdominal pain.  You vomit repeatedly.  You have trouble breathing. MAKE SURE YOU:   Understand these instructions.  Will watch your condition.  Will get help right away if you are not doing well or get worse.   This information is not intended to replace advice given to you by your health care provider. Make sure you discuss any questions you have with your health care provider.   Document Released: 08/26/2005 Document Revised: 01/10/2015 Document Reviewed: 06/18/2013 Elsevier Interactive Patient Education 2016 [employer]  

## 2016-10-08 ENCOUNTER — Emergency Department (HOSPITAL_COMMUNITY): Payer: Medicare HMO

## 2016-10-08 ENCOUNTER — Encounter (HOSPITAL_COMMUNITY): Payer: Self-pay | Admitting: Emergency Medicine

## 2016-10-08 ENCOUNTER — Emergency Department (HOSPITAL_COMMUNITY)
Admission: EM | Admit: 2016-10-08 | Discharge: 2016-10-08 | Disposition: A | Discharge status: Home / Self Care | Payer: Medicare HMO | Attending: Emergency Medicine | Admitting: Emergency Medicine

## 2016-10-08 ENCOUNTER — Telehealth: Payer: Self-pay | Admitting: Internal Medicine

## 2016-10-08 DIAGNOSIS — Z79899 Other long term (current) drug therapy: Secondary | ICD-10-CM | POA: Insufficient documentation

## 2016-10-08 DIAGNOSIS — R519 Headache, unspecified: Secondary | ICD-10-CM

## 2016-10-08 DIAGNOSIS — R51 Headache: Secondary | ICD-10-CM | POA: Insufficient documentation

## 2016-10-08 DIAGNOSIS — S199XXA Unspecified injury of neck, initial encounter: Secondary | ICD-10-CM | POA: Diagnosis not present

## 2016-10-08 DIAGNOSIS — S161XXA Strain of muscle, fascia and tendon at neck level, initial encounter: Secondary | ICD-10-CM | POA: Diagnosis not present

## 2016-10-08 DIAGNOSIS — I10 Essential (primary) hypertension: Secondary | ICD-10-CM | POA: Insufficient documentation

## 2016-10-08 DIAGNOSIS — S0083XA Contusion of other part of head, initial encounter: Secondary | ICD-10-CM | POA: Insufficient documentation

## 2016-10-08 DIAGNOSIS — Z8673 Personal history of transient ischemic attack (TIA), and cerebral infarction without residual deficits: Secondary | ICD-10-CM | POA: Diagnosis not present

## 2016-10-08 DIAGNOSIS — Y999 Unspecified external cause status: Secondary | ICD-10-CM | POA: Diagnosis not present

## 2016-10-08 DIAGNOSIS — Y92410 Unspecified street and highway as the place of occurrence of the external cause: Secondary | ICD-10-CM | POA: Insufficient documentation

## 2016-10-08 DIAGNOSIS — Y9389 Activity, other specified: Secondary | ICD-10-CM | POA: Insufficient documentation

## 2016-10-08 DIAGNOSIS — Z87891 Personal history of nicotine dependence: Secondary | ICD-10-CM | POA: Diagnosis not present

## 2016-10-08 DIAGNOSIS — S069X9A Unspecified intracranial injury with loss of consciousness of unspecified duration, initial encounter: Secondary | ICD-10-CM | POA: Diagnosis not present

## 2016-10-08 DIAGNOSIS — Z7982 Long term (current) use of aspirin: Secondary | ICD-10-CM | POA: Diagnosis not present

## 2016-10-08 NOTE — ED Notes (Signed)
Pt returned from CT °

## 2016-10-08 NOTE — Telephone Encounter (Signed)
Per chart review tab pt seen [facility] ED today.
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
## 2016-10-08 NOTE — ED Notes (Signed)
Pt to CT
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
## 2016-11-22 ENCOUNTER — Telehealth: Payer: Self-pay | Admitting: Internal Medicine
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
## 2016-11-22 NOTE — Telephone Encounter (Signed)
I tried to call pt but someone picked up and stated they would call back and hung up the phone

## 2016-11-22 NOTE — Telephone Encounter (Signed)
Noted. Is patient compliant to Amlodipine and lisinopril/HCTZ? What's his latest blood pressure today. If chest pain, dizziness, numbness/tingling, changes in speech then needs to go to ED immediately.

## 2016-11-22 NOTE — Telephone Encounter (Signed)
I called daughter in reference to pt's wife's results--- I have not been able to reach pt and he never called me back...so I asked the daughter if she had heard from pt in reference to his BP--- she reports that she had been trying to get in touch with him as well with no answer... Asked her if she could make sure if he is taking his meds and that if he experiences any of the Sx listed below to go to ED immediately--- she expressed understanding

## 2016-11-22 NOTE — Telephone Encounter (Signed)
Pt is known to be noncompliant with meds. Will try calling them back next week for an appt.

## 2016-11-26 ENCOUNTER — Encounter: Payer: Self-pay | Admitting: Family Medicine

## 2016-11-26 ENCOUNTER — Encounter (INDEPENDENT_AMBULATORY_CARE_PROVIDER_SITE_OTHER): Payer: Self-pay

## 2016-11-26 ENCOUNTER — Ambulatory Visit (INDEPENDENT_AMBULATORY_CARE_PROVIDER_SITE_OTHER): Payer: Medicare HMO | Admitting: Family Medicine

## 2016-11-26 ENCOUNTER — Telehealth: Payer: Self-pay | Admitting: Internal Medicine

## 2016-11-26 VITALS — BP 156/98 | HR 50 | Temp 97.9°F | Ht 70.0 in | Wt 194.5 lb

## 2016-11-26 DIAGNOSIS — I10 Essential (primary) hypertension: Secondary | ICD-10-CM

## 2016-11-26 NOTE — Patient Instructions (Signed)
Get back on both blood pressure medicines  Make sure you have both medicines on hand  Drink more water - goal 64 oz of water a day  Also watch sodium and processed foods in your diet   Let us know if you need a px (it looks like you have refills of both amlodipine and lisinopril-hct) Take care of yourself   If bp does not improve in the next week please alert [place]   Follow up with Webb Silversmith NP in about 2 weeks

## 2016-11-26 NOTE — Progress Notes (Signed)
Pre visit review using our clinic review tool, if applicable. No additional management support is needed unless otherwise documented below in the visit note. 

## 2016-11-26 NOTE — Telephone Encounter (Signed)
Pt has appt with Dr Glori Bickers on 11/26/16 at 12:30.

## 2016-11-26 NOTE — Telephone Encounter (Signed)
Pt was seen today.

## 2016-11-26 NOTE — Assessment & Plan Note (Signed)
Blood pressure is likely elevated since pt has only been taking one of his two bp medications  He will go home and organize pill box making sure he has both the amlodipine and lisinopril hct - and take whichever one he did not take this am today  Continue to watch bp f/u with pcp in approx 2 wk  Disc imp of physical activity and low sodium diet as well  Update if no improvement in readings in the meantime

## 2016-11-26 NOTE — Telephone Encounter (Signed)
Patient Name: ZEIN [facility]  DOB: 09/05/1946    Initial Comment Caller states, his father in law - is waiting for his dr. to call- left arm is hurting, and bp concerns. Numbness in left arm. Verified    Nurse Assessment  Nurse: Orvan Seen, RN, Jacquilin Date/Time (Eastern Time): 11/26/2016 10:01:03 AM  Confirm and document reason for call. If symptomatic, describe symptoms. ---Caller states his blood pressure is increased. Patient denies any numbness in the arm. Patient states he has a headache that comes and goes. No headache currently. No chest pain. No SOB. Patient states his blood pressure was 212/190 but rechecked and it was 186/123. Verified multiple times that the patient has NO SYMPTOMS at this time.  Does the patient have any new or worsening symptoms? ---Yes  Will a triage be completed? ---Yes  Related visit to physician within the last 2 weeks? ---Yes  Does the PT have any chronic conditions? (i.e. diabetes, asthma, etc.) ---Yes  List chronic conditions. ---htn  Is this a behavioral health or substance abuse call? ---No     Guidelines    Guideline Title Affirmed Question Affirmed Notes  High Blood Pressure [1] BP ? 200/120 AND [2] having NO cardiac or neurologic symptoms    Final Disposition User   See Physician within 4 Hours (or PCP triage) Orvan Seen, RN, Jacquilin    Comments  Advised Caller i needed to talk with the patient he said to call [phone]  Checked with PCP no appointments open. Appointment made with [facility] [place] PCP OFFICE  REFERRED TO PCP OFFICE   Disagree/Comply: Comply

## 2016-11-26 NOTE — Progress Notes (Signed)
Subjective:    Patient ID: Terry Russo, male    DOB: 07-07-1946, 71 y.o.   MRN: [id number]  HPI 71 yo pt of Webb Silversmith NP here for HTN   He is a former smoker with hx of TIA  BP Readings from Last 3 Encounters:  11/26/16 (!) 156/98  10/08/16 149/95  07/04/16 140/86   Takes amlodipine 10 mg and lisinopril hct 20-25 mg daily   Per pt bp was ok until last week  His wife's visiting nurse started checking his bp also and it has been up  Taking medicine regularly - but was just taking one pill   Unsure which pill he is taking and which one he forgot   bp this am 212/90 this am   When bp is high he feels a mild headache   Saw eye doctor last wk and bp was up also   Not exercising as much with bad weather but usually bikes Watching salt in diet    Pulse Readings from Last 3 Encounters:  11/26/16 (!) 50  10/08/16 62  07/04/16 68     Wt Readings from Last 3 Encounters:  11/26/16 194 lb 8 oz (88.2 kg)  07/04/16 193 lb (87.5 kg)  06/20/16 192 lb 12 oz (87.4 kg)   bmi is 27.9    Chemistry      Component Value Date/Time   NA 145 06/03/2016 1614   K 4.4 06/03/2016 1614   CL 107 06/03/2016 1614   CO2 31 06/03/2016 1614   BUN 19 06/03/2016 1614   CREATININE 1.22 06/03/2016 1614   CREATININE 1.07 09/17/2014 1102      Component Value Date/Time   CALCIUM 9.7 06/03/2016 1614   ALKPHOS 75 06/03/2016 1614   AST 16 06/03/2016 1614   ALT 20 06/03/2016 1614   BILITOT 0.6 06/03/2016 1614     Lab Results  Component Value Date   TSH 0.76 11/01/2013    Lab Results  Component Value Date   WBC 5.7 06/03/2016   HGB 14.1 06/03/2016   HCT 40.6 06/03/2016   MCV 94.0 06/03/2016   PLT 180.0 06/03/2016    Hx of hyperglycemia  Lab Results  Component Value Date   HGBA1C 5.9 06/03/2016   Patient Active Problem List   Diagnosis Date Noted  . TIA (transient ischemic attack) 06/03/2016  . Diverticulosis of colon (without mention of hemorrhage) 10/31/2011  . HTN  (hypertension) 10/18/2011   Past Medical History:  Diagnosis Date  . Chicken pox   . Hypertension   . Right eye injury    pt has glass eye   Past Surgical History:  Procedure Laterality Date  . ABDOMINAL SURGERY    . AMPUTATION FINGER / THUMB     left  . COLONOSCOPY  10/31/2011   Procedure: COLONOSCOPY;  Surgeon: Owens Loffler, MD;  Location: [facility];  Service: Endoscopy;  Laterality: N/A;  . ENUCLEATION     glass eye  . HERNIA REPAIR     Social History  Substance Use Topics  . Smoking status: Former Smoker    Packs/day: 1.50    Years: 20.00    Types: Cigarettes    Quit date: 10/11/2003  . Smokeless tobacco: Never Used  . Alcohol use 0.6 oz/week    1 Cans of beer per week     Comment: half to a whole beer - occasionally (social)   Family History  Problem Relation Age of Onset  . Hypertension Sister   . Hypertension  Sister   . Diabetes Brother   . Cancer Neg Hx   . Heart disease Neg Hx   . Stroke Neg Hx    No Known Allergies Current Outpatient Prescriptions on File Prior to Visit  Medication Sig Dispense Refill  . amLODipine (NORVASC) 10 MG tablet Take 1 tablet (10 mg total) by mouth daily. 90 tablet 3  . aspirin 81 MG tablet Take 81 mg by mouth daily.    Marland Kitchen lisinopril-hydrochlorothiazide (PRINZIDE,ZESTORETIC) 20-25 MG tablet Take 1 tablet by mouth daily. 90 tablet 3  . Multiple Vitamin (DAILY-VITAMIN) TABS Take 1 tablet by mouth daily.      No current facility-administered medications on file prior to visit.     Review of Systems Review of Systems  Constitutional: Negative for fever, appetite change, fatigue and unexpected weight change.  Eyes: Negative for pain and visual disturbance.  Respiratory: Negative for cough and shortness of breath.   Cardiovascular: Negative for cp or palpitations    Gastrointestinal: Negative for nausea, diarrhea and constipation.  Genitourinary: Negative for urgency and frequency.  Skin: Negative for pallor or rash     Neurological: Negative for weakness, light-headedness, numbness and pos for mild headaches.  Hematological: Negative for adenopathy. Does not bruise/bleed easily.  Psychiatric/Behavioral: Negative for dysphoric mood. The patient is not nervous/anxious.  pos for stressors/caring for his wife            Objective:   Physical Exam  Constitutional: He appears well-developed and well-nourished. No distress.  Well appearing   HENT:  Head: Normocephalic and atraumatic.  Mouth/Throat: Oropharynx is clear and moist.  Eyes: Conjunctivae and EOM are normal. Pupils are equal, round, and reactive to light.  Neck: Normal range of motion. Neck supple. No JVD present. Carotid bruit is not present. No thyromegaly present.  Cardiovascular: Regular rhythm, normal heart sounds and intact distal pulses.  Exam reveals no gallop.   Pulse in mid 50s  Pulmonary/Chest: Effort normal and breath sounds normal. No respiratory distress. He has no wheezes. He has no rales.  No crackles  Abdominal: Soft. Bowel sounds are normal. He exhibits no distension and no abdominal bruit.  Musculoskeletal: He exhibits no edema.  Lymphadenopathy:    He has no cervical adenopathy.  Neurological: He is alert. He has normal reflexes.  Skin: Skin is warm and dry. No rash noted.  Psychiatric: He has a normal mood and affect.  Pleasant and talkative          Assessment & Plan:   Problem List Items Addressed This Visit      Cardiovascular and Mediastinum   HTN (hypertension) - Primary    Blood pressure is likely elevated since pt has only been taking one of his two bp medications  He will go home and organize pill box making sure he has both the amlodipine and lisinopril hct - and take whichever one he did not take this am today  Continue to watch bp f/u with pcp in approx 2 wk  Disc imp of physical activity and low sodium diet as well  Update if no improvement in readings in the meantime

## 2016-12-05 IMAGING — CT CT HEAD W/O CM
1 series · 16 of 30 positions shown, 20 images · non-contrast
Comparison: None.

CLINICAL DATA: Monoplegia of the left leg.  No history of stroke.

EXAM:
CT HEAD WITHOUT CONTRAST
TECHNIQUE: Contiguous axial images were obtained from the base of the skull
through the vertex without intravenous contrast.

[Series 2: headseq 4.8 h45s · axial · 0.43mm/px · z∈[-150,+2]mm · 16 of 36 slices shown, 20 images]
[im 2/36  brain]
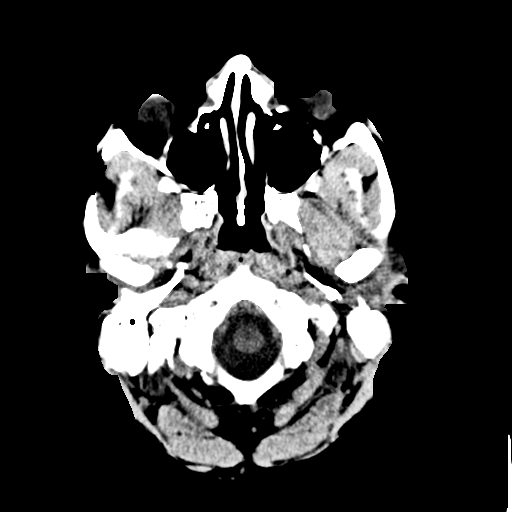
[im 2/36  bone]
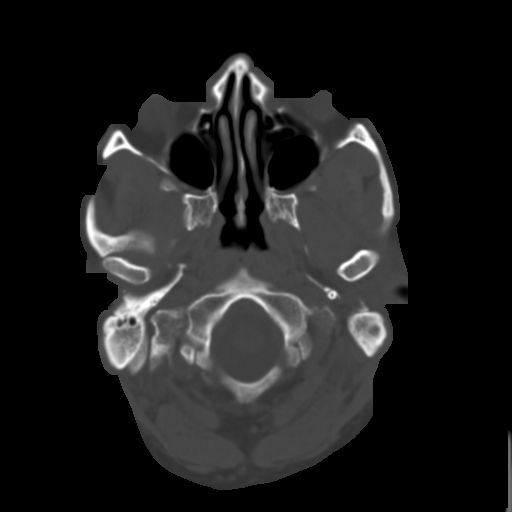
[im 4/36  brain]
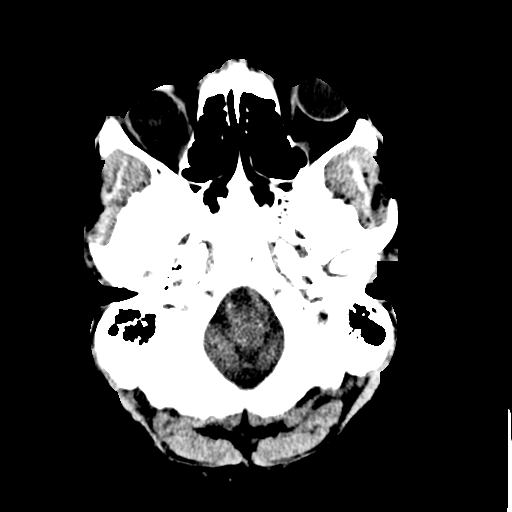
[im 7/36  brain]
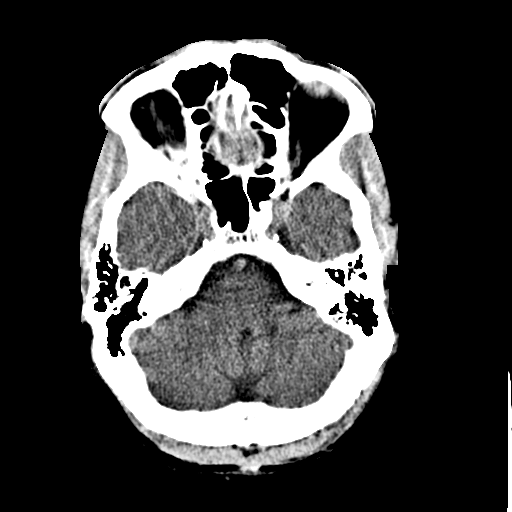
[im 9/36  brain]
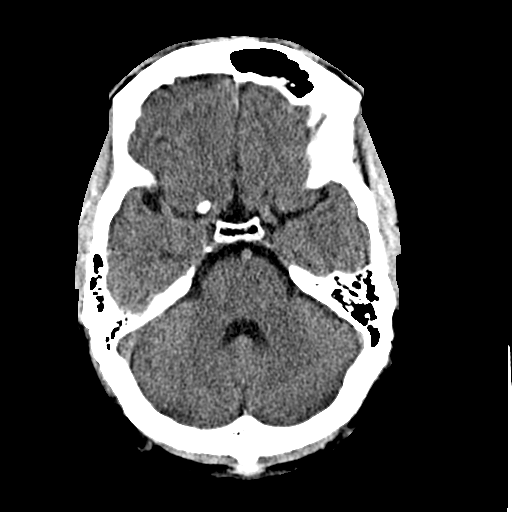
[im 10/36  brain]
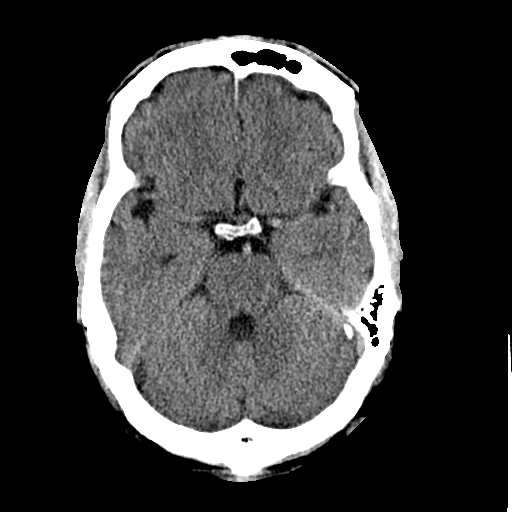
[im 10/36  bone]
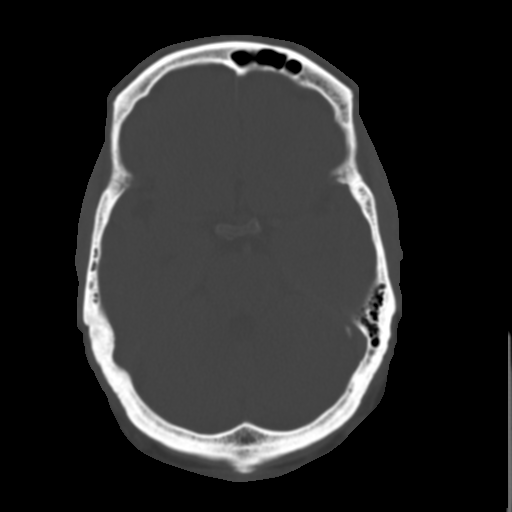
[im 13/36  brain]
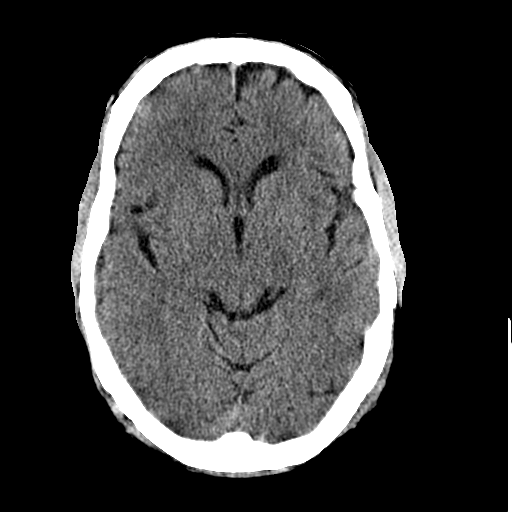
[im 15/36  brain]
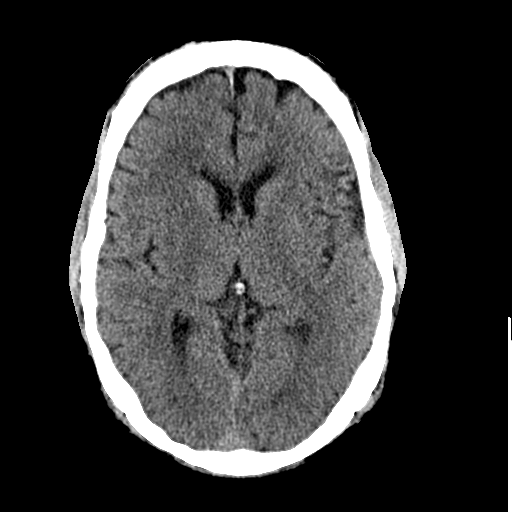
[im 17/36  brain]
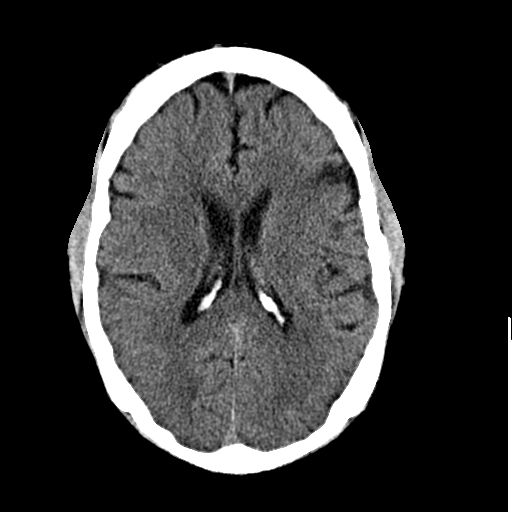
[im 19/36  brain]
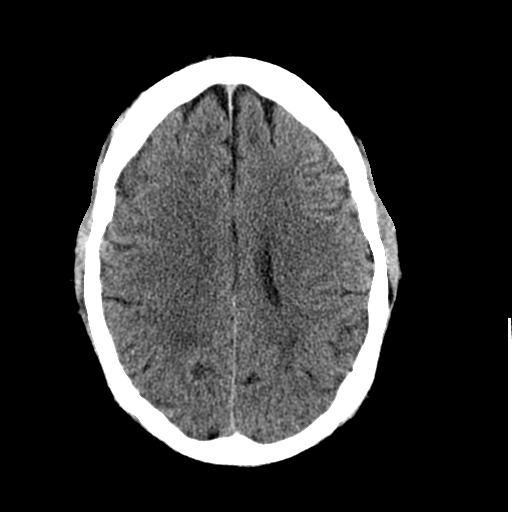
[im 19/36  bone]
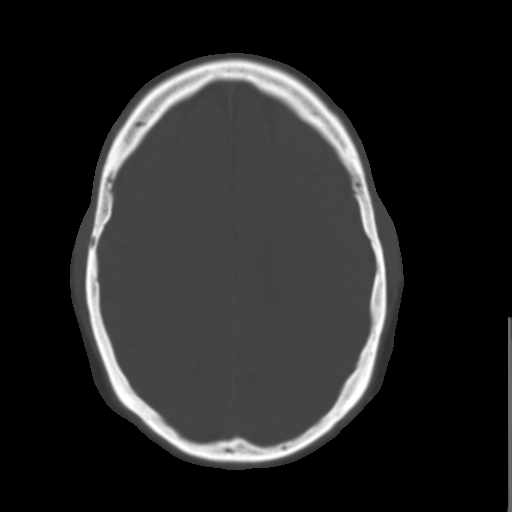
[im 21/36  brain]
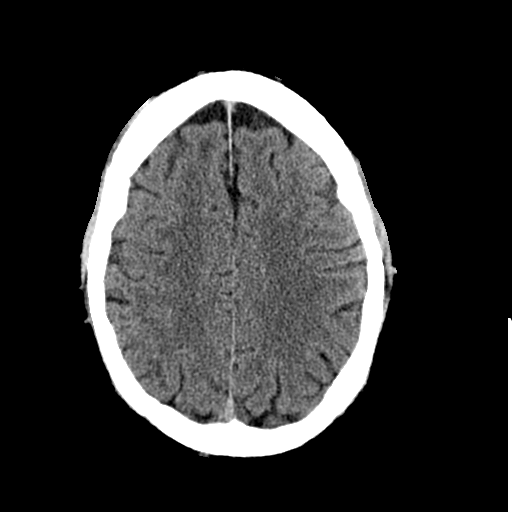
[im 23/36  brain]
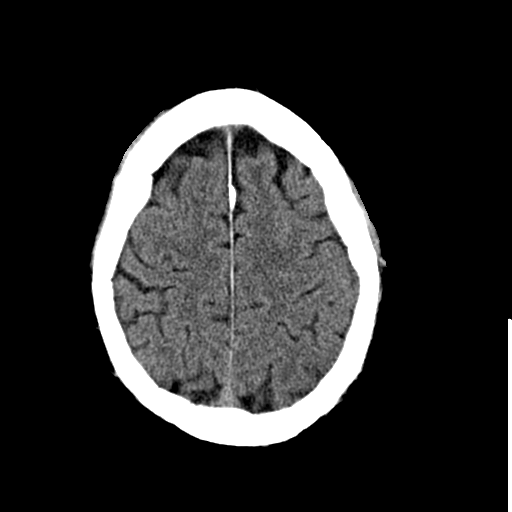
[im 26/36  brain]
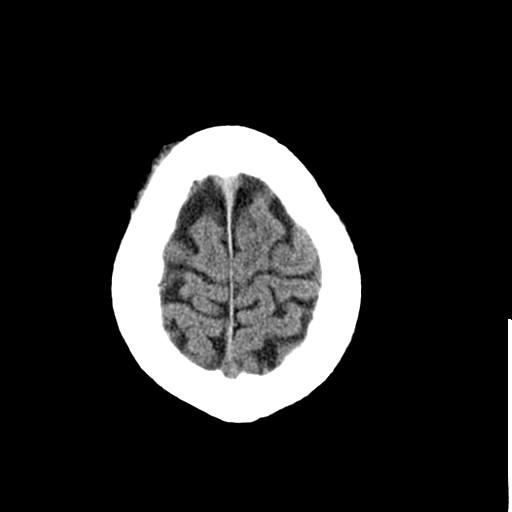
[im 27/36  brain]
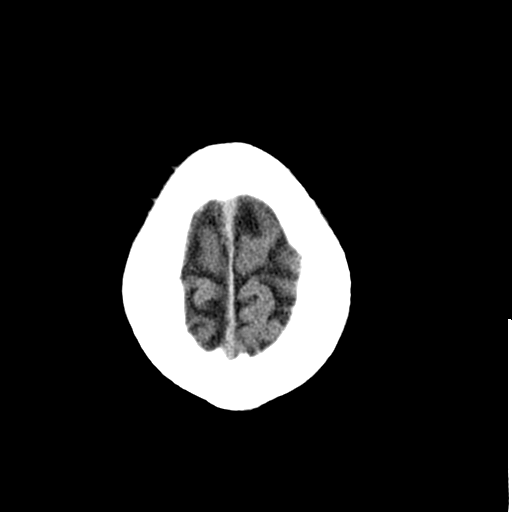
[im 27/36  bone]
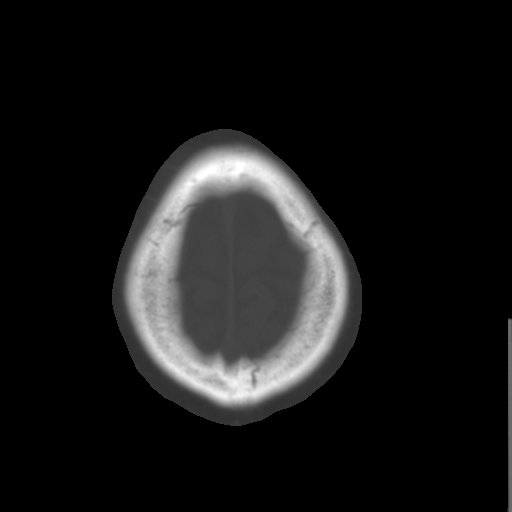
[im 29/36  brain]
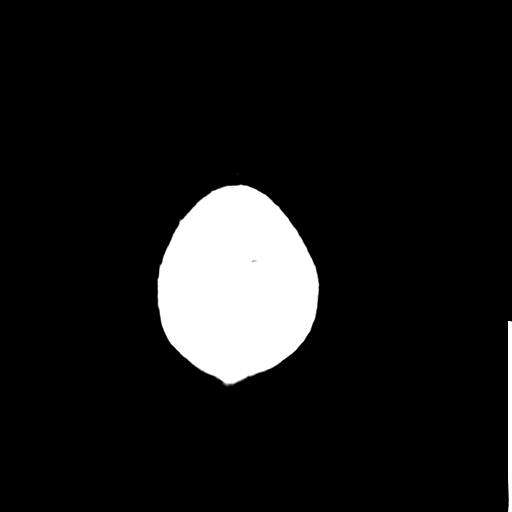
[im 32/36  brain]
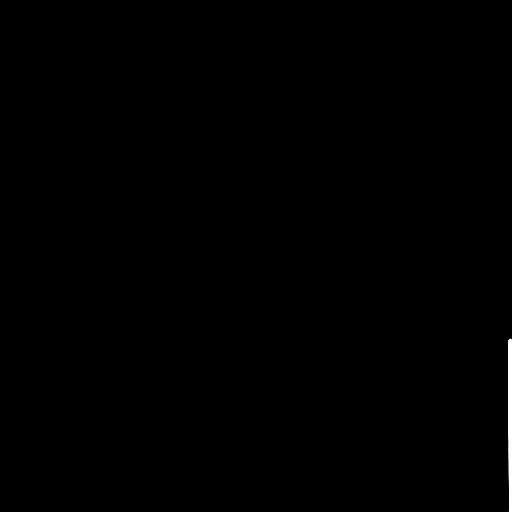
[im 34/36  brain]
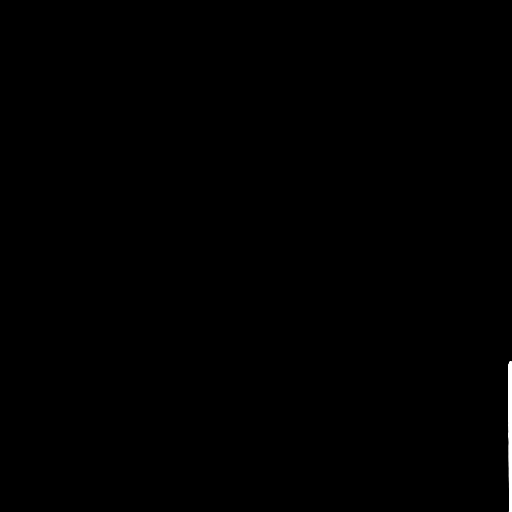

[16 of 30 positions shown; findings below may reference images not displayed]

FINDINGS: Ventricles and sulci are appropriate for patient age. Focal area of
low attenuation within the inferior lateral left frontal lobe (image
16; series 2). No evidence for acute intracranial hemorrhage or
significant mass effect. Paranasal sinuses unremarkable. Calvarium
is intact. Mastoid air cells are unremarkable. Postsurgical changes
right globe.
IMPRESSION: Focal hypoattenuation within the inferior left frontal lobe may
represent age indeterminate infarct, potentially subacute or
chronic.

No evidence for acute intracranial hemorrhage.

## 2016-12-16 ENCOUNTER — Ambulatory Visit: Payer: Medicare HMO | Admitting: Internal Medicine

## 2017-07-07 ENCOUNTER — Encounter: Payer: Commercial Managed Care - HMO | Admitting: Internal Medicine

## 2017-10-27 ENCOUNTER — Encounter: Payer: Self-pay | Admitting: Family Medicine

## 2017-10-27 ENCOUNTER — Ambulatory Visit (INDEPENDENT_AMBULATORY_CARE_PROVIDER_SITE_OTHER): Payer: Medicare HMO | Admitting: Family Medicine

## 2017-10-27 ENCOUNTER — Other Ambulatory Visit: Payer: Self-pay

## 2017-10-27 ENCOUNTER — Telehealth: Payer: Self-pay | Admitting: Internal Medicine

## 2017-10-27 ENCOUNTER — Telehealth: Payer: Self-pay

## 2017-10-27 VITALS — BP 200/120 | HR 51 | Temp 98.3°F | Ht 70.0 in | Wt 186.8 lb

## 2017-10-27 DIAGNOSIS — R079 Chest pain, unspecified: Secondary | ICD-10-CM

## 2017-10-27 DIAGNOSIS — I10 Essential (primary) hypertension: Secondary | ICD-10-CM

## 2017-10-27 LAB — BASIC METABOLIC PANEL
BUN / CREAT RATIO: 12 (calc) (ref 6–22)
BUN: 17 mg/dL (ref 7–25)
CALCIUM: 10.4 mg/dL — AB (ref 8.6–10.3)
CO2: 32 mmol/L (ref 20–32)
Chloride: 106 mmol/L (ref 98–110)
Creat: 1.4 mg/dL — ABNORMAL HIGH (ref 0.70–1.25)
Glucose, Bld: 98 mg/dL (ref 65–99)
Potassium: 4.3 mmol/L (ref 3.5–5.3)
SODIUM: 143 mmol/L (ref 135–146)

## 2017-10-27 LAB — CBC WITH DIFFERENTIAL/PLATELET
BASOS ABS: 30 {cells}/uL (ref 0–200)
Basophils Relative: 0.6 %
EOS ABS: 60 {cells}/uL (ref 15–500)
EOS PCT: 1.2 %
HEMATOCRIT: 41.3 % (ref 38.5–50.0)
HEMOGLOBIN: 14.1 g/dL (ref 13.2–17.1)
LYMPHS ABS: 1960 {cells}/uL (ref 850–3900)
MCH: 32.1 pg (ref 27.0–33.0)
MCHC: 34.1 g/dL (ref 32.0–36.0)
MCV: 94.1 fL (ref 80.0–100.0)
MPV: 12.4 fL (ref 7.5–12.5)
Monocytes Relative: 14.7 %
NEUTROS PCT: 44.3 %
Neutro Abs: 2215 cells/uL (ref 1500–7800)
Platelets: 163 10*3/uL (ref 140–400)
RBC: 4.39 10*6/uL (ref 4.20–5.80)
RDW: 12 % (ref 11.0–15.0)
Total Lymphocyte: 39.2 %
WBC mixed population: 735 cells/uL (ref 200–950)
WBC: 5 10*3/uL (ref 3.8–10.8)

## 2017-10-27 LAB — HEPATIC FUNCTION PANEL
AG RATIO: 1.6 (calc) (ref 1.0–2.5)
ALKALINE PHOSPHATASE (APISO): 66 U/L (ref 40–115)
ALT: 17 U/L (ref 9–46)
AST: 16 U/L (ref 10–35)
Albumin: 4.5 g/dL (ref 3.6–5.1)
BILIRUBIN DIRECT: 0.2 mg/dL (ref 0.0–0.2)
BILIRUBIN TOTAL: 0.8 mg/dL (ref 0.2–1.2)
Globulin: 2.8 g/dL (calc) (ref 1.9–3.7)
Indirect Bilirubin: 0.6 mg/dL (calc) (ref 0.2–1.2)
Total Protein: 7.3 g/dL (ref 6.1–8.1)

## 2017-10-27 LAB — TROPONIN I: Troponin I: 0.01 ng/mL (ref <=0.0)

## 2017-10-27 MED ORDER — LISINOPRIL-HYDROCHLOROTHIAZIDE 20-25 MG PO TABS: 1.0000 | ORAL_TABLET | Freq: Every day | ORAL | 3 refills | Status: DC

## 2017-10-27 MED ORDER — AMLODIPINE BESYLATE 10 MG PO TABS: 10.0000 mg | ORAL_TABLET | Freq: Every day | ORAL | 3 refills | Status: DC

## 2017-10-27 NOTE — Telephone Encounter (Signed)
Lorre Munroe, from [employer] calling with stat results: Creatinine 1.40 and Calcium 10.4.Results read back and confirmed with Tamira.   Dr. Nicki Reaper, on call provider called an made aware of stat results and also notified that stat CBC results came back normal. Results read back and confirmed. Dr. Nicki Reaper was given the pt's home and mobile number as well.

## 2017-10-27 NOTE — Telephone Encounter (Signed)
Called with stat labs.  Nurse called to report calcium 10.4 and creatinine 1.4.  Spoke to pt.  States he is feeling better.  No pain.  Discussed lab results.  Denies need for evaluation tonight.  States is eating and staying hydrated.  No chest pain.  Back on his blood pressure medication.  Resting.  Informed if any symptom change or problems, he is to be evaluated.  Pt agrees.

## 2017-10-27 NOTE — Progress Notes (Signed)
Dr. Frederico Hamman T. Nafeesa Dils, MD, [facility] [place], [address] Phone: [phone] Fax: [phone]  10/27/2017  Patient: Terry Russo, MRN: [id number], DOB: 02-Oct-1945, 72 y.o.  Primary Physician:  Jearld Fenton, NP   Chief Complaint  Patient presents with  . Chest Pain    Right Side-radiates down to bottom of abdomen   Subjective:   Terry Russo is a 72 y.o. very pleasant male patient who presents with the following:  Patient presents with several acute issues today in the office.  He woke up and early this morning he had some right-sided chest pain that went down also into the abdomen on the right side about 3:00 in the morning or so, but it had subsided within a few hours.  He went to the bathroom and also took some Tums.  He has no history of cardiac disease, but he does have a history of TIA in the past.  Significantly, he has not been taking his blood pressure medication for the last 2 months and his blood pressure is 200/120 today in the office.  Pain on the R side of chest, rubbing and using the bathroom.  Went away at 6:30 AM.   Had a knot   Past Medical History, Surgical History, Social History, Family History, Problem List, Medications, and Allergies have been reviewed and updated if relevant.  Patient Active Problem List   Diagnosis Date Noted  . TIA (transient ischemic attack) 06/03/2016  . Diverticulosis of colon (without mention of hemorrhage) 10/31/2011  . HTN (hypertension) 10/18/2011    Past Medical History:  Diagnosis Date  . Chicken pox   . Hypertension   . Right eye injury    pt has glass eye    Past Surgical History:  Procedure Laterality Date  . ABDOMINAL SURGERY    . AMPUTATION FINGER / THUMB     left  . COLONOSCOPY  10/31/2011   Procedure: COLONOSCOPY;  Surgeon: Owens Loffler, MD;  Location: [facility];  Service: Endoscopy;  Laterality: N/A;  . ENUCLEATION     glass eye    . HERNIA REPAIR      Social History   Socioeconomic History  . Marital status: Married    Spouse name: Bonnita Nasuti  . Number of children: 7  . Years of education: Not on file  . Highest education level: Not on file  Social Needs  . Financial resource strain: Not on file  . Food insecurity - worry: Not on file  . Food insecurity - inability: Not on file  . Transportation needs - medical: Not on file  . Transportation needs - non-medical: Not on file  Occupational History  . Occupation: welder---retired  Tobacco Use  . Smoking status: Former Smoker    Packs/day: 1.50    Years: 20.00    Pack years: 30.00    Types: Cigarettes    Last attempt to quit: 10/11/2003    Years since quitting: 14.0  . Smokeless tobacco: Never Used  Substance and Sexual Activity  . Alcohol use: Yes    Alcohol/week: 0.6 oz    Types: 1 Cans of beer per week    Comment: half to a whole beer - occasionally (social)  . Drug use: No  . Sexual activity: Not on file  Other Topics Concern  . Not on file  Social History Narrative   Lives with wife at home    Family History  Problem Relation Age  of Onset  . Hypertension Sister   . Hypertension Sister   . Diabetes Brother   . Cancer Neg Hx   . Heart disease Neg Hx   . Stroke Neg Hx     No Known Allergies  Medication list reviewed and updated in full in [place].   GEN: No acute illnesses, no fevers, chills. GI: No n/v/d, eating normally Pulm: No SOB Interactive and getting along well at home.  Otherwise, ROS is as per the HPI.  Objective:   BP (!) 200/120   Pulse (!) 51   Temp 98.3 F (36.8 C) (Oral)   Ht 5\' 10"  (1.778 m)   Wt 186 lb 12 oz (84.7 kg)   BMI 26.80 kg/m   GEN: WDWN, NAD, Non-toxic, A & O x 3 HEENT: Atraumatic, Normocephalic. Neck supple. No masses, No LAD. Ears and Nose: No external deformity. CV: RRR, No M/G/R. No JVD. No thrill. No extra heart sounds. No chest wall pain on exam. PULM: CTA B, no wheezes, crackles,  rhonchi. No retractions. No resp. distress. No accessory muscle use. EXTR: No c/c/e NEURO Normal gait.  PSYCH: Normally interactive. Conversant. Not depressed or anxious appearing.  Calm demeanor.   Laboratory and Imaging Data: Results for orders placed or performed in visit on 81/01/75  Basic metabolic panel  Result Value Ref Range   Glucose, Bld 98 65 - 99 mg/dL   BUN 17 7 - 25 mg/dL   Creat 1.40 (H) 0.70 - 1.25 mg/dL   BUN/Creatinine Ratio 12 6 - 22 (calc)   Sodium 143 135 - 146 mmol/L   Potassium 4.3 3.5 - 5.3 mmol/L   Chloride 106 98 - 110 mmol/L   CO2 32 20 - 32 mmol/L   Calcium 10.4 (H) 8.6 - 10.3 mg/dL  CBC with Differential/Platelet  Result Value Ref Range   WBC 5.0 3.8 - 10.8 Thousand/uL   RBC 4.39 4.20 - 5.80 Million/uL   Hemoglobin 14.1 13.2 - 17.1 g/dL   HCT 41.3 38.5 - 50.0 %   MCV 94.1 80.0 - 100.0 fL   MCH 32.1 27.0 - 33.0 pg   MCHC 34.1 32.0 - 36.0 g/dL   RDW 12.0 11.0 - 15.0 %   Platelets 163 140 - 400 Thousand/uL   MPV 12.4 7.5 - 12.5 fL   Neutro Abs 2,215 1,500 - 7,800 cells/uL   Lymphs Abs 1,960 850 - 3,900 cells/uL   WBC mixed population 735 200 - 950 cells/uL   Eosinophils Absolute 60 15 - 500 cells/uL   Basophils Absolute 30 0 - 200 cells/uL   Neutrophils Relative % 44.3 %   Total Lymphocyte 39.2 %   Monocytes Relative 14.7 %   Eosinophils Relative 1.2 %   Basophils Relative 0.6 %  Hepatic function panel  Result Value Ref Range   Total Protein 7.3 6.1 - 8.1 g/dL   Albumin 4.5 3.6 - 5.1 g/dL   Globulin 2.8 1.9 - 3.7 g/dL (calc)   AG Ratio 1.6 1.0 - 2.5 (calc)   Total Bilirubin 0.8 0.2 - 1.2 mg/dL   Bilirubin, Direct 0.2 0.0 - 0.2 mg/dL   Indirect Bilirubin 0.6 0.2 - 1.2 mg/dL (calc)   Alkaline phosphatase (APISO) 66 40 - 115 U/L   AST 16 10 - 35 U/L   ALT 17 9 - 46 U/L  Troponin I  Result Value Ref Range   Troponin I 0.01 < OR = 0.0 ng/mL     Assessment and Plan:   Chest  pain at rest - Plan: Basic metabolic panel, CBC with  Differential/Platelet, Hepatic function panel, Troponin I, EKG 12-Lead  Essential hypertension - Plan: lisinopril-hydrochlorothiazide (PRINZIDE,ZESTORETIC) 20-25 MG tablet, Basic metabolic panel  EKG: Normal sinus rhythm. Normal axis, normal R wave progression, No acute ST elevation or depression. There is j-pole elevation at v2 and v3 which is unchanged from most recent EKG.  Unclear exactly what his pain was, it is not reproducible musculoskeletal pain.  He did take some Tums and had relief of symptoms, so GI origin is certainly possible.  Troponin to greater than 12 hours out is negative.  EKG reassuring.  Restart blood pressure medication.  Routine regular follow-up with primary care doctor. Slight increase in Cr - push fluids the next few days.  Follow-up: No Follow-up on file.  Meds ordered this encounter  Medications  . amLODipine (NORVASC) 10 MG tablet    Sig: Take 1 tablet (10 mg total) by mouth daily.    Dispense:  90 tablet    Refill:  3  . lisinopril-hydrochlorothiazide (PRINZIDE,ZESTORETIC) 20-25 MG tablet    Sig: Take 1 tablet by mouth daily.    Dispense:  90 tablet    Refill:  3   Medications Discontinued During This Encounter  Medication Reason  . amLODipine (NORVASC) 10 MG tablet Reorder  . lisinopril-hydrochlorothiazide (PRINZIDE,ZESTORETIC) 20-25 MG tablet Reorder   Orders Placed This Encounter  Procedures  . Basic metabolic panel  . CBC with Differential/Platelet  . Hepatic function panel  . Troponin I  . EKG 12-Lead    Signed,  Leanard Dimaio T. Percilla Tweten, MD   Allergies as of 10/27/2017   No Known Allergies     Medication List        Accurate as of 10/27/17 11:59 PM. Always use your most recent med list.          amLODipine 10 MG tablet Commonly known as:  NORVASC Take 1 tablet (10 mg total) by mouth daily.   aspirin 81 MG tablet Take 81 mg by mouth daily.   DAILY-VITAMIN Tabs Take 1 tablet by mouth daily.   lisinopril-hydrochlorothiazide  20-25 MG tablet Commonly known as:  PRINZIDE,ZESTORETIC Take 1 tablet by mouth daily.

## 2017-10-27 NOTE — Telephone Encounter (Signed)
I spoke with Terry Russo; Terry Russo started with rt sided sharp CP that radiated to lower abd on and off starting 10/27/17 at 3:00 am - 5:45 AM. Terry Russo has never had any pain like this before.  Now no CP but on and off rt shoulder pain that radiates to rt lower abd. No pain at this time. No N&V, sweats, SOB diarrhea or constipation. Terry Russo is in no distress and is presently at work. Terry Russo has appt 10/27/17 at 3:20 with Dr Lorelei Pont. I spoke with Avie Echevaria NP and Terry Russo can keep appt today at 3:20 pm unless has more CP and then should go to ED immediately. Terry Russo voiced understanding. FYI to Dr Lorelei Pont.

## 2017-10-27 NOTE — Telephone Encounter (Signed)
Terry Russo from [facility] calling to see if Stat results of CBC were available in chart. Results confirmed in chart. Results will also be faxed.

## 2017-10-28 NOTE — Telephone Encounter (Signed)
Dr Lorelei Pont has addressed on lab result note.

## 2017-10-28 NOTE — Telephone Encounter (Signed)
Appreciated. I was looking for this and called him myself last night, as well.

## 2017-11-03 ENCOUNTER — Ambulatory Visit: Payer: Self-pay | Admitting: *Deleted

## 2017-11-03 NOTE — Telephone Encounter (Signed)
Patient phoned in with high blood pressure readings from Friday and today. Today's readings 190/75 and 170/90. He denies having any symptoms of high b/p at this time.  Reason for Disposition . Systolic BP  >= 528 OR Diastolic >= 413  Answer Assessment - Initial Assessment Questions 1. BLOOD PRESSURE: "What is the blood pressure?" "Did you take at least two measurements 5 minutes apart?"  190/75 one time today at work 2. ONSET: "When did you take your blood pressure?" 9:30 3. HOW: "How did you obtain the blood pressure?" (e.g., visiting nurse, automatic home BP monitor)   Automatic machine at work 4. HISTORY: "Do you have a history of high blood pressure?"   yes 5. MEDICATIONS: "Are you taking any medications for blood pressure?" "Have you missed any doses recently?"     Yes taking as prescribed. 6. OTHER SYMPTOMS: "Do you have any symptoms?" (e.g., headache, chest pain, blurred vision, difficulty breathing, weakness)  headaches sometimes. Last week he had a pain that radiated down his right side as the day went on it went away. 7. PREGNANCY: "Is there any chance you are pregnant?" "When was your last menstrual period?"  na  Protocols used: HIGH BLOOD PRESSURE-A-AH

## 2017-11-04 ENCOUNTER — Encounter: Payer: Self-pay | Admitting: Internal Medicine

## 2017-11-04 ENCOUNTER — Ambulatory Visit (INDEPENDENT_AMBULATORY_CARE_PROVIDER_SITE_OTHER): Payer: Medicare HMO | Admitting: Internal Medicine

## 2017-11-04 DIAGNOSIS — I10 Essential (primary) hypertension: Secondary | ICD-10-CM

## 2017-11-04 LAB — BASIC METABOLIC PANEL
BUN: 16 mg/dL (ref 6–23)
CALCIUM: 9.9 mg/dL (ref 8.4–10.5)
CO2: 30 mEq/L (ref 19–32)
Chloride: 103 mEq/L (ref 96–112)
Creatinine, Ser: 1.1 mg/dL (ref 0.40–1.50)
GFR: 85.16 mL/min (ref >60.00)
Glucose, Bld: 90 mg/dL (ref 70–99)
Potassium: 4 mEq/L (ref 3.5–5.1)
SODIUM: 139 meq/L (ref 135–145)

## 2017-11-04 MED ORDER — LISINOPRIL-HYDROCHLOROTHIAZIDE 20-25 MG PO TABS: 2.0000 | ORAL_TABLET | Freq: Every day | ORAL | 3 refills | Status: DC

## 2017-11-04 NOTE — Progress Notes (Signed)
Subjective:    Patient ID: Terry Russo, male    DOB: December 09, 1945, 72 y.o.   MRN: [id number]  HPI  Pt presents to the clinic today with c/o elevated blood pressure. He reports his blood pressures have been running 190/75, 170/90. He is taking Amlodipine and Lisinopril HCT as prescribed. He denies adverse effects. His BP today is 142/82. ECG from 10/2017 reviewed.  Review of Systems      Past Medical History:  Diagnosis Date  . Chicken pox   . Hypertension   . Right eye injury    pt has glass eye    Current Outpatient Medications  Medication Sig Dispense Refill  . amLODipine (NORVASC) 10 MG tablet Take 1 tablet (10 mg total) by mouth daily. 90 tablet 3  . aspirin 81 MG tablet Take 81 mg by mouth daily.    Marland Kitchen lisinopril-hydrochlorothiazide (PRINZIDE,ZESTORETIC) 20-25 MG tablet Take 1 tablet by mouth daily. 90 tablet 3  . Multiple Vitamin (DAILY-VITAMIN) TABS Take 1 tablet by mouth daily.      No current facility-administered medications for this visit.     No Known Allergies  Family History  Problem Relation Age of Onset  . Hypertension Sister   . Hypertension Sister   . Diabetes Brother   . Cancer Neg Hx   . Heart disease Neg Hx   . Stroke Neg Hx     Social History   Socioeconomic History  . Marital status: Married    Spouse name: Bonnita Nasuti  . Number of children: 7  . Years of education: Not on file  . Highest education level: Not on file  Social Needs  . Financial resource strain: Not on file  . Food insecurity - worry: Not on file  . Food insecurity - inability: Not on file  . Transportation needs - medical: Not on file  . Transportation needs - non-medical: Not on file  Occupational History  . Occupation: welder---retired  Tobacco Use  . Smoking status: Former Smoker    Packs/day: 1.50    Years: 20.00    Pack years: 30.00    Types: Cigarettes    Last attempt to quit: 10/11/2003    Years since quitting: 14.0  . Smokeless tobacco: Never Used  Substance  and Sexual Activity  . Alcohol use: Yes    Alcohol/week: 0.6 oz    Types: 1 Cans of beer per week    Comment: half to a whole beer - occasionally (social)  . Drug use: No  . Sexual activity: Not on file  Other Topics Concern  . Not on file  Social History Narrative   Lives with wife at home     Constitutional: Denies fever, malaise, fatigue, headache or abrupt weight changes.  Respiratory: Denies difficulty breathing, shortness of breath, cough or sputum production.   Cardiovascular: Denies chest pain, chest tightness, palpitations or swelling in the hands or feet.  Neurological: Denies dizziness, difficulty with memory, difficulty with speech or problems with balance and coordination.    No other specific complaints in a complete review of systems (except as listed in HPI above).  Objective:   Physical Exam  BP (!) 142/82   Pulse (!) 53   Temp 98.2 F (36.8 C)   Wt 188 lb (85.3 kg)   SpO2 97%   BMI 26.98 kg/m  Wt Readings from Last 3 Encounters:  11/04/17 188 lb (85.3 kg)  10/27/17 186 lb 12 oz (84.7 kg)  11/26/16 194 lb 8 oz (88.2 kg)  General: Appears his stated age, in NAD. Cardiovascular: Bradycardic with normal rhythm. S1,S2 noted.  No murmur, rubs or gallops noted. No JVD or BLE edema.  Pulmonary/Chest: Normal effort and positive vesicular breath sounds. No respiratory distress. No wheezes, rales or ronchi noted.  Neurological: Alert and oriented.  :  BMET    Component Value Date/Time   NA 143 10/27/2017 1620   K 4.3 10/27/2017 1620   CL 106 10/27/2017 1620   CO2 32 10/27/2017 1620   GLUCOSE 98 10/27/2017 1620   BUN 17 10/27/2017 1620   CREATININE 1.40 (H) 10/27/2017 1620   CALCIUM 10.4 (H) 10/27/2017 1620   GFRNONAA >60 12/27/2015 1755   GFRAA >60 12/27/2015 1755    Lipid Panel     Component Value Date/Time   CHOL 167 06/03/2016 1614   TRIG 127.0 06/03/2016 1614   HDL 78.60 06/03/2016 1614   CHOLHDL 2 06/03/2016 1614   VLDL 25.4 06/03/2016  1614   LDLCALC 63 06/03/2016 1614    CBC    Component Value Date/Time   WBC 5.0 10/27/2017 1620   RBC 4.39 10/27/2017 1620   HGB 14.1 10/27/2017 1620   HCT 41.3 10/27/2017 1620   PLT 163 10/27/2017 1620   MCV 94.1 10/27/2017 1620   MCV 95.1 09/17/2014 1110   MCH 32.1 10/27/2017 1620   MCHC 34.1 10/27/2017 1620   RDW 12.0 10/27/2017 1620   LYMPHSABS 1,960 10/27/2017 1620   MONOABS 0.5 12/27/2015 1755   EOSABS 60 10/27/2017 1620   BASOSABS 30 10/27/2017 1620    Hgb A1C Lab Results  Component Value Date   HGBA1C 5.9 06/03/2016            Assessment & Plan:

## 2017-11-04 NOTE — Assessment & Plan Note (Signed)
Better but still not at goal Continue Amlodipine Increase Lisinopril HCT to 40-25 mg daily, refilled today Reinforced DASH diet and exercise for weight loss BMET today  RTC in 3 weeks for follow up HTN

## 2017-11-04 NOTE — Patient Instructions (Signed)

## 2017-11-25 ENCOUNTER — Ambulatory Visit: Payer: Medicare HMO | Admitting: Internal Medicine

## 2017-11-25 DIAGNOSIS — Z0289 Encounter for other administrative examinations: Secondary | ICD-10-CM

## 2018-02-13 ENCOUNTER — Ambulatory Visit: Payer: Medicare HMO | Admitting: Primary Care

## 2018-02-16 ENCOUNTER — Ambulatory Visit (INDEPENDENT_AMBULATORY_CARE_PROVIDER_SITE_OTHER): Payer: Medicare HMO | Admitting: Internal Medicine

## 2018-02-16 ENCOUNTER — Ambulatory Visit (INDEPENDENT_AMBULATORY_CARE_PROVIDER_SITE_OTHER)
Admission: RE | Admit: 2018-02-16 | Discharge: 2018-02-16 | Disposition: A | Discharge status: Home / Self Care | Payer: Medicare HMO | Source: Ambulatory Visit | Attending: Internal Medicine | Admitting: Internal Medicine

## 2018-02-16 ENCOUNTER — Encounter: Payer: Self-pay | Admitting: *Deleted

## 2018-02-16 ENCOUNTER — Encounter: Payer: Self-pay | Admitting: Internal Medicine

## 2018-02-16 VITALS — BP 126/66 | HR 62 | Temp 98.2°F | Wt 179.5 lb

## 2018-02-16 DIAGNOSIS — M79604 Pain in right leg: Secondary | ICD-10-CM | POA: Diagnosis not present

## 2018-02-16 DIAGNOSIS — M7989 Other specified soft tissue disorders: Secondary | ICD-10-CM | POA: Diagnosis not present

## 2018-02-16 DIAGNOSIS — M79661 Pain in right lower leg: Secondary | ICD-10-CM | POA: Diagnosis not present

## 2018-02-16 DIAGNOSIS — S8991XA Unspecified injury of right lower leg, initial encounter: Secondary | ICD-10-CM | POA: Diagnosis not present

## 2018-02-16 NOTE — Progress Notes (Signed)
Subjective:    Patient ID: Terry Russo, male    DOB: 1945/10/10, 72 y.o.   MRN: [id number]  HPI  Pt presents to the clinic today with c/o right leg pain. He was removing steps, hit his right lower leg with a hammer. The area is swollen. He has been having some difficulty walking. He has tired heat with minimal relief. He has not tried any medications OTC.  Review of Systems      Past Medical History:  Diagnosis Date  . Chicken pox   . Hypertension   . Right eye injury    pt has glass eye    Current Outpatient Medications  Medication Sig Dispense Refill  . amLODipine (NORVASC) 10 MG tablet Take 1 tablet (10 mg total) by mouth daily. 90 tablet 3  . aspirin 81 MG tablet Take 81 mg by mouth daily.    Marland Kitchen lisinopril-hydrochlorothiazide (PRINZIDE,ZESTORETIC) 20-25 MG tablet Take 2 tablets by mouth daily. 180 tablet 3  . Multiple Vitamin (DAILY-VITAMIN) TABS Take 1 tablet by mouth daily.      No current facility-administered medications for this visit.     No Known Allergies  Family History  Problem Relation Age of Onset  . Hypertension Sister   . Hypertension Sister   . Diabetes Brother   . Cancer Neg Hx   . Heart disease Neg Hx   . Stroke Neg Hx     Social History   Socioeconomic History  . Marital status: Married    Spouse name: Bonnita Nasuti  . Number of children: 7  . Years of education: Not on file  . Highest education level: Not on file  Occupational History  . Occupation: welder---retired  Social Needs  . Financial resource strain: Not on file  . Food insecurity:    Worry: Not on file    Inability: Not on file  . Transportation needs:    Medical: Not on file    Non-medical: Not on file  Tobacco Use  . Smoking status: Former Smoker    Packs/day: 1.50    Years: 20.00    Pack years: 30.00    Types: Cigarettes    Last attempt to quit: 10/11/2003    Years since quitting: 14.3  . Smokeless tobacco: Never Used  Substance and Sexual Activity  . Alcohol use: Yes     Alcohol/week: 0.6 oz    Types: 1 Cans of beer per week    Comment: half to a whole beer - occasionally (social)  . Drug use: No  . Sexual activity: Not on file  Lifestyle  . Physical activity:    Days per week: Not on file    Minutes per session: Not on file  . Stress: Not on file  Relationships  . Social connections:    Talks on phone: Not on file    Gets together: Not on file    Attends religious service: Not on file    Active member of club or organization: Not on file    Attends meetings of clubs or organizations: Not on file    Relationship status: Not on file  . Intimate partner violence:    Fear of current or ex partner: Not on file    Emotionally abused: Not on file    Physically abused: Not on file    Forced sexual activity: Not on file  Other Topics Concern  . Not on file  Social History Narrative   Lives with wife at home  Constitutional: Denies fever, malaise, fatigue, headache or abrupt weight changes.  Musculoskeletal: Pt reports right leg pain, pain with ambulation. Denies decrease in range of motion, muscle pain or joint swelling.  Skin: Pt reports bruising swelling and bruising of RLE. Denies redness, rashes, lesions or ulcercations.   No other specific complaints in a complete review of systems (except as listed in HPI above).  Objective:   Physical Exam  BP 126/66   Pulse 62   Temp 98.2 F (36.8 C) (Oral)   Wt 179 lb 8 oz (81.4 kg)   SpO2 98%   BMI 25.76 kg/m  Wt Readings from Last 3 Encounters:  02/16/18 179 lb 8 oz (81.4 kg)  11/04/17 188 lb (85.3 kg)  10/27/17 186 lb 12 oz (84.7 kg)    General: Appears his stated age, well developed, well nourished in NAD. Skin: Warm, dry and intact. Bruising noted over right tibia.  Musculoskeletal: Pain with palpation over the right tibia. Mild swelling noted. No difficulty with gait.    BMET    Component Value Date/Time   NA 139 11/04/2017 0926   K 4.0 11/04/2017 0926   CL 103 11/04/2017  0926   CO2 30 11/04/2017 0926   GLUCOSE 90 11/04/2017 0926   BUN 16 11/04/2017 0926   CREATININE 1.10 11/04/2017 0926   CREATININE 1.40 (H) 10/27/2017 1620   CALCIUM 9.9 11/04/2017 0926   GFRNONAA >60 12/27/2015 1755   GFRAA >60 12/27/2015 1755    Lipid Panel     Component Value Date/Time   CHOL 167 06/03/2016 1614   TRIG 127.0 06/03/2016 1614   HDL 78.60 06/03/2016 1614   CHOLHDL 2 06/03/2016 1614   VLDL 25.4 06/03/2016 1614   LDLCALC 63 06/03/2016 1614    CBC    Component Value Date/Time   WBC 5.0 10/27/2017 1620   RBC 4.39 10/27/2017 1620   HGB 14.1 10/27/2017 1620   HCT 41.3 10/27/2017 1620   PLT 163 10/27/2017 1620   MCV 94.1 10/27/2017 1620   MCV 95.1 09/17/2014 1110   MCH 32.1 10/27/2017 1620   MCHC 34.1 10/27/2017 1620   RDW 12.0 10/27/2017 1620   LYMPHSABS 1,960 10/27/2017 1620   MONOABS 0.5 12/27/2015 1755   EOSABS 60 10/27/2017 1620   BASOSABS 30 10/27/2017 1620    Hgb A1C Lab Results  Component Value Date   HGBA1C 5.9 06/03/2016            Assessment & Plan:   Pain, Swelling of Right Lower Leg:  Xray right tib/fib today to r/o fracture Encouraged ice, elevation and NSAID's  Will follow up after xray, return precautions discussed Webb Silversmith, NP

## 2018-02-20 ENCOUNTER — Encounter: Payer: Self-pay | Admitting: Internal Medicine

## 2018-02-20 NOTE — Patient Instructions (Signed)
RICE for Routine Care of Injuries Many injuries can be cared for using rest, ice, compression, and elevation (RICE therapy). Using RICE therapy can help to lessen pain and swelling. It can help your body to heal. Rest Reduce your normal activities and avoid using the injured part of your body. You can go back to your normal activities when you feel okay and your doctor says it is okay. Ice Do not put ice on your bare skin.  Put ice in a plastic bag.  Place a towel between your skin and the bag.  Leave the ice on for 20 minutes, 2-3 times a day.  Do this for as long as told by your doctor. Compression Compression means putting pressure on the injured area. This can be done with an elastic bandage. If an elastic bandage has been applied:  Remove and reapply the bandage every 3-4 hours or as told by your doctor.  Make sure the bandage is not wrapped too tight. Wrap the bandage more loosely if part of your body beyond the bandage is blue, swollen, cold, painful, or loses feeling (numb).  See your doctor if the bandage seems to make your problems worse.  Elevation Elevation means keeping the injured area raised. Raise the injured area above your heart or the center of your chest if you can. When should I get help? You should get help if:  You keep having pain and swelling.  Your symptoms get worse.  Get help right away if: You should get help right away if:  You have sudden bad pain at or below the area of your injury.  You have redness or more swelling around your injury.  You have tingling or numbness at or below the injury that does not go away when you take off the bandage.  This information is not intended to replace advice given to you by your health care provider. Make sure you discuss any questions you have with your health care provider. Document Released: 02/12/2008 Document Revised: 07/23/2016 Document Reviewed: 08/03/2014 Elsevier Interactive Patient Education  2017  [employer]  

## 2018-06-10 ENCOUNTER — Encounter: Payer: Self-pay | Admitting: Family Medicine

## 2018-06-10 ENCOUNTER — Ambulatory Visit (INDEPENDENT_AMBULATORY_CARE_PROVIDER_SITE_OTHER): Payer: Medicare HMO | Admitting: Family Medicine

## 2018-06-10 ENCOUNTER — Telehealth: Payer: Self-pay

## 2018-06-10 ENCOUNTER — Encounter (INDEPENDENT_AMBULATORY_CARE_PROVIDER_SITE_OTHER): Payer: Self-pay

## 2018-06-10 VITALS — BP 120/80 | HR 52 | Temp 98.1°F | Ht 70.0 in | Wt 173.8 lb

## 2018-06-10 DIAGNOSIS — G459 Transient cerebral ischemic attack, unspecified: Secondary | ICD-10-CM | POA: Diagnosis not present

## 2018-06-10 DIAGNOSIS — I10 Essential (primary) hypertension: Secondary | ICD-10-CM | POA: Diagnosis not present

## 2018-06-10 DIAGNOSIS — R079 Chest pain, unspecified: Secondary | ICD-10-CM | POA: Diagnosis not present

## 2018-06-10 LAB — CBC WITH DIFFERENTIAL/PLATELET
BASOS PCT: 1 % (ref 0.0–3.0)
Basophils Absolute: 0 10*3/uL (ref 0.0–0.1)
Eosinophils Absolute: 0.1 10*3/uL (ref 0.0–0.7)
Eosinophils Relative: 2 % (ref 0.0–5.0)
HEMATOCRIT: 40.3 % (ref 39.0–52.0)
Hemoglobin: 13.9 g/dL (ref 13.0–17.0)
LYMPHS PCT: 51.2 % — AB (ref 12.0–46.0)
Lymphs Abs: 1.9 10*3/uL (ref 0.7–4.0)
MCHC: 34.4 g/dL (ref 30.0–36.0)
MCV: 96.2 fl (ref 78.0–100.0)
Monocytes Absolute: 0.6 10*3/uL (ref 0.1–1.0)
Monocytes Relative: 17.4 % — ABNORMAL HIGH (ref 3.0–12.0)
NEUTROS ABS: 1.1 10*3/uL — AB (ref 1.4–7.7)
NEUTROS PCT: 28.4 % — AB (ref 43.0–77.0)
PLATELETS: 183 10*3/uL (ref 150.0–400.0)
RBC: 4.19 Mil/uL — ABNORMAL LOW (ref 4.22–5.81)
RDW: 12.8 % (ref 11.5–15.5)
WBC: 3.7 10*3/uL — ABNORMAL LOW (ref 4.0–10.5)

## 2018-06-10 LAB — BASIC METABOLIC PANEL
BUN: 18 mg/dL (ref 6–23)
CO2: 33 meq/L — AB (ref 19–32)
Calcium: 9.7 mg/dL (ref 8.4–10.5)
Chloride: 100 mEq/L (ref 96–112)
Creatinine, Ser: 1.22 mg/dL (ref 0.40–1.50)
GFR: 75.44 mL/min (ref >60.00)
Glucose, Bld: 86 mg/dL (ref 70–99)
Potassium: 4.3 mEq/L (ref 3.5–5.1)
SODIUM: 138 meq/L (ref 135–145)

## 2018-06-10 LAB — HEPATIC FUNCTION PANEL
ALBUMIN: 4.3 g/dL (ref 3.5–5.2)
ALK PHOS: 60 U/L (ref 39–117)
ALT: 14 U/L (ref 0–53)
AST: 17 U/L (ref 0–37)
Bilirubin, Direct: 0.2 mg/dL (ref 0.0–0.3)
Total Bilirubin: 0.7 mg/dL (ref 0.2–1.2)
Total Protein: 7.5 g/dL (ref 6.0–8.3)

## 2018-06-10 LAB — LIPID PANEL
CHOLESTEROL: 163 mg/dL (ref 0–200)
HDL: 78 mg/dL (ref >39.00)
LDL CALC: 66 mg/dL (ref 0–99)
NonHDL: 85.1
Total CHOL/HDL Ratio: 2
Triglycerides: 94 mg/dL (ref 0.0–149.0)
VLDL: 18.8 mg/dL (ref 0.0–40.0)

## 2018-06-10 LAB — TROPONIN I: TNIDX: 0.01 ug/l (ref 0.00–0.06)

## 2018-06-10 NOTE — Telephone Encounter (Signed)
Pt walked in;CP on lt side of chest that started 06/09/18; dull CP that comes and goes; last CP 7 AM this morning. Had similar pain 2 -3 months ago but went away after taking tums. No CP, SOB,H/A or dizziness now. If rubs chest seems to help pain. Took tums on 06/09/18 but none today. Pt does not appear in any distress. Wt 173.12; T 98.1  P 52  BP 120/80 reg cuff LA sitting and pulse ox room air 98%. Pt has appt to see Dr Lorelei Pont today at [facility]. FYI to Dr Lorelei Pont.

## 2018-06-10 NOTE — Patient Instructions (Signed)
REFERRALS TO SPECIALISTS, SPECIAL TESTS (MRI, CT, ULTRASOUNDS)  MARION or  Anastasiya will help you. ASK CHECK-IN FOR HELP.  Specialist appointment times vary a great deal, based on their schedule / openings. -- Some specialists have very long wait times. (Example. Dermatology)    

## 2018-06-10 NOTE — Progress Notes (Signed)
Dr. Frederico Hamman T. Cassia Fein, MD, [facility] [place], [address] Phone: [phone] Fax: [phone]  06/10/2018  Patient: Terry Russo, MRN: [id number], DOB: 17-Sep-1945, 72 y.o.  Primary Physician:  Terry Fenton, Terry Russo   Chief Complaint  Patient presents with  . Chest Pain   Subjective:   Terry Russo is a 72 y.o. very pleasant male patient who presents with the following:  Chest pain - some pain yesterday. Pain in the middle and over in the L armpit.  I saw him in 10/2017 and he was having some chest pain at that time with BP 200/100.  Recently forgot to take his BP meds when he ran out.   He was having CP yesterday and early this morning.   No CP now, no SOB. No dyspnea.   No CP in many months up until now.  No palpitations.   Risk factors:  HTN H/o TIA Quit smoking 20 years. (30 pack years) No cardiac history No drug history  Past Medical History, Surgical History, Social History, Family History, Problem List, Medications, and Allergies have been reviewed and updated if relevant.  Patient Active Problem List   Diagnosis Date Noted  . TIA (transient ischemic attack) 06/03/2016  . Diverticulosis of colon (without mention of hemorrhage) 10/31/2011  . HTN (hypertension) 10/18/2011    Past Medical History:  Diagnosis Date  . Chicken pox   . Hypertension   . Right eye injury    pt has glass eye    Past Surgical History:  Procedure Laterality Date  . ABDOMINAL SURGERY    . AMPUTATION FINGER / THUMB     left  . COLONOSCOPY  10/31/2011   Procedure: COLONOSCOPY;  Surgeon: Owens Loffler, MD;  Location: [facility];  Service: Endoscopy;  Laterality: N/A;  . ENUCLEATION     glass eye  . HERNIA REPAIR      Social History   Socioeconomic History  . Marital status: Married    Spouse name: Bonnita Nasuti  . Number of children: 7  . Years of education: Not on file  . Highest education level: Not on file    Occupational History  . Occupation: welder---retired  Social Needs  . Financial resource strain: Not on file  . Food insecurity:    Worry: Not on file    Inability: Not on file  . Transportation needs:    Medical: Not on file    Non-medical: Not on file  Tobacco Use  . Smoking status: Former Smoker    Packs/day: 1.50    Years: 20.00    Pack years: 30.00    Types: Cigarettes    Last attempt to quit: 10/11/2003    Years since quitting: 14.6  . Smokeless tobacco: Never Used  Substance and Sexual Activity  . Alcohol use: Yes    Alcohol/week: 1.0 standard drinks    Types: 1 Cans of beer per week    Comment: half to a whole beer - occasionally (social)  . Drug use: No  . Sexual activity: Not on file  Lifestyle  . Physical activity:    Days per week: Not on file    Minutes per session: Not on file  . Stress: Not on file  Relationships  . Social connections:    Talks on phone: Not on file    Gets together: Not on file    Attends religious service: Not on file    Active member of  club or organization: Not on file    Attends meetings of clubs or organizations: Not on file    Relationship status: Not on file  . Intimate partner violence:    Fear of current or ex partner: Not on file    Emotionally abused: Not on file    Physically abused: Not on file    Forced sexual activity: Not on file  Other Topics Concern  . Not on file  Social History Narrative   Lives with wife at home    Family History  Problem Relation Age of Onset  . Hypertension Sister   . Hypertension Sister   . Diabetes Brother   . Cancer Neg Hx   . Heart disease Neg Hx   . Stroke Neg Hx     No Known Allergies  Medication list reviewed and updated in full in [facility].  GEN: No acute illnesses, no fevers, chills. GI: No n/v/d, eating normally Pulm: No SOB Interactive and getting along well at home.  Otherwise, ROS is as per the HPI.  Objective:   BP 120/80   Pulse (!) 52   Temp 98.1  F (36.7 C) (Oral)   Ht 5\' 10"  (1.778 m)   Wt 173 lb 12 oz (78.8 kg)   SpO2 98%   BMI 24.93 kg/m   GEN: WDWN, NAD, Non-toxic, A & O x 3 HEENT: Atraumatic, Normocephalic. Neck supple. No masses, No LAD. Ears and Nose: No external deformity. CV: RRR, No M/G/R. No JVD. No thrill. No extra heart sounds. PULM: CTA B, no wheezes, crackles, rhonchi. No retractions. No resp. distress. No accessory muscle use. EXTR: No c/c/e NEURO Normal gait.  PSYCH: Normally interactive. Conversant. Not depressed or anxious appearing.  Calm demeanor.   Laboratory and Imaging Data:  Assessment and Plan:   Chest pain, unspecified type - Plan: EKG 57-QION, Basic metabolic panel, CBC with Differential/Platelet, Hepatic function panel, Lipid panel, Troponin I, Ambulatory referral to Cardiology  TIA (transient ischemic attack)  Essential hypertension  EKG: Sinus brady. Normal axis, normal R wave progression, No acute ST elevation or depression. J pole elevation v2 and v3. Similar to 10/2017 EKG  Recurrent chest pain in a 72 year old with risk factors.  No chest pain right now.   Check stat Troponins - to hospital if +  He needs further cardiac work-up - will send to Cardiology  Follow-up: Cards  Orders Placed This Encounter  Procedures  . Basic metabolic panel  . CBC with Differential/Platelet  . Hepatic function panel  . Lipid panel  . Troponin I  . Ambulatory referral to Cardiology  . EKG 12-Lead    Signed,  Andera Cranmer T. Mylinda Brook, MD   Allergies as of 06/10/2018   No Known Allergies     Medication List        Accurate as of 06/10/18  1:51 PM. Always use your most recent med list.          amLODipine 10 MG tablet Commonly known as:  NORVASC Take 1 tablet (10 mg total) by mouth daily.   aspirin 81 MG tablet Take 81 mg by mouth daily.   DAILY-VITAMIN Tabs Take 1 tablet by mouth daily.   lisinopril-hydrochlorothiazide 20-25 MG tablet Commonly known as:   PRINZIDE,ZESTORETIC Take 2 tablets by mouth daily.

## 2018-06-15 DIAGNOSIS — R001 Bradycardia, unspecified: Secondary | ICD-10-CM | POA: Diagnosis not present

## 2018-06-15 DIAGNOSIS — Z87891 Personal history of nicotine dependence: Secondary | ICD-10-CM | POA: Diagnosis not present

## 2018-06-15 DIAGNOSIS — I10 Essential (primary) hypertension: Secondary | ICD-10-CM | POA: Diagnosis not present

## 2018-06-15 DIAGNOSIS — R0789 Other chest pain: Secondary | ICD-10-CM | POA: Diagnosis not present

## 2018-07-30 ENCOUNTER — Other Ambulatory Visit: Payer: Self-pay | Admitting: Family Medicine

## 2018-07-30 DIAGNOSIS — I10 Essential (primary) hypertension: Secondary | ICD-10-CM

## 2018-08-31 DIAGNOSIS — R001 Bradycardia, unspecified: Secondary | ICD-10-CM | POA: Diagnosis not present

## 2018-08-31 DIAGNOSIS — R0789 Other chest pain: Secondary | ICD-10-CM | POA: Diagnosis not present

## 2018-09-11 DIAGNOSIS — R079 Chest pain, unspecified: Secondary | ICD-10-CM | POA: Diagnosis not present

## 2018-09-18 DIAGNOSIS — Z87891 Personal history of nicotine dependence: Secondary | ICD-10-CM | POA: Diagnosis not present

## 2018-09-18 DIAGNOSIS — R0789 Other chest pain: Secondary | ICD-10-CM | POA: Diagnosis not present

## 2018-09-18 DIAGNOSIS — I10 Essential (primary) hypertension: Secondary | ICD-10-CM | POA: Diagnosis not present

## 2018-09-18 DIAGNOSIS — R001 Bradycardia, unspecified: Secondary | ICD-10-CM | POA: Diagnosis not present

## 2018-10-01 DIAGNOSIS — R079 Chest pain, unspecified: Secondary | ICD-10-CM | POA: Diagnosis not present

## 2018-10-01 DIAGNOSIS — Z136 Encounter for screening for cardiovascular disorders: Secondary | ICD-10-CM | POA: Diagnosis not present

## 2018-10-01 DIAGNOSIS — R001 Bradycardia, unspecified: Secondary | ICD-10-CM | POA: Diagnosis not present

## 2018-10-08 ENCOUNTER — Ambulatory Visit: Payer: Medicare HMO | Admitting: Cardiovascular Disease

## 2018-10-08 ENCOUNTER — Encounter: Payer: Self-pay | Admitting: Cardiology

## 2018-10-08 VITALS — BP 164/85 | HR 69 | Ht 70.0 in | Wt 181.0 lb

## 2018-10-08 DIAGNOSIS — R072 Precordial pain: Secondary | ICD-10-CM | POA: Diagnosis not present

## 2018-10-08 NOTE — Progress Notes (Signed)
Chief Complaint  Patient presents with  . New Patient (Initial Visit)    Chest pain    History of Present Illness: 73 yo male with history of HTN, former tobacco abuse here today as a new patient for a second opinion and to establish cardiology care. He was seen in [facility] recently by Dr. Virgina Jock for evaluation of chest pain. He has a history of TIA in 2017 as well as HTN. He has a 30 pack year history of tobacco abuse. He stopped smoking in 1999. Echo January 2020 in [facility] with normal LV size, moderate LVH, normal systolic and diastolic function with XBJY=78%. There was mild tricuspid regurgitation. Mild dilation aortic root at 3.9 cm. Exercise stress test in January 2020 with no ischemia. Abdominal aortic u/s with no evidence of AAA. His chest pain is at rest and relieved with belching. No exertional chest pain. His pain was not felt to be cardiac related when seen in follow up in Dr. Bonney Roussel office.   He tells me today that he gets a "knot" across his chest while at rest. No exertional chest pain. The pain moves from left to right across his upper chest.No associated symptoms of dizziness, diaphoresis or dyspnea. The pain lasts for a few seconds. Responds to Tums. He says the pain improves with belching. At times he can make it go away by massaging the area. He is very active. 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   Primary Care Physician: Jearld Fenton, NP  Past Medical History:  Diagnosis Date  . Chicken pox   . Hypertension   . Right eye injury    pt has glass eye  . TIA (transient ischemic attack) 2017    Past Surgical History:  Procedure Laterality Date  . ABDOMINAL SURGERY    . AMPUTATION FINGER / THUMB     left  . COLONOSCOPY  10/31/2011   Procedure: COLONOSCOPY;  Surgeon: Owens Loffler, MD;  Location: [facility];  Service: Endoscopy;  Laterality: N/A;  . ENUCLEATION     glass eye  . HERNIA REPAIR       Current Outpatient Medications  Medication Sig Dispense Refill  . amLODipine (NORVASC) 10 MG tablet Take 1 tablet (10 mg total) by mouth daily. 90 tablet 3  . aspirin 81 MG tablet Take 81 mg by mouth daily.    Marland Kitchen lisinopril-hydrochlorothiazide (PRINZIDE,ZESTORETIC) 20-25 MG tablet TAKE 1 TABLET BY MOUTH ONCE DAILY 90 tablet 0  . Multiple Vitamin (DAILY-VITAMIN) TABS Take 1 tablet by mouth daily.      No current facility-administered medications for this visit.     No Known Allergies  Social History   Socioeconomic History  . Marital status: Married    Spouse name: Bonnita Nasuti  . Number of children: 7  . Years of education: Not on file  . Highest education level: Not on file  Occupational History  . Occupation: Retired Building control surveyor, now doing Secretary/administrator  Social Needs  . Financial resource strain: Not on file  . Food insecurity:    Worry: Not on file    Inability: Not on file  . Transportation needs:    Medical: Not on file    Non-medical: Not on file  Tobacco Use  . Smoking status: Former Smoker    Packs/day: 1.50    Years: 20.00    Pack years: 30.00    Types: Cigarettes    Last attempt to quit: 09/09/1997    Years since quitting:  21.0  . Smokeless tobacco: Never Used  Substance and Sexual Activity  . Alcohol use: Yes    Alcohol/week: 1.0 standard drinks    Types: 1 Cans of beer per week    Comment: half to a whole beer - occasionally (social)  . Drug use: No  . Sexual activity: Not on file  Lifestyle  . Physical activity:    Days per week: Not on file    Minutes per session: Not on file  . Stress: Not on file  Relationships  . Social connections:    Talks on phone: Not on file    Gets together: Not on file    Attends religious service: Not on file    Active member of club or organization: Not on file    Attends meetings of clubs or organizations: Not on file    Relationship status: Not on file  . Intimate partner violence:    Fear of current or ex partner:  Not on file    Emotionally abused: Not on file    Physically abused: Not on file    Forced sexual activity: Not on file  Other Topics Concern  . Not on file  Social History Narrative   Lives with wife at home    Family History  Problem Relation Age of Onset  . Hypertension Mother   . CVA Father   . Hypertension Sister   . Hypertension Sister   . Diabetes Brother   . Cancer Neg Hx   . Heart disease Neg Hx   . Stroke Neg Hx     Review of Systems:  As stated in the HPI and otherwise negative.   BP (!) 164/85   Pulse 69   Ht 5\' 10"  (1.778 m)   Wt 181 lb (82.1 kg)   SpO2 95%   BMI 25.97 kg/m   Physical Examination: General: Well developed, well nourished, NAD  HEENT: OP clear, mucus membranes moist  SKIN: warm, dry. No rashes. Neuro: No focal deficits  Musculoskeletal: Muscle strength 5/5 all ext  Psychiatric: Mood and affect normal  Neck: No JVD, no carotid bruits, no thyromegaly, no lymphadenopathy.  Lungs:Clear bilaterally, no wheezes, rhonci, crackles Cardiovascular: Regular rate and rhythm. No murmurs, gallops or rubs. Abdomen:Soft. Bowel sounds present. Non-tender.  Extremities: No lower extremity edema. Pulses are 2 + in the bilateral DP/PT.  EKG:  EKG is not ordered today. The ekg from [facility] is reviewed by me and shows sinus rhythm with repolarization abnormaliity.   Recent Labs: 06/10/2018: ALT 14; BUN 18; Creatinine, Ser 1.22; Hemoglobin 13.9; Platelets 183.0; Potassium 4.3; Sodium 138   Lipid Panel    Component Value Date/Time   CHOL 163 06/10/2018 1300   TRIG 94.0 06/10/2018 1300   HDL 78.00 06/10/2018 1300   CHOLHDL 2 06/10/2018 1300   VLDL 18.8 06/10/2018 1300   LDLCALC 66 06/10/2018 1300     Wt Readings from Last 3 Encounters:  10/08/18 181 lb (82.1 kg)  06/10/18 173 lb 12 oz (78.8 kg)  02/16/18 179 lb 8 oz (81.4 kg)     Other studies Reviewed: Additional studies/ records that were reviewed today include:  Review of the  above records demonstrates:   Assessment and Plan:   1. Chest pain: His pain is atypical and only occurs at rest. No exertional chest pain. No associated N/V/diaphoresis/dyspnea/dizziness. The pain responds to belching and lasts for only a few seconds. He has not yet tried a PPI or H2 blocker. Exercise stress test  without ischemia 08/31/18 (report scanned from [facility] Echo with normal LV systolic function (scanned).  -I do not think his pain is cardiac. I have asked him to start Pepcid or Prilosec OTC.  -He will call with change in character/intensity of symptoms  Current medicines are reviewed at length with the patient today.  The patient does not have concerns regarding medicines.  The following changes have been made:  no change  Labs/ tests ordered today include:  No orders of the defined types were placed in this encounter.   Disposition:   FU with me in 6 months   Signed, Lauree Chandler, MD 10/08/2018 2:56 PM    [facility] [address], [place], [place]  [address] Phone: [phone]; Fax: [phone]

## 2018-10-08 NOTE — Patient Instructions (Signed)
Medication Instructions:  Your physician recommends that you continue on your current medications as directed. Please refer to the Current Medication list given to you today.  If you need a refill on your cardiac medications before your next appointment, please call your pharmacy.   Lab work: None If you have labs (blood work) drawn today and your tests are completely normal, you will receive your results only by: Marland Kitchen MyChart Message (if you have MyChart) OR . A paper copy in the mail If you have any lab test that is abnormal or we need to change your treatment, we will call you to review the results.  Testing/Procedures: None  Follow-Up: At [facility], you and your health needs are our priority.  As part of our continuing mission to provide you with exceptional heart care, we have created designated Provider Care Teams.  These Care Teams include your primary Cardiologist (physician) and Advanced Practice Providers (APPs -  Physician Assistants and Nurse Practitioners) who all work together to provide you with the care you need, when you need it. You will need a follow up appointment in 6 months.  Please call our office 2 months in advance to schedule this appointment.  You may see Dr. Angelena Form or one of the following Advanced Practice Providers on your designated Care Team:   [place], PA-C Melina Copa, PA-C . Ermalinda Barrios, PA-C  Any Other Special Instructions Will Be Listed Below (If Applicable).

## 2018-11-30 ENCOUNTER — Other Ambulatory Visit: Payer: Self-pay | Admitting: Family Medicine

## 2018-12-24 ENCOUNTER — Other Ambulatory Visit: Payer: Self-pay | Admitting: Cardiology

## 2018-12-24 DIAGNOSIS — I77810 Thoracic aortic ectasia: Secondary | ICD-10-CM

## 2019-01-21 ENCOUNTER — Ambulatory Visit (INDEPENDENT_AMBULATORY_CARE_PROVIDER_SITE_OTHER): Payer: Medicare HMO | Admitting: Internal Medicine

## 2019-01-21 ENCOUNTER — Encounter: Payer: Self-pay | Admitting: Internal Medicine

## 2019-01-21 ENCOUNTER — Other Ambulatory Visit: Payer: Self-pay

## 2019-01-21 VITALS — BP 154/86 | HR 61 | Temp 98.2°F | Wt 184.0 lb

## 2019-01-21 DIAGNOSIS — I10 Essential (primary) hypertension: Secondary | ICD-10-CM | POA: Diagnosis not present

## 2019-01-21 DIAGNOSIS — M25511 Pain in right shoulder: Secondary | ICD-10-CM | POA: Diagnosis not present

## 2019-01-21 MED ORDER — LISINOPRIL-HYDROCHLOROTHIAZIDE 20-25 MG PO TABS: 1.0000 | ORAL_TABLET | Freq: Every day | ORAL | 3 refills | Status: DC

## 2019-01-21 MED ORDER — NAPROXEN 375 MG PO TABS: 375.0000 mg | ORAL_TABLET | Freq: Two times a day (BID) | ORAL | 0 refills | Status: DC

## 2019-01-21 MED ORDER — CYCLOBENZAPRINE HCL 10 MG PO TABS: 10.0000 mg | ORAL_TABLET | Freq: Every day | ORAL | 0 refills | Status: DC

## 2019-01-21 NOTE — Patient Instructions (Signed)
Shoulder Exercises Ask your health care provider which exercises are safe for you. Do exercises exactly as told by your health care provider and adjust them as directed. It is normal to feel mild stretching, pulling, tightness, or discomfort as you do these exercises, but you should stop right away if you feel sudden pain or your pain gets worse.Do not begin these exercises until told by your health care provider. Range of Motion Exercises        These exercises warm up your muscles and joints and improve the movement and flexibility of your shoulder. These exercises also help to relieve pain, numbness, and tingling. These exercises involve stretching your injured shoulder directly. Exercise A: Pendulum 1. Stand near a wall or a surface that you can hold onto for balance. 2. Bend at the waist and let your left / right arm hang straight down. Use your other arm to support you. Keep your back straight and do not lock your knees. 3. Relax your left / right arm and shoulder muscles, and move your hips and your trunk so your left / right arm swings freely. Your arm should swing because of the motion of your body, not because you are using your arm or shoulder muscles. 4. Keep moving your body so your arm swings in the following directions, as told by your health care provider: ? Side to side. ? Forward and backward. ? In clockwise and counterclockwise circles. 5. Continue each motion for __________ seconds, or for as long as told by your health care provider. 6. Slowly return to the starting position. Repeat __________ times. Complete this exercise __________ times a day. Exercise B:Flexion, Standing 1. Stand and hold a broomstick, a cane, or a similar object. Place your hands a little more than shoulder-width apart on the object. Your left / right hand should be palm-up, and your other hand should be palm-down. 2. Keep your elbow straight and keep your shoulder muscles relaxed. Push the stick  down with your healthy arm to raise your left / right arm in front of your body, and then over your head until you feel a stretch in your shoulder. ? Avoid shrugging your shoulder while you raise your arm. Keep your shoulder blade tucked down toward the middle of your back. 3. Hold for __________ seconds. 4. Slowly return to the starting position. Repeat __________ times. Complete this exercise __________ times a day. Exercise C: Abduction, Standing 1. Stand and hold a broomstick, a cane, or a similar object. Place your hands a little more than shoulder-width apart on the object. Your left / right hand should be palm-up, and your other hand should be palm-down. 2. While keeping your elbow straight and your shoulder muscles relaxed, push the stick across your body toward your left / right side. Raise your left / right arm to the side of your body and then over your head until you feel a stretch in your shoulder. ? Do not raise your arm above shoulder height, unless your health care provider tells you to do that. ? Avoid shrugging your shoulder while you raise your arm. Keep your shoulder blade tucked down toward the middle of your back. 3. Hold for __________ seconds. 4. Slowly return to the starting position. Repeat __________ times. Complete this exercise __________ times a day. Exercise D:Internal Rotation 1. Place your left / right hand behind your back, palm-up. 2. Use your other hand to dangle an exercise band, a towel, or a similar object over your shoulder.   Grasp the band with your left / right hand so you are holding onto both ends. 3. Gently pull up on the band until you feel a stretch in the front of your left / right shoulder. ? Avoid shrugging your shoulder while you raise your arm. Keep your shoulder blade tucked down toward the middle of your back. 4. Hold for __________ seconds. 5. Release the stretch by letting go of the band and lowering your hands. Repeat __________ times.  Complete this exercise __________ times a day. Stretching Exercises  These exercises warm up your muscles and joints and improve the movement and flexibility of your shoulder. These exercises also help to relieve pain, numbness, and tingling. These exercises are done using your healthy shoulder to help stretch the muscles of your injured shoulder. Exercise E: Corner Stretch (External Rotation and Abduction) 1. Stand in a doorway with one of your feet slightly in front of the other. This is called a staggered stance. If you cannot reach your forearms to the door frame, stand facing a corner of a room. 2. Choose one of the following positions as told by your health care provider: ? Place your hands and forearms on the door frame above your head. ? Place your hands and forearms on the door frame at the height of your head. ? Place your hands on the door frame at the height of your elbows. 3. Slowly move your weight onto your front foot until you feel a stretch across your chest and in the front of your shoulders. Keep your head and chest upright and keep your abdominal muscles tight. 4. Hold for __________ seconds. 5. To release the stretch, shift your weight to your back foot. Repeat __________ times. Complete this stretch __________ times a day. Exercise F:Extension, Standing 1. Stand and hold a broomstick, a cane, or a similar object behind your back. ? Your hands should be a little wider than shoulder-width apart. ? Your palms should face away from your back. 2. Keeping your elbows straight and keeping your shoulder muscles relaxed, move the stick away from your body until you feel a stretch in your shoulder. ? Avoid shrugging your shoulders while you move the stick. Keep your shoulder blade tucked down toward the middle of your back. 3. Hold for __________ seconds. 4. Slowly return to the starting position. Repeat __________ times. Complete this exercise __________ times a  day. Strengthening Exercises           These exercises build strength and endurance in your shoulder. Endurance is the ability to use your muscles for a long time, even after they get tired. Exercise G:External Rotation 1. Sit in a stable chair without armrests. 2. Secure an exercise band at elbow height on your left / right side. 3. Place a soft object, such as a folded towel or a small pillow, between your left / right upper arm and your body to move your elbow a few inches away (about 10 cm) from your side. 4. Hold the end of the band so it is tight and there is no slack. 5. Keeping your elbow pressed against the soft object, move your left / right forearm out, away from your abdomen. Keep your body steady so only your forearm moves. 6. Hold for __________ seconds. 7. Slowly return to the starting position. Repeat __________ times. Complete this exercise __________ times a day. Exercise H:Shoulder Abduction 1. Sit in a stable chair without armrests, or stand. 2. Hold a __________ weight in your   left / right hand, or hold an exercise band with both hands. 3. Start with your arms straight down and your left / right palm facing in, toward your body. 4. Slowly lift your left / right hand out to your side. Do not lift your hand above shoulder height unless your health care provider tells you that this is safe. ? Keep your arms straight. ? Avoid shrugging your shoulder while you do this movement. Keep your shoulder blade tucked down toward the middle of your back. 5. Hold for __________ seconds. 6. Slowly lower your arm, and return to the starting position. Repeat __________ times. Complete this exercise __________ times a day. Exercise I:Shoulder Extension 1. Sit in a stable chair without armrests, or stand. 2. Secure an exercise band to a stable object in front of you where it is at shoulder height. 3. Hold one end of the exercise band in each hand. Your palms should face each  other. 4. Straighten your elbows and lift your hands up to shoulder height. 5. Step back, away from the secured end of the exercise band, until the band is tight and there is no slack. 6. Squeeze your shoulder blades together as you pull your hands down to the sides of your thighs. Stop when your hands are straight down by your sides. Do not let your hands go behind your body. 7. Hold for __________ seconds. 8. Slowly return to the starting position. Repeat __________ times. Complete this exercise __________ times a day. Exercise J:Standing Shoulder Row 1. Sit in a stable chair without armrests, or stand. 2. Secure an exercise band to a stable object in front of you so it is at waist height. 3. Hold one end of the exercise band in each hand. Your palms should be in a thumbs-up position. 4. Bend each of your elbows to an "L" shape (about 90 degrees) and keep your upper arms at your sides. 5. Step back until the band is tight and there is no slack. 6. Slowly pull your elbows back behind you. 7. Hold for __________ seconds. 8. Slowly return to the starting position. Repeat __________ times. Complete this exercise __________ times a day. Exercise K:Shoulder Press-Ups 1. Sit in a stable chair that has armrests. Sit upright, with your feet flat on the floor. 2. Put your hands on the armrests so your elbows are bent and your fingers are pointing forward. Your hands should be about even with the sides of your body. 3. Push down on the armrests and use your arms to lift yourself off of the chair. Straighten your elbows and lift yourself up as much as you comfortably can. ? Move your shoulder blades down, and avoid letting your shoulders move up toward your ears. ? Keep your feet on the ground. As you get stronger, your feet should support less of your body weight as you lift yourself up. 4. Hold for __________ seconds. 5. Slowly lower yourself back into the chair. Repeat __________ times. Complete  this exercise __________ times a day. Exercise L: Wall Push-Ups 1. Stand so you are facing a stable wall. Your feet should be about one arm-length away from the wall. 2. Lean forward and place your palms on the wall at shoulder height. 3. Keep your feet flat on the floor as you bend your elbows and lean forward toward the wall. 4. Hold for __________ seconds. 5. Straighten your elbows to push yourself back to the starting position. Repeat __________ times. Complete this exercise __________ times   a day. This information is not intended to replace advice given to you by your health care provider. Make sure you discuss any questions you have with your health care provider. Document Released: 07/10/2005 Document Revised: 12/30/2017 Document Reviewed: 05/07/2015 Elsevier Interactive Patient Education  2019 [employer]  

## 2019-01-21 NOTE — Progress Notes (Signed)
Subjective:    Patient ID: Terry Russo, male    DOB: 1946-07-25, 73 y.o.   MRN: [id number]  HPI  Pt presents to the clinic today with c/o right shoulder pain. This started yesterday. He describes the pain as achy. The pain does not radiate. He denies numbness, tingling or weakness. He denies any injury to the area. He has not taken anything OTC for this.  Of note, his BP today is 154/86. He reports he is only taking the Amlodipine, not the Lisinopril HCT because the pharmacy never filled it. He denies headaches, visual changes, dizziness, chest pain or shortness of breath.    Review of Systems      Past Medical History:  Diagnosis Date  . Chicken pox   . Hypertension   . Right eye injury    pt has glass eye  . TIA (transient ischemic attack) 2017    Current Outpatient Medications  Medication Sig Dispense Refill  . amLODipine (NORVASC) 10 MG tablet Take 1 tablet by mouth once daily 90 tablet 0  . aspirin 81 MG tablet Take 81 mg by mouth daily.    Marland Kitchen lisinopril-hydrochlorothiazide (PRINZIDE,ZESTORETIC) 20-25 MG tablet TAKE 1 TABLET BY MOUTH ONCE DAILY 90 tablet 0  . Multiple Vitamin (DAILY-VITAMIN) TABS Take 1 tablet by mouth daily.      No current facility-administered medications for this visit.     No Known Allergies  Family History  Problem Relation Age of Onset  . Hypertension Mother   . CVA Father   . Hypertension Sister   . Hypertension Sister   . Diabetes Brother   . Cancer Neg Hx   . Heart disease Neg Hx   . Stroke Neg Hx     Social History   Socioeconomic History  . Marital status: Married    Spouse name: Bonnita Nasuti  . Number of children: 7  . Years of education: Not on file  . Highest education level: Not on file  Occupational History  . Occupation: Retired Building control surveyor, now doing Secretary/administrator  Social Needs  . Financial resource strain: Not on file  . Food insecurity:    Worry: Not on file    Inability: Not on file  . Transportation needs:   Medical: Not on file    Non-medical: Not on file  Tobacco Use  . Smoking status: Former Smoker    Packs/day: 1.50    Years: 20.00    Pack years: 30.00    Types: Cigarettes    Last attempt to quit: 09/09/1997    Years since quitting: 21.3  . Smokeless tobacco: Never Used  Substance and Sexual Activity  . Alcohol use: Yes    Alcohol/week: 1.0 standard drinks    Types: 1 Cans of beer per week    Comment: half to a whole beer - occasionally (social)  . Drug use: No  . Sexual activity: Not on file  Lifestyle  . Physical activity:    Days per week: Not on file    Minutes per session: Not on file  . Stress: Not on file  Relationships  . Social connections:    Talks on phone: Not on file    Gets together: Not on file    Attends religious service: Not on file    Active member of club or organization: Not on file    Attends meetings of clubs or organizations: Not on file    Relationship status: Not on file  . Intimate partner violence:  Fear of current or ex partner: Not on file    Emotionally abused: Not on file    Physically abused: Not on file    Forced sexual activity: Not on file  Other Topics Concern  . Not on file  Social History Narrative   Lives with wife at home     Constitutional: Denies fever, malaise, fatigue, headache or abrupt weight changes.  Respiratory: Denies difficulty breathing, shortness of breath, cough or sputum production.   Cardiovascular: Denies chest pain, chest tightness, palpitations or swelling in the hands or feet.  Musculoskeletal: Pt reports right shoulder pain. Denies decrease in range of motion, difficulty with gait, muscle pain or joint swelling.  Skin: Denies redness, rashes, lesions or ulcercations.  Neurological: Denies dizziness, difficulty with memory, difficulty with speech or problems with balance and coordination.  Psych: Denies anxiety, depression, SI/HI.  No other specific complaints in a complete review of systems (except as  listed in HPI above).  Objective:   Physical Exam  BP (!) 154/86   Pulse 61   Temp 98.2 F (36.8 C) (Oral)   Wt 184 lb (83.5 kg)   SpO2 98%   BMI 26.40 kg/m  Wt Readings from Last 3 Encounters:  01/21/19 184 lb (83.5 kg)  10/08/18 181 lb (82.1 kg)  06/10/18 173 lb 12 oz (78.8 kg)    General: Appears his stated age, well developed, well nourished in NAD. Skin: Warm, dry and intact. No rashes noted. Cardiovascular: Normal rate and rhythm. S1,S2 noted.  No murmur, rubs or gallops noted.  Pulmonary/Chest: Normal effort and positive vesicular breath sounds. No respiratory distress. No wheezes, rales or ronchi noted.  Musculoskeletal: Normal flexion, extension and rotation of the cervical spine. No bony tenderness noted over the spine. Normal internal and external rotation of the right shoulder. Negative drop can test. Strength 5/5 BUE. Hand grips R>L (but missing 3 fingers on the left hand). Neurological: Alert and oriented. Marland Kitchen    BMET    Component Value Date/Time   NA 138 06/10/2018 1300   K 4.3 06/10/2018 1300   CL 100 06/10/2018 1300   CO2 33 (H) 06/10/2018 1300   GLUCOSE 86 06/10/2018 1300   BUN 18 06/10/2018 1300   CREATININE 1.22 06/10/2018 1300   CREATININE 1.40 (H) 10/27/2017 1620   CALCIUM 9.7 06/10/2018 1300   GFRNONAA >60 12/27/2015 1755   GFRAA >60 12/27/2015 1755    Lipid Panel     Component Value Date/Time   CHOL 163 06/10/2018 1300   TRIG 94.0 06/10/2018 1300   HDL 78.00 06/10/2018 1300   CHOLHDL 2 06/10/2018 1300   VLDL 18.8 06/10/2018 1300   LDLCALC 66 06/10/2018 1300    CBC    Component Value Date/Time   WBC 3.7 (L) 06/10/2018 1300   RBC 4.19 (L) 06/10/2018 1300   HGB 13.9 06/10/2018 1300   HCT 40.3 06/10/2018 1300   PLT 183.0 06/10/2018 1300   MCV 96.2 06/10/2018 1300   MCV 95.1 09/17/2014 1110   MCH 32.1 10/27/2017 1620   MCHC 34.4 06/10/2018 1300   RDW 12.8 06/10/2018 1300   LYMPHSABS 1.9 06/10/2018 1300   MONOABS 0.6 06/10/2018 1300    EOSABS 0.1 06/10/2018 1300   BASOSABS 0.0 06/10/2018 1300    Hgb A1C Lab Results  Component Value Date   HGBA1C 5.9 06/03/2016            Assessment & Plan:   Right Shoulder Pain:  Seems muscular Advised him to avoid sleeping  on that side Shoulder exercises given RX for Naproxen 375 mg BID - avoid all other OTC NSAID's RX for Flexeril 10 mg QHS- sedation caution given Will xray if worse  HTN:  Uncontrolled Continue Amlodipine Refilled Lisinopril HCT and advised him to take as prescribed Reinforced DASH diet and exercise for weight loss  RTC in 2 weeks for BP check Webb Silversmith, NP

## 2019-02-04 ENCOUNTER — Ambulatory Visit: Payer: Medicare HMO

## 2019-03-03 ENCOUNTER — Encounter: Payer: Medicare HMO | Admitting: Internal Medicine

## 2019-03-16 ENCOUNTER — Other Ambulatory Visit: Payer: Medicare HMO

## 2019-03-16 DIAGNOSIS — Z5329 Procedure and treatment not carried out because of patient's decision for other reasons: Secondary | ICD-10-CM

## 2019-03-24 ENCOUNTER — Encounter: Payer: Self-pay | Admitting: Cardiology

## 2019-03-24 ENCOUNTER — Encounter: Payer: Medicare HMO | Admitting: Cardiology

## 2019-03-24 ENCOUNTER — Other Ambulatory Visit: Payer: Self-pay

## 2019-03-24 NOTE — Progress Notes (Signed)
Erroneous encounter

## 2019-04-06 ENCOUNTER — Ambulatory Visit (INDEPENDENT_AMBULATORY_CARE_PROVIDER_SITE_OTHER): Payer: Medicare HMO | Admitting: Internal Medicine

## 2019-04-06 ENCOUNTER — Encounter: Payer: Self-pay | Admitting: Internal Medicine

## 2019-04-06 ENCOUNTER — Ambulatory Visit (INDEPENDENT_AMBULATORY_CARE_PROVIDER_SITE_OTHER)
Admission: RE | Admit: 2019-04-06 | Discharge: 2019-04-06 | Disposition: A | Discharge status: Home / Self Care | Payer: Medicare HMO | Source: Ambulatory Visit | Attending: Internal Medicine | Admitting: Internal Medicine

## 2019-04-06 ENCOUNTER — Other Ambulatory Visit: Payer: Self-pay

## 2019-04-06 VITALS — BP 145/88 | HR 66 | Temp 98.5°F | Wt 185.8 lb

## 2019-04-06 DIAGNOSIS — M25561 Pain in right knee: Secondary | ICD-10-CM

## 2019-04-06 DIAGNOSIS — G8929 Other chronic pain: Secondary | ICD-10-CM | POA: Diagnosis not present

## 2019-04-06 MED ORDER — SILDENAFIL CITRATE 50 MG PO TABS: 25.0000 mg | ORAL_TABLET | Freq: Every day | ORAL | 0 refills | Status: DC | PRN

## 2019-04-06 MED ORDER — PREDNISONE 10 MG PO TABS: ORAL_TABLET | ORAL | 0 refills | Status: DC

## 2019-04-06 NOTE — Progress Notes (Signed)
Subjective:    Patient ID: Terry Russo, male    DOB: [birth year], 73 y.o.   MRN: [id number]  HPI  Pt presents to the clinic today with c/o right knee pain. This started 6 months it ago. It was intermittenty but seems more consistent. He describes the pain as sharp and stabbing. The pain does not radiate. He denies numbness, tingling or weakness in the right lower leg. He does feel like it is swollen. He denies any injury to the area. He has not tried any medication OTC. He has not tried ice. He wears a knee brace with some relief.  Review of Systems      Past Medical History:  Diagnosis Date  . Chicken pox   . Hypertension   . Right eye injury    pt has glass eye  . TIA (transient ischemic attack) 2017    Current Outpatient Medications  Medication Sig Dispense Refill  . amLODipine (NORVASC) 10 MG tablet Take 1 tablet by mouth once daily 90 tablet 0  . aspirin 81 MG tablet Take 81 mg by mouth daily.    Marland Kitchen lisinopril-hydrochlorothiazide (ZESTORETIC) 20-25 MG tablet Take 1 tablet by mouth daily. 90 tablet 3  . Multiple Vitamin (DAILY-VITAMIN) TABS Take 1 tablet by mouth daily.      No current facility-administered medications for this visit.     No Known Allergies  Family History  Problem Relation Age of Onset  . Hypertension Mother   . CVA Father   . Hypertension Sister   . Hypertension Sister   . Diabetes Brother   . Cancer Neg Hx   . Heart disease Neg Hx   . Stroke Neg Hx     Social History   Socioeconomic History  . Marital status: Widowed    Spouse name: Bonnita Nasuti  . Number of children: 7  . Years of education: Not on file  . Highest education level: Not on file  Occupational History  . Occupation: Retired Building control surveyor, now doing Secretary/administrator  Social Needs  . Financial resource strain: Not on file  . Food insecurity    Worry: Not on file    Inability: Not on file  . Transportation needs    Medical: Not on file    Non-medical: Not on file  Tobacco Use   . Smoking status: Former Smoker    Packs/day: 1.50    Years: 20.00    Pack years: 30.00    Types: Cigarettes    Quit date: 09/09/1997    Years since quitting: 21.5  . Smokeless tobacco: Never Used  Substance and Sexual Activity  . Alcohol use: Yes    Alcohol/week: 1.0 standard drinks    Types: 1 Cans of beer per week    Comment: half to a whole beer - occasionally (social)  . Drug use: No  . Sexual activity: Not on file  Lifestyle  . Physical activity    Days per week: Not on file    Minutes per session: Not on file  . Stress: Not on file  Relationships  . Social Herbalist on phone: Not on file    Gets together: Not on file    Attends religious service: Not on file    Active member of club or organization: Not on file    Attends meetings of clubs or organizations: Not on file    Relationship status: Not on file  . Intimate partner violence    Fear of current or  ex partner: Not on file    Emotionally abused: Not on file    Physically abused: Not on file    Forced sexual activity: Not on file  Other Topics Concern  . Not on file  Social History Narrative   Lives with wife at home     Constitutional: Denies fever, malaise, fatigue, headache or abrupt weight changes.  Respiratory: Denies difficulty breathing, shortness of breath, cough or sputum production.   Cardiovascular: Denies chest pain, chest tightness, palpitations or swelling in the hands or feet.  Musculoskeletal: Pt reports right knee pain and swelling. Denies decrease in range of motion, difficulty with gait, muscle pain.  Skin: Denies redness, rashes, lesions or ulcercations.   No other specific complaints in a complete review of systems (except as listed in HPI above).  Objective:   Physical Exam   BP (!) 145/88   Pulse 66   Temp 98.5 F (36.9 C)   Wt 185 lb 12.8 oz (84.3 kg)   SpO2 95%   BMI 25.91 kg/m  Wt Readings from Last 3 Encounters:  04/06/19 185 lb 12.8 oz (84.3 kg)  03/24/19  185 lb (83.9 kg)  01/21/19 184 lb (83.5 kg)    General: Appears his stated age, well developed, well nourished in NAD. Skin: Warm, dry and intact. No redness or warmth noted of right knee. Musculoskeletal: Normal extension of the knee. Decreased flexion of the right knee. Joint enlarged without effusion. Pain with palpation of bilateral pes bursa and lateral joint line. Strength 5/5 BLE. No difficulty with gait.  Neurological: Alert and oriented.    BMET    Component Value Date/Time   NA 138 06/10/2018 1300   K 4.3 06/10/2018 1300   CL 100 06/10/2018 1300   CO2 33 (H) 06/10/2018 1300   GLUCOSE 86 06/10/2018 1300   BUN 18 06/10/2018 1300   CREATININE 1.22 06/10/2018 1300   CREATININE 1.40 (H) 10/27/2017 1620   CALCIUM 9.7 06/10/2018 1300   GFRNONAA >60 12/27/2015 1755   GFRAA >60 12/27/2015 1755    Lipid Panel     Component Value Date/Time   CHOL 163 06/10/2018 1300   TRIG 94.0 06/10/2018 1300   HDL 78.00 06/10/2018 1300   CHOLHDL 2 06/10/2018 1300   VLDL 18.8 06/10/2018 1300   LDLCALC 66 06/10/2018 1300    CBC    Component Value Date/Time   WBC 3.7 (L) 06/10/2018 1300   RBC 4.19 (L) 06/10/2018 1300   HGB 13.9 06/10/2018 1300   HCT 40.3 06/10/2018 1300   PLT 183.0 06/10/2018 1300   MCV 96.2 06/10/2018 1300   MCV 95.1 09/17/2014 1110   MCH 32.1 10/27/2017 1620   MCHC 34.4 06/10/2018 1300   RDW 12.8 06/10/2018 1300   LYMPHSABS 1.9 06/10/2018 1300   MONOABS 0.6 06/10/2018 1300   EOSABS 0.1 06/10/2018 1300   BASOSABS 0.0 06/10/2018 1300    Hgb A1C Lab Results  Component Value Date   HGBA1C 5.9 06/03/2016           Assessment & Plan:   Chronic Right Knee Pain:  Xray knee today Continue brace for comfort RX for Pred Taper x 9 days Consider PT vs referral to ortho pending xray  Will follow up after xray, return precautions discussed Webb Silversmith, NP

## 2019-04-06 NOTE — Patient Instructions (Signed)
Journal for Nurse Practitioners, 15(4), 263-267. Retrieved June 15, 2018 from http://clinicalkey.com/nursing">  Knee Exercises Ask your health care provider which exercises are safe for you. Do exercises exactly as told by your health care provider and adjust them as directed. It is normal to feel mild stretching, pulling, tightness, or discomfort as you do these exercises. Stop right away if you feel sudden pain or your pain gets worse. Do not begin these exercises until told by your health care provider. Stretching and range-of-motion exercises These exercises warm up your muscles and joints and improve the movement and flexibility of your knee. These exercises also help to relieve pain and swelling. Knee extension, prone 1. Lie on your abdomen (prone position) on a bed. 2. Place your left / right knee just beyond the edge of the surface so your knee is not on the bed. You can put a towel under your left / right thigh just above your kneecap for comfort. 3. Relax your leg muscles and allow gravity to straighten your knee (extension). You should feel a stretch behind your left / right knee. 4. Hold this position for __________ seconds. 5. Scoot up so your knee is supported between repetitions. Repeat __________ times. Complete this exercise __________ times a day. Knee flexion, active  1. Lie on your back with both legs straight. If this causes back discomfort, bend your left / right knee so your foot is flat on the floor. 2. Slowly slide your left / right heel back toward your buttocks. Stop when you feel a gentle stretch in the front of your knee or thigh (flexion). 3. Hold this position for __________ seconds. 4. Slowly slide your left / right heel back to the starting position. Repeat __________ times. Complete this exercise __________ times a day. Quadriceps stretch, prone  1. Lie on your abdomen on a firm surface, such as a bed or padded floor. 2. Bend your left / right knee and hold  your ankle. If you cannot reach your ankle or pant leg, loop a belt around your foot and grab the belt instead. 3. Gently pull your heel toward your buttocks. Your knee should not slide out to the side. You should feel a stretch in the front of your thigh and knee (quadriceps). 4. Hold this position for __________ seconds. Repeat __________ times. Complete this exercise __________ times a day. Hamstring, supine 1. Lie on your back (supine position). 2. Loop a belt or towel over the ball of your left / right foot. The ball of your foot is on the walking surface, right under your toes. 3. Straighten your left / right knee and slowly pull on the belt to raise your leg until you feel a gentle stretch behind your knee (hamstring). ? Do not let your knee bend while you do this. ? Keep your other leg flat on the floor. 4. Hold this position for __________ seconds. Repeat __________ times. Complete this exercise __________ times a day. Strengthening exercises These exercises build strength and endurance in your knee. Endurance is the ability to use your muscles for a long time, even after they get tired. Quadriceps, isometric This exercise stretches the muscles in front of your thigh (quadriceps) without moving your knee joint (isometric). 1. Lie on your back with your left / right leg extended and your other knee bent. Put a rolled towel or small pillow under your knee if told by your health care provider. 2. Slowly tense the muscles in the front of your left /   right thigh. You should see your kneecap slide up toward your hip or see increased dimpling just above the knee. This motion will push the back of the knee toward the floor. 3. For __________ seconds, hold the muscle as tight as you can without increasing your pain. 4. Relax the muscles slowly and completely. Repeat __________ times. Complete this exercise __________ times a day. Straight leg raises This exercise stretches the muscles in front  of your thigh (quadriceps) and the muscles that move your hips (hip flexors). 1. Lie on your back with your left / right leg extended and your other knee bent. 2. Tense the muscles in the front of your left / right thigh. You should see your kneecap slide up or see increased dimpling just above the knee. Your thigh may even shake a bit. 3. Keep these muscles tight as you raise your leg 4-6 inches (10-15 cm) off the floor. Do not let your knee bend. 4. Hold this position for __________ seconds. 5. Keep these muscles tense as you lower your leg. 6. Relax your muscles slowly and completely after each repetition. Repeat __________ times. Complete this exercise __________ times a day. Hamstring, isometric 1. Lie on your back on a firm surface. 2. Bend your left / right knee about __________ degrees. 3. Dig your left / right heel into the surface as if you are trying to pull it toward your buttocks. Tighten the muscles in the back of your thighs (hamstring) to "dig" as hard as you can without increasing any pain. 4. Hold this position for __________ seconds. 5. Release the tension gradually and allow your muscles to relax completely for __________ seconds after each repetition. Repeat __________ times. Complete this exercise __________ times a day. Hamstring curls If told by your health care provider, do this exercise while wearing ankle weights. Begin with __________ lb weights. Then increase the weight by 1 lb (0.5 kg) increments. Do not wear ankle weights that are more than __________ lb. 1. Lie on your abdomen with your legs straight. 2. Bend your left / right knee as far as you can without feeling pain. Keep your hips flat against the floor. 3. Hold this position for __________ seconds. 4. Slowly lower your leg to the starting position. Repeat __________ times. Complete this exercise __________ times a day. Squats This exercise strengthens the muscles in front of your thigh and knee  (quadriceps). 1. Stand in front of a table, with your feet and knees pointing straight ahead. You may rest your hands on the table for balance but not for support. 2. Slowly bend your knees and lower your hips like you are going to sit in a chair. ? Keep your weight over your heels, not over your toes. ? Keep your lower legs upright so they are parallel with the table legs. ? Do not let your hips go lower than your knees. ? Do not bend lower than told by your health care provider. ? If your knee pain increases, do not bend as low. 3. Hold the squat position for __________ seconds. 4. Slowly push with your legs to return to standing. Do not use your hands to pull yourself to standing. Repeat __________ times. Complete this exercise __________ times a day. Wall slides This exercise strengthens the muscles in front of your thigh and knee (quadriceps). 1. Lean your back against a smooth wall or door, and walk your feet out 18-24 inches (46-61 cm) from it. 2. Place your feet hip-width apart. 3.   Slowly slide down the wall or door until your knees bend __________ degrees. Keep your knees over your heels, not over your toes. Keep your knees in line with your hips. 4. Hold this position for __________ seconds. Repeat __________ times. Complete this exercise __________ times a day. Straight leg raises This exercise strengthens the muscles that rotate the leg at the hip and move it away from your body (hip abductors). 1. Lie on your side with your left / right leg in the top position. Lie so your head, shoulder, knee, and hip line up. You may bend your bottom knee to help you keep your balance. 2. Roll your hips slightly forward so your hips are stacked directly over each other and your left / right knee is facing forward. 3. Leading with your heel, lift your top leg 4-6 inches (10-15 cm). You should feel the muscles in your outer hip lifting. ? Do not let your foot drift forward. ? Do not let your knee  roll toward the ceiling. 4. Hold this position for __________ seconds. 5. Slowly return your leg to the starting position. 6. Let your muscles relax completely after each repetition. Repeat __________ times. Complete this exercise __________ times a day. Straight leg raises This exercise stretches the muscles that move your hips away from the front of the pelvis (hip extensors). 1. Lie on your abdomen on a firm surface. You can put a pillow under your hips if that is more comfortable. 2. Tense the muscles in your buttocks and lift your left / right leg about 4-6 inches (10-15 cm). Keep your knee straight as you lift your leg. 3. Hold this position for __________ seconds. 4. Slowly lower your leg to the starting position. 5. Let your leg relax completely after each repetition. Repeat __________ times. Complete this exercise __________ times a day. This information is not intended to replace advice given to you by your health care provider. Make sure you discuss any questions you have with your health care provider. Document Released: 07/10/2005 Document Revised: 06/16/2018 Document Reviewed: 06/16/2018 Elsevier Patient Education  2020 [employer]  

## 2019-05-04 ENCOUNTER — Other Ambulatory Visit: Payer: Medicare HMO

## 2019-05-11 ENCOUNTER — Telehealth: Payer: Self-pay

## 2019-05-11 ENCOUNTER — Ambulatory Visit: Payer: Medicare HMO | Admitting: Cardiology

## 2019-05-11 NOTE — Telephone Encounter (Signed)
Sara Chu (daughter) called not on DPR wanting to know about appointment today.  Could not give information.  She gave me pt number [phone] to call pt  Tried calling pt no answer.  Pt didn't have appointment here @ [facility] [phone] [address] [place]   It looked like they were trying to call him to r/s

## 2019-05-11 NOTE — Progress Notes (Deleted)
Primary Physician:  Jearld Fenton, NP   Patient ID: Terry Russo, male    DOB: 14-Mar-1946, 73 y.o.   MRN: [id number]  Subjective:    No chief complaint on file.   HPI: Terry Russo  is a 73 y.o. male  with hypertension, history of TIA in 2017, 30-pack-year history of tobacco use with cessation in 1999, recently evaluated by [place] for chest pain.  Patient underwent echocardiogram and routine treadmill stress test now presents for follow-up. He has continued to have occasional episodes of chest discomfort in his epigastric region. Symptoms improved with belching. Are not exacerbated by exertion. 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 Chest pain is not associated with shortness of breath, nausea and vomiting, or pain radiation.  He was previously very active with running a few years ago; however, now is not as active as he use to be. He denies any claudication symptoms. No syncope or palpitations.  Past Medical History:  Diagnosis Date  . Chicken pox   . Hypertension   . Right eye injury    pt has glass eye  . TIA (transient ischemic attack) 2017    Past Surgical History:  Procedure Laterality Date  . ABDOMINAL SURGERY    . AMPUTATION FINGER / THUMB     left  . COLONOSCOPY  10/31/2011   Procedure: COLONOSCOPY;  Surgeon: Owens Loffler, MD;  Location: [facility];  Service: Endoscopy;  Laterality: N/A;  . ENUCLEATION     glass eye  . HERNIA REPAIR      Social History   Socioeconomic History  . Marital status: Widowed    Spouse name: Terry Russo  . Number of children: 7  . Years of education: Not on file  . Highest education level: Not on file  Occupational History  . Occupation: Retired Building control surveyor, now doing Secretary/administrator  Social Needs  . Financial resource strain: Not on file  . Food insecurity    Worry: Not on file    Inability: Not on file  . Transportation needs    Medical: Not on file    Non-medical: Not on file  Tobacco Use  .  Smoking status: Former Smoker    Packs/day: 1.50    Years: 20.00    Pack years: 30.00    Types: Cigarettes    Quit date: 09/09/1997    Years since quitting: 21.6  . Smokeless tobacco: Never Used  Substance and Sexual Activity  . Alcohol use: Yes    Alcohol/week: 1.0 standard drinks    Types: 1 Cans of beer per week    Comment: half to a whole beer - occasionally (social)  . Drug use: No  . Sexual activity: Not on file  Lifestyle  . Physical activity    Days per week: Not on file    Minutes per session: Not on file  . Stress: Not on file  Relationships  . Social Herbalist on phone: Not on file    Gets together: Not on file    Attends religious service: Not on file    Active member of club or organization: Not on file    Attends meetings of clubs or organizations: Not on file    Relationship status: Not on file  . Intimate partner violence    Fear of current or ex partner: Not on file    Emotionally abused: Not on file    Physically abused: Not on file  Forced sexual activity: Not on file  Other Topics Concern  . Not on file  Social History Narrative   Lives with wife at home    Review of Systems  Constitution: Negative for decreased appetite, malaise/fatigue, weight gain and weight loss.  Eyes: Negative for visual disturbance.  Cardiovascular: Positive for chest pain. Negative for claudication, dyspnea on exertion, leg swelling, orthopnea, palpitations and syncope.  Respiratory: Negative for hemoptysis and wheezing.   Endocrine: Negative for cold intolerance and heat intolerance.  Hematologic/Lymphatic: Does not bruise/bleed easily.  Skin: Negative for nail changes.  Musculoskeletal: Positive for joint pain. Negative for muscle weakness and myalgias.  Gastrointestinal: Negative for abdominal pain, change in bowel habit, nausea and vomiting.  Neurological: Negative for difficulty with concentration, dizziness, focal weakness and headaches.   Psychiatric/Behavioral: Negative for altered mental status and suicidal ideas.  All other systems reviewed and are negative.     Objective:  There were no vitals taken for this visit. There is no height or weight on file to calculate BMI.    Physical Exam  Constitutional: He is oriented to person, place, and time. Vital signs are normal. He appears well-developed and well-nourished.  HENT:  Head: Normocephalic and atraumatic.  Neck: Normal range of motion.  Cardiovascular: Normal rate, regular rhythm, normal heart sounds and intact distal pulses.  Pulses:      Femoral pulses are 2+ on the right side with bruit and 2+ on the left side with bruit.      Popliteal pulses are 2+ on the right side and 2+ on the left side.       Dorsalis pedis pulses are 1+ on the right side and 1+ on the left side.       Posterior tibial pulses are 2+ on the right side and 2+ on the left side.  Pulmonary/Chest: Effort normal and breath sounds normal. No accessory muscle usage. No respiratory distress.  Abdominal: Soft. Bowel sounds are normal.  Musculoskeletal: Normal range of motion.  Neurological: He is alert and oriented to person, place, and time.  Skin: Skin is warm and dry.  Vitals reviewed.  Radiology: No results found.  Laboratory examination:   *** CMP Latest Ref Rng & Units 06/10/2018 11/04/2017 10/27/2017  Glucose 70 - 99 mg/dL 86 90 98  BUN 6 - 23 mg/dL 18 16 17   Creatinine 0.40 - 1.50 mg/dL 1.22 1.10 1.40(H)  Sodium 135 - 145 mEq/L 138 139 143  Potassium 3.5 - 5.1 mEq/L 4.3 4.0 4.3  Chloride 96 - 112 mEq/L 100 103 106  CO2 19 - 32 mEq/L 33(H) 30 32  Calcium 8.4 - 10.5 mg/dL 9.7 9.9 10.4(H)  Total Protein 6.0 - 8.3 g/dL 7.5 - 7.3  Total Bilirubin 0.2 - 1.2 mg/dL 0.7 - 0.8  Alkaline Phos 39 - 117 U/L 60 - -  AST 0 - 37 U/L 17 - 16  ALT 0 - 53 U/L 14 - 17   CBC Latest Ref Rng & Units 06/10/2018 10/27/2017 06/03/2016  WBC 4.0 - 10.5 K/uL 3.7(L) 5.0 5.7  Hemoglobin 13.0 - 17.0 g/dL 13.9  14.1 14.1  Hematocrit 39.0 - 52.0 % 40.3 41.3 40.6  Platelets 150.0 - 400.0 K/uL 183.0 163 180.0   Lipid Panel     Component Value Date/Time   CHOL 163 06/10/2018 1300   TRIG 94.0 06/10/2018 1300   HDL 78.00 06/10/2018 1300   CHOLHDL 2 06/10/2018 1300   VLDL 18.8 06/10/2018 1300   LDLCALC 66 06/10/2018 1300  HEMOGLOBIN A1C Lab Results  Component Value Date   HGBA1C 5.9 06/03/2016   TSH No results for input(s): TSH in the last 8760 hours.  PRN Meds:. There are no discontinued medications. No outpatient medications have been marked as taking for the 05/11/19 encounter (Appointment) with Miquel Dunn, NP.    Cardiac Studies:    Exercise Treadmill stress test 08/31/2018: Indication: CP The patient exercised on Bruce protocol for 7:45 min. Patient achieved 9.74 METS and reached HR 132 bpm, which is 88% of maximum age-predicted HR. Stress test terminated due to leg fatigue. Exercise capacity was normal. HR Response to Exercise: Appropriate. BP Response to Exercise: Resting hypertension with appropriate response. Chest Pain: None. Arrhythmias: none. Resting EKG demonstrates Normal sinus rhythm, baseline ST elevation in anteroseptal leads without Q waves, likely early repolarization. ST Changes: With peak exercise there was no ST-T changes of ischemia. Overall Impression: Normal stress test. Recommendations: Continue primary/secondary prevention.  Echocardiogram 09/11/2018: Left ventricle cavity is normal in size. Moderate concentric hypertrophy of the left ventricle. Normal global wall motion. Normal diastolic filling pattern. Calculated EF 55%. Mild tricuspid regurgitation. No evidence of pulmonary hypertension. The aortic root is mildly dilated at 3.9 cm. Bradycardia (R00.1)  EKG 06/15/2018: Sinus bradycardia at 45 bpm, normal axis, poor R wave progression. Early repolarization likely normal variant.  Assessment:   No diagnosis found.  ***  Recommendations:   ***   Miquel Dunn, MSN, APRN, FNP-C [facility]. [place] Office: [phone] Fax: [phone]

## 2019-05-11 NOTE — Telephone Encounter (Signed)
Unable to reach anyone by phone.

## 2019-05-11 NOTE — Telephone Encounter (Signed)
[place] Night - Client Nonclinical Telephone Record AccessNurse Client [facility] Night - Client Client Site [place] - Night Contact Type Call Who Is Calling Patient / Member / Family / Caregiver Caller Name Fritz Pickerel Phone Number [phone] Patient Name Terry Russo Patient DOB 02-05-2046 Call Type Message Only Information Provided Reason for Call Request for General Office Information Initial Comment Caller states that her dad has an appt today. She is needing the time. Additional Comment Call Closed By: Toma Deiters Transaction Date/Time: 05/11/2019 6:41:07 AM (ET)

## 2019-05-17 ENCOUNTER — Other Ambulatory Visit: Payer: Self-pay | Admitting: Internal Medicine

## 2019-05-19 NOTE — Telephone Encounter (Signed)
Last filled 04/06/2019 at [facility]... please advise

## 2019-05-27 ENCOUNTER — Other Ambulatory Visit: Payer: Self-pay

## 2019-05-27 ENCOUNTER — Ambulatory Visit (INDEPENDENT_AMBULATORY_CARE_PROVIDER_SITE_OTHER): Payer: Medicare HMO

## 2019-05-27 DIAGNOSIS — I77810 Thoracic aortic ectasia: Secondary | ICD-10-CM

## 2019-06-01 ENCOUNTER — Ambulatory Visit (INDEPENDENT_AMBULATORY_CARE_PROVIDER_SITE_OTHER): Payer: Medicare HMO | Admitting: Cardiology

## 2019-06-01 ENCOUNTER — Other Ambulatory Visit: Payer: Self-pay

## 2019-06-01 ENCOUNTER — Encounter: Payer: Self-pay | Admitting: Cardiology

## 2019-06-01 VITALS — BP 159/93 | HR 59 | Ht 71.0 in | Wt 187.4 lb

## 2019-06-01 DIAGNOSIS — I10 Essential (primary) hypertension: Secondary | ICD-10-CM

## 2019-06-01 DIAGNOSIS — G459 Transient cerebral ischemic attack, unspecified: Secondary | ICD-10-CM | POA: Diagnosis not present

## 2019-06-01 DIAGNOSIS — I77810 Thoracic aortic ectasia: Secondary | ICD-10-CM | POA: Diagnosis not present

## 2019-06-01 DIAGNOSIS — R0789 Other chest pain: Secondary | ICD-10-CM

## 2019-06-01 MED ORDER — VALSARTAN-HYDROCHLOROTHIAZIDE 320-25 MG PO TABS: 1.0000 | ORAL_TABLET | Freq: Every day | ORAL | 2 refills | Status: DC

## 2019-06-01 NOTE — Progress Notes (Signed)
Primary Physician:  Jearld Fenton, NP   Patient ID: Terry Russo, male    DOB: May 18, 1946, 73 y.o.   MRN: [id number]  Subjective:    Chief Complaint  Patient presents with  . aortic root dilation  . echo results    HPI: Terry Russo  is a 73 y.o. male  with hypertension, history of TIA in 2017, 30-pack-year history of tobacco use with cessation in 1999, evaluated by [place] 6 months ago for atypical chest pain.  Patient underwent echocardiogram in December 2019 revealing mild aortic root dilation at 3.9 cm, normal LVEF.  Treadmill stress testing was performed also at that time that was considered low risk.  He continues to have occasional episodes of chest discomfort, but improved from before.  Symptoms do not occur with exertion, often improved with belching.  He was previously very active with running a few years ago; however, now is not as active as he use to be. He denies any claudication symptoms. No syncope or palpitations. 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  Past Medical History:  Diagnosis Date  . Chicken pox   . Hypertension   . Right eye injury    pt has glass eye  . TIA (transient ischemic attack) 2017    Past Surgical History:  Procedure Laterality Date  . ABDOMINAL SURGERY    . AMPUTATION FINGER / THUMB     left  . COLONOSCOPY  10/31/2011   Procedure: COLONOSCOPY;  Surgeon: Owens Loffler, MD;  Location: [facility];  Service: Endoscopy;  Laterality: N/A;  . ENUCLEATION     glass eye  . HERNIA REPAIR      Social History   Socioeconomic History  . Marital status: Widowed    Spouse name: Bonnita Nasuti  . Number of children: 7  . Years of education: Not on file  . Highest education level: Not on file  Occupational History  . Occupation: Retired Building control surveyor, now doing Secretary/administrator  Social Needs  . Financial resource strain: Not on file  . Food insecurity    Worry: Not on file    Inability: Not on file  .  Transportation needs    Medical: Not on file    Non-medical: Not on file  Tobacco Use  . Smoking status: Former Smoker    Packs/day: 1.50    Years: 20.00    Pack years: 30.00    Types: Cigarettes    Quit date: 09/09/1997    Years since quitting: 21.7  . Smokeless tobacco: Never Used  Substance and Sexual Activity  . Alcohol use: Yes    Alcohol/week: 1.0 standard drinks    Types: 1 Cans of beer per week    Comment: half to a whole beer - occasionally (social)  . Drug use: No  . Sexual activity: Not on file  Lifestyle  . Physical activity    Days per week: Not on file    Minutes per session: Not on file  . Stress: Not on file  Relationships  . Social Herbalist on phone: Not on file    Gets together: Not on file    Attends religious service: Not on file    Active member of club or organization: Not on file    Attends meetings of clubs or organizations: Not on file    Relationship status: Not on file  . Intimate partner violence    Fear of  current or ex partner: Not on file    Emotionally abused: Not on file    Physically abused: Not on file    Forced sexual activity: Not on file  Other Topics Concern  . Not on file  Social History Narrative   Lives with wife at home    Review of Systems  Constitution: Negative for decreased appetite, malaise/fatigue, weight gain and weight loss.  Eyes: Negative for visual disturbance.  Cardiovascular: Positive for chest pain. Negative for claudication, dyspnea on exertion, leg swelling, orthopnea, palpitations and syncope.  Respiratory: Negative for hemoptysis and wheezing.   Endocrine: Negative for cold intolerance and heat intolerance.  Hematologic/Lymphatic: Does not bruise/bleed easily.  Skin: Negative for nail changes.  Musculoskeletal: Positive for joint pain. Negative for muscle weakness and myalgias.  Gastrointestinal: Negative for abdominal pain, change in bowel habit, nausea and vomiting.  Neurological: Negative  for difficulty with concentration, dizziness, focal weakness and headaches.  Psychiatric/Behavioral: Negative for altered mental status and suicidal ideas.  All other systems reviewed and are negative.     Objective:  Blood pressure (!) 159/93, pulse (!) 59, height 5\' 11"  (1.803 m), weight 187 lb 6.4 oz (85 kg), SpO2 97 %. Body mass index is 26.14 kg/m.    Physical Exam  Constitutional: He is oriented to person, place, and time. Vital signs are normal. He appears well-developed and well-nourished.  HENT:  Head: Normocephalic and atraumatic.  Neck: Normal range of motion.  Cardiovascular: Normal rate, regular rhythm, normal heart sounds and intact distal pulses.  Pulses:      Femoral pulses are 2+ on the right side with bruit and 2+ on the left side with bruit.      Popliteal pulses are 2+ on the right side and 2+ on the left side.       Dorsalis pedis pulses are 1+ on the right side and 1+ on the left side.       Posterior tibial pulses are 2+ on the right side and 2+ on the left side.  Pulmonary/Chest: Effort normal and breath sounds normal. No accessory muscle usage. No respiratory distress.  Abdominal: Soft. Bowel sounds are normal.  Musculoskeletal: Normal range of motion.  Neurological: He is alert and oriented to person, place, and time.  Skin: Skin is warm and dry.  Vitals reviewed.  Radiology: No results found.  Laboratory examination:    CMP Latest Ref Rng & Units 06/10/2018 11/04/2017 10/27/2017  Glucose 70 - 99 mg/dL 86 90 98  BUN 6 - 23 mg/dL 18 16 17   Creatinine 0.40 - 1.50 mg/dL 1.22 1.10 1.40(H)  Sodium 135 - 145 mEq/L 138 139 143  Potassium 3.5 - 5.1 mEq/L 4.3 4.0 4.3  Chloride 96 - 112 mEq/L 100 103 106  CO2 19 - 32 mEq/L 33(H) 30 32  Calcium 8.4 - 10.5 mg/dL 9.7 9.9 10.4(H)  Total Protein 6.0 - 8.3 g/dL 7.5 - 7.3  Total Bilirubin 0.2 - 1.2 mg/dL 0.7 - 0.8  Alkaline Phos 39 - 117 U/L 60 - -  AST 0 - 37 U/L 17 - 16  ALT 0 - 53 U/L 14 - 17   CBC Latest Ref  Rng & Units 06/10/2018 10/27/2017 06/03/2016  WBC 4.0 - 10.5 K/uL 3.7(L) 5.0 5.7  Hemoglobin 13.0 - 17.0 g/dL 13.9 14.1 14.1  Hematocrit 39.0 - 52.0 % 40.3 41.3 40.6  Platelets 150.0 - 400.0 K/uL 183.0 163 180.0   Lipid Panel     Component Value Date/Time  CHOL 163 06/10/2018 1300   TRIG 94.0 06/10/2018 1300   HDL 78.00 06/10/2018 1300   CHOLHDL 2 06/10/2018 1300   VLDL 18.8 06/10/2018 1300   LDLCALC 66 06/10/2018 1300   HEMOGLOBIN A1C Lab Results  Component Value Date   HGBA1C 5.9 06/03/2016   TSH No results for input(s): TSH in the last 8760 hours.  PRN Meds:. Medications Discontinued During This Encounter  Medication Reason  . lisinopril-hydrochlorothiazide (ZESTORETIC) 20-25 MG tablet Discontinued by provider   Current Meds  Medication Sig  . amLODipine (NORVASC) 10 MG tablet Take 1 tablet by mouth once daily  . aspirin 81 MG tablet Take 81 mg by mouth daily.  . Multiple Vitamin (DAILY-VITAMIN) TABS Take 1 tablet by mouth daily.   . [DISCONTINUED] lisinopril-hydrochlorothiazide (ZESTORETIC) 20-25 MG tablet Take 1 tablet by mouth daily.    Cardiac Studies:    Exercise Treadmill stress test 08/31/2018: Indication: CP The patient exercised on Bruce protocol for 7:45 min. Patient achieved 9.74 METS and reached HR 132 bpm, which is 88% of maximum age-predicted HR. Stress test terminated due to leg fatigue. Exercise capacity was normal. HR Response to Exercise: Appropriate. BP Response to Exercise: Resting hypertension with appropriate response. Chest Pain: None. Arrhythmias: none. Resting EKG demonstrates Normal sinus rhythm, baseline ST elevation in anteroseptal leads without Q waves, likely early repolarization. ST Changes: With peak exercise there was no ST-T changes of ischemia. Overall Impression: Normal stress test. Recommendations: Continue primary/secondary prevention.  Echocardiogram 05/27/2019: Normal LV systolic function with EF 61%. Mild concentric hypertrophy of the  left ventricle. Left ventricle cavity is normal in size. Normal global wall motion. Doppler evidence of grade I (impaired) diastolic dysfunction, normal LAP. Calculated EF 61%. Mild (Grade I) mitral regurgitation. Mild tricuspid regurgitation. No evidence of pulmonary hypertension. The aortic root measures within normal range, at 3.6 cm- measured 3.9 cm in 09/2018, which is also upper limit normal.   Assessment:   Aortic root dilation (HCC)  Primary hypertension  Atypical chest pain - Plan: EKG 12-Lead  EKG 06/01/2019: Sinus bradycardia 58 bpm, left atrial enlargement, normal axis, poor R wave progression, early repolarization abnormality likely normal variant.  No changes compared to EKG in October 2019.  Recommendations:   Previously noted mild aortic root dilation is now improved with aortic root measuring 3.6 cm. Would recommend repeating echocardiogram if clinically indicated. He continues to have atypical chest pain, but improved from before. No changes to EKG. Has had normal stress test. His blood pressure is elevated today, will change lisinopril HCT to Valsartan HCT to see if this will improve. Continue to follow low sodium diet. Will check BMP in 2 weeks for surveillance of kidney function. I will see him back in 8 weeks for follow up on HTN.  Miquel Dunn, MSN, APRN, FNP-C [facility]. [place] Office: [phone] Fax: [phone]

## 2019-07-06 ENCOUNTER — Other Ambulatory Visit: Payer: Self-pay

## 2019-07-06 ENCOUNTER — Encounter: Payer: Self-pay | Admitting: Family Medicine

## 2019-07-06 ENCOUNTER — Ambulatory Visit (INDEPENDENT_AMBULATORY_CARE_PROVIDER_SITE_OTHER): Payer: Medicare HMO | Admitting: Family Medicine

## 2019-07-06 VITALS — BP 146/70 | HR 64 | Temp 97.4°F | Ht 71.0 in | Wt 190.1 lb

## 2019-07-06 DIAGNOSIS — N529 Male erectile dysfunction, unspecified: Secondary | ICD-10-CM | POA: Diagnosis not present

## 2019-07-06 DIAGNOSIS — M25469 Effusion, unspecified knee: Secondary | ICD-10-CM

## 2019-07-06 MED ORDER — SILDENAFIL CITRATE 50 MG PO TABS: ORAL_TABLET | ORAL | 1 refills | Status: DC

## 2019-07-06 NOTE — Patient Instructions (Addendum)
Avoid squatting and working on your knees.  Use ise 5 minutes at a time on each knee.  Update me as needed.  Take care.  Glad to see you.

## 2019-07-06 NOTE — Progress Notes (Signed)
L knee swelling noted yesterday.  He had a "big knot" on the medial side of the knee yesterday.  No trauma, not painful.  He noted it incidentally.  No sx in the meantime.  Clearly improved this AM.    He has some R leg pain at baseline, chronically.  No fevers, no chills.  No bruising.  "It was about as big as 2 eggs."  He was still walking normally but the last week had been on his knees a lot, putting in a deck.    He needed a refill on sildenafil, it helped.  No ADE on med.  No CP.    Meds, vitals, and allergies reviewed.   ROS: Per HPI unless specifically indicated in ROS section   nad ncat rrr Left knee with normal inspection except for slight medial puffiness superior to the joint line.  No erythema.  No bruising.  Normal range of motion.  Patella not tender.  ACL, MCL, LCL stable on exam.  No crepitus on range of motion.  Popliteal area not tender. Right knee with mild crepitus on range of motion.  Normal inspection.  No bruising.  No erythema.  Popliteal area not tender.  ACL, MCL, LCL stable on exam. Able to bear weight.

## 2019-07-07 DIAGNOSIS — N529 Male erectile dysfunction, unspecified: Secondary | ICD-10-CM | POA: Insufficient documentation

## 2019-07-07 DIAGNOSIS — M25469 Effusion, unspecified knee: Secondary | ICD-10-CM | POA: Insufficient documentation

## 2019-07-07 NOTE — Assessment & Plan Note (Addendum)
He needed a refill on sildenafil, it helped.  No ADE on med.  No CP.   Refill done at office visit.

## 2019-07-07 NOTE — Assessment & Plan Note (Signed)
I think he has 2 separate issues. Likely bursitis L knee, better now.  Likely baker's cyst R knee, better now.  Anatomy and exam discussed with patient.  Reasonable to use ice on the knees for 5 minutes at a time, alternating.  Update [place] as needed.  He agrees.  No need for imaging at this point.

## 2019-07-27 ENCOUNTER — Ambulatory Visit: Payer: Medicare HMO | Admitting: Cardiology

## 2019-07-27 DIAGNOSIS — Z5329 Procedure and treatment not carried out because of patient's decision for other reasons: Secondary | ICD-10-CM

## 2019-08-02 ENCOUNTER — Encounter: Payer: Medicare HMO | Admitting: Internal Medicine

## 2019-08-02 NOTE — Progress Notes (Deleted)
HPI:  Pt presents to the clinic today for his subsequent annual Medicare Wellness Exam. He is also due to follow up chronic conditions.  HTN: His BP today is. He is taking Valsartan HCT and Amlodpine as prescribed. ECG from 05/2019 reviewed.  History of TIA: No residual effect. Managed on Valsartan HCT, Amlodipine, ASA. He does not follow with neurology.  ED: He has difficulty initiating and maintaining an erection. He takes Viagra as needed with some improvement.   Past Medical History:  Diagnosis Date  . Chicken pox   . Hypertension   . Right eye injury    pt has glass eye  . TIA (transient ischemic attack) 2017    Current Outpatient Medications  Medication Sig Dispense Refill  . amLODipine (NORVASC) 10 MG tablet Take 1 tablet by mouth once daily 90 tablet 0  . aspirin 81 MG tablet Take 81 mg by mouth daily.    . Multiple Vitamin (DAILY-VITAMIN) TABS Take 1 tablet by mouth daily.     . sildenafil (VIAGRA) 50 MG tablet TAKE 1/2 TO 1 (ONE-HALF TO ONE) TABLET BY MOUTH  DAILY AS NEEDED FOR ERECTILE DYSFUNCTION 10 tablet 1  . valsartan-hydrochlorothiazide (DIOVAN-HCT) 320-25 MG tablet Take 1 tablet by mouth daily. 30 tablet 2   No current facility-administered medications for this visit.     No Known Allergies  Family History  Problem Relation Age of Onset  . Hypertension Mother   . CVA Father   . Hypertension Sister   . Hypertension Sister   . Diabetes Brother   . Cancer Neg Hx   . Heart disease Neg Hx   . Stroke Neg Hx     Social History   Socioeconomic History  . Marital status: Widowed    Spouse name: Bonnita Nasuti  . Number of children: 7  . Years of education: Not on file  . Highest education level: Not on file  Occupational History  . Occupation: Retired Building control surveyor, now doing Secretary/administrator  Social Needs  . Financial resource strain: Not on file  . Food insecurity    Worry: Not on file    Inability: Not on file  . Transportation needs    Medical: Not on file   Non-medical: Not on file  Tobacco Use  . Smoking status: Former Smoker    Packs/day: 1.50    Years: 20.00    Pack years: 30.00    Types: Cigarettes    Quit date: 09/09/1997    Years since quitting: 21.9  . Smokeless tobacco: Never Used  Substance and Sexual Activity  . Alcohol use: Yes    Alcohol/week: 1.0 standard drinks    Types: 1 Cans of beer per week    Comment: half to a whole beer - occasionally (social)  . Drug use: No  . Sexual activity: Not on file  Lifestyle  . Physical activity    Days per week: Not on file    Minutes per session: Not on file  . Stress: Not on file  Relationships  . Social Herbalist on phone: Not on file    Gets together: Not on file    Attends religious service: Not on file    Active member of club or organization: Not on file    Attends meetings of clubs or organizations: Not on file    Relationship status: Not on file  . Intimate partner violence    Fear of current or ex partner: Not on file    Emotionally  abused: Not on file    Physically abused: Not on file    Forced sexual activity: Not on file  Other Topics Concern  . Not on file  Social History Narrative   Lives with wife at home    Hospitiliaztions: None  Health Maintenance:    Flu: 05/2016  Tetanus: 10/2013  Pneumovax: 10/2011  Prevnar: never  Zostavax: never  Shingrix: never  PSA: 05/2016  Colon Screening: 10/2011  Eye Doctor:  Dental Exam:   Providers:   PCP: Webb Silversmith, NP   Cardiologist: Hilbert Corrigan   I have personally reviewed and have noted:  1. The patient's medical and social history 2. Their use of alcohol, tobacco or illicit drugs 3. Their current medications and supplements 4. The patient's functional ability including ADL's, fall risks, home safety risks and hearing or visual impairment. 5. Diet and physical activities 6. Evidence for depression or mood disorder  Subjective:   Review of Systems:   Constitutional: Denies fever, malaise,  fatigue, headache or abrupt weight changes.  HEENT: Denies eye pain, eye redness, ear pain, ringing in the ears, wax buildup, runny nose, nasal congestion, bloody nose, or sore throat. Respiratory: Denies difficulty breathing, shortness of breath, cough or sputum production.   Cardiovascular: Denies chest pain, chest tightness, palpitations or swelling in the hands or feet.  Gastrointestinal: Denies abdominal pain, bloating, constipation, diarrhea or blood in the stool.  GU: Denies urgency, frequency, pain with urination, burning sensation, blood in urine, odor or discharge. Musculoskeletal: Denies decrease in range of motion, difficulty with gait, muscle pain or joint pain and swelling.  Skin: Denies redness, rashes, lesions or ulcercations.  Neurological: Denies dizziness, difficulty with memory, difficulty with speech or problems with balance and coordination.  Psych: Denies anxiety, depression, SI/HI.  No other specific complaints in a complete review of systems (except as listed in HPI above).  Objective:  PE:   There were no vitals taken for this visit. Wt Readings from Last 3 Encounters:  07/06/19 190 lb 1 oz (86.2 kg)  06/01/19 187 lb 6.4 oz (85 kg)  04/06/19 185 lb 12.8 oz (84.3 kg)    General: Appears their stated age, well developed, well nourished in NAD. Skin: Warm, dry and intact. No rashes, lesions or ulcerations noted. HEENT: Head: normal shape and size; Eyes: sclera white, no icterus, conjunctiva pink, PERRLA and EOMs intact; Ears: Tm's gray and intact, normal light reflex; Throat/Mouth: Teeth present, mucosa pink and moist, no exudate, lesions or ulcerations noted.  Neck: Neck supple, trachea midline. No masses, lumps or thyromegaly present.  Cardiovascular: Normal rate and rhythm. S1,S2 noted.  No murmur, rubs or gallops noted. No JVD or BLE edema. No carotid bruits noted. Pulmonary/Chest: Normal effort and positive vesicular breath sounds. No respiratory distress. No  wheezes, rales or ronchi noted.  Abdomen: Soft and nontender. Normal bowel sounds. No distention or masses noted. Liver, spleen and kidneys non palpable. Musculoskeletal: Normal range of motion. Strength 5/5 BUE/BLE. No signs of joint swelling.  Neurological: Alert and oriented. Cranial nerves II-XII grossly intact. Coordination normal.  Psychiatric: Mood and affect normal. Behavior is normal. Judgment and thought content normal.   EKG:  BMET    Component Value Date/Time   NA 138 06/10/2018 1300   K 4.3 06/10/2018 1300   CL 100 06/10/2018 1300   CO2 33 (H) 06/10/2018 1300   GLUCOSE 86 06/10/2018 1300   BUN 18 06/10/2018 1300   CREATININE 1.22 06/10/2018 1300   CREATININE 1.40 (H) 10/27/2017  1620   CALCIUM 9.7 06/10/2018 1300   GFRNONAA >60 12/27/2015 1755   GFRAA >60 12/27/2015 1755    Lipid Panel     Component Value Date/Time   CHOL 163 06/10/2018 1300   TRIG 94.0 06/10/2018 1300   HDL 78.00 06/10/2018 1300   CHOLHDL 2 06/10/2018 1300   VLDL 18.8 06/10/2018 1300   LDLCALC 66 06/10/2018 1300    CBC    Component Value Date/Time   WBC 3.7 (L) 06/10/2018 1300   RBC 4.19 (L) 06/10/2018 1300   HGB 13.9 06/10/2018 1300   HCT 40.3 06/10/2018 1300   PLT 183.0 06/10/2018 1300   MCV 96.2 06/10/2018 1300   MCV 95.1 09/17/2014 1110   MCH 32.1 10/27/2017 1620   MCHC 34.4 06/10/2018 1300   RDW 12.8 06/10/2018 1300   LYMPHSABS 1.9 06/10/2018 1300   MONOABS 0.6 06/10/2018 1300   EOSABS 0.1 06/10/2018 1300   BASOSABS 0.0 06/10/2018 1300    Hgb A1C Lab Results  Component Value Date   HGBA1C 5.9 06/03/2016      Assessment and Plan:   Medicare Annual Wellness Visit:  Diet:  Physical activity:  Depression/mood screen: Negative Hearing: Intact to whispered voice Visual acuity: Grossly normal, performs annual eye exam  ADLs: Capable Fall risk: None Home safety: Good Cognitive evaluation: Intact to orientation, naming, recall and repetition EOL planning: Adv  directives, full code/ I agree  Preventative Medicine:   Next appointment:   Webb Silversmith, NP

## 2020-01-06 ENCOUNTER — Other Ambulatory Visit: Payer: Self-pay | Admitting: Cardiology

## 2020-01-18 ENCOUNTER — Telehealth: Payer: Self-pay | Admitting: Internal Medicine

## 2020-01-18 NOTE — Telephone Encounter (Signed)
Spoke with Terry Russo and was advised that everything was taking care of and nothing further is needed. Patient picked up his medication

## 2020-01-18 NOTE — Telephone Encounter (Signed)
Called [facility] and spoke with the pharmacist. They have the right DOB for the patient on file and stated that patient picked up his b/p medication earlier today.  I called [facility] and left a message to call back. I am not sure what the issue is at this time with patient's medication. Waiting to hear back from [place] or patient to get more information

## 2020-01-18 NOTE — Telephone Encounter (Signed)
Patient's Daughter Sara Chu called She stated that the patient has been trying to pick up his prescriptions from [facility] They stated that his birthday is incorrect and in order to have it fixed they needed to speak with [place]. I verified Patient DOB and the one we have on file is correct. Asked Sara Chu if the pharmacy is the one with the incorrect DOB and she stated yes., I asked if she has spoken with the pharmacy to find out of this is something they can correct since it is incorrect on their end and not ours. She stated the pharmacy said it was something she would need to talk to [place] about and get fixed???   Can you help with this?

## 2020-02-08 ENCOUNTER — Encounter: Payer: Medicare HMO | Admitting: Internal Medicine

## 2020-02-09 ENCOUNTER — Encounter (HOSPITAL_COMMUNITY): Payer: Self-pay | Admitting: Emergency Medicine

## 2020-02-09 ENCOUNTER — Telehealth: Payer: Self-pay

## 2020-02-09 ENCOUNTER — Other Ambulatory Visit: Payer: Self-pay

## 2020-02-09 ENCOUNTER — Emergency Department (HOSPITAL_COMMUNITY): Payer: Medicare HMO

## 2020-02-09 ENCOUNTER — Emergency Department (HOSPITAL_COMMUNITY)
Admission: EM | Admit: 2020-02-09 | Discharge: 2020-02-09 | Disposition: A | Discharge status: Home / Self Care | Payer: Medicare HMO | Attending: Emergency Medicine | Admitting: Emergency Medicine

## 2020-02-09 DIAGNOSIS — Z79899 Other long term (current) drug therapy: Secondary | ICD-10-CM | POA: Diagnosis not present

## 2020-02-09 DIAGNOSIS — R0789 Other chest pain: Secondary | ICD-10-CM | POA: Insufficient documentation

## 2020-02-09 DIAGNOSIS — I10 Essential (primary) hypertension: Secondary | ICD-10-CM | POA: Diagnosis not present

## 2020-02-09 DIAGNOSIS — Z87891 Personal history of nicotine dependence: Secondary | ICD-10-CM | POA: Insufficient documentation

## 2020-02-09 DIAGNOSIS — R079 Chest pain, unspecified: Secondary | ICD-10-CM | POA: Diagnosis not present

## 2020-02-09 DIAGNOSIS — Z89022 Acquired absence of left finger(s): Secondary | ICD-10-CM | POA: Diagnosis not present

## 2020-02-09 DIAGNOSIS — Z7982 Long term (current) use of aspirin: Secondary | ICD-10-CM | POA: Diagnosis not present

## 2020-02-09 LAB — BASIC METABOLIC PANEL
Anion gap: 9 (ref 5–15)
BUN: 15 mg/dL (ref 8–23)
CO2: 26 mmol/L (ref 22–32)
Calcium: 9.8 mg/dL (ref 8.9–10.3)
Chloride: 108 mmol/L (ref 98–111)
Creatinine, Ser: 1.2 mg/dL (ref 0.61–1.24)
GFR calc Af Amer: >60 mL/min (ref >60)
GFR calc non Af Amer: >60 mL/min (ref >60)
Glucose, Bld: 112 mg/dL — ABNORMAL HIGH (ref 70–99)
Potassium: 3.9 mmol/L (ref 3.5–5.1)
Sodium: 143 mmol/L (ref 135–145)

## 2020-02-09 LAB — CBC
HCT: 39.1 % (ref 39.0–52.0)
Hemoglobin: 13 g/dL (ref 13.0–17.0)
MCH: 33 pg (ref 26.0–34.0)
MCHC: 33.2 g/dL (ref 30.0–36.0)
MCV: 99.2 fL (ref 80.0–100.0)
Platelets: 168 10*3/uL (ref 150–400)
RBC: 3.94 MIL/uL — ABNORMAL LOW (ref 4.22–5.81)
RDW: 11.9 % (ref 11.5–15.5)
WBC: 5.4 10*3/uL (ref 4.0–10.5)
nRBC: 0 % (ref 0.0–0.2)

## 2020-02-09 LAB — TROPONIN I (HIGH SENSITIVITY)
Troponin I (High Sensitivity): 9 ng/L (ref <18)
Troponin I (High Sensitivity): 9 ng/L (ref <18)

## 2020-02-09 MED ORDER — ASPIRIN 81 MG PO CHEW
324.0000 mg | CHEWABLE_TABLET | Freq: Once | ORAL | Status: AC
  Administered 2020-02-09: 324 mg via ORAL
  Filled 2020-02-09: qty 4

## 2020-02-09 MED ORDER — SODIUM CHLORIDE 0.9% FLUSH: 3.0000 mL | Freq: Once | INTRAVENOUS | Status: DC

## 2020-02-09 NOTE — Telephone Encounter (Signed)
Terry Russo CMA said pt was scheduled on 02/10/20 for 15' appt with Avie Echevaria NP for CP. Pt needs triage and to change appt or pt to go to [facility] or ED. Left v/m for pt to cb on both contact #s.

## 2020-02-09 NOTE — Telephone Encounter (Signed)
noted 

## 2020-02-09 NOTE — ED Provider Notes (Signed)
[facility] EMERGENCY DEPARTMENT Provider Note   CSN: [id number] Arrival date & time: 02/09/20  1150     History Chief Complaint  Patient presents with   Chest Pain    Terry Russo is a 74 y.o. male.  HPI 74 year old male presents with chest pain.  Started this morning around 7 or 7:30 AM.  On and off for a couple hours but now it seems to be better.  No shortness of breath, radiation of the pain, diaphoresis, or vomiting with it.  Hard to describe with the pain felt like, may be a pressure.  Was a little right of midline.  No back pain.  Currently no pain.   Past Medical History:  Diagnosis Date   Chicken pox    Hypertension    Right eye injury    pt has glass eye   TIA (transient ischemic attack) 2017    Patient Active Problem List   Diagnosis Date Noted   Erectile dysfunction 07/07/2019   TIA (transient ischemic attack) 06/03/2016   HTN (hypertension) 10/18/2011    Past Surgical History:  Procedure Laterality Date   ABDOMINAL SURGERY     AMPUTATION FINGER / THUMB     left   COLONOSCOPY  10/31/2011   Procedure: COLONOSCOPY;  Surgeon: Owens Loffler, MD;  Location: [facility];  Service: Endoscopy;  Laterality: N/A;   ENUCLEATION     glass eye   HERNIA REPAIR         Family History  Problem Relation Age of Onset   Hypertension Mother    CVA Father    Hypertension Sister    Hypertension Sister    Diabetes Brother    Cancer Neg Hx    Heart disease Neg Hx    Stroke Neg Hx     Social History   Tobacco Use   Smoking status: Former Smoker    Packs/day: 1.50    Years: 20.00    Pack years: 30.00    Types: Cigarettes    Quit date: 09/09/1997    Years since quitting: 22.4   Smokeless tobacco: Never Used  Substance Use Topics   Alcohol use: Yes    Alcohol/week: 1.0 standard drinks    Types: 1 Cans of beer per week    Comment: half to a whole beer - occasionally (social)   Drug use: No    Home  Medications Prior to Admission medications   Medication Sig Start Date End Date Taking? Authorizing Provider  amLODipine (NORVASC) 10 MG tablet Take 1 tablet by mouth once daily 12/01/18   Jearld Fenton, NP  aspirin 81 MG tablet Take 81 mg by mouth daily.    [provider]  Multiple Vitamin (DAILY-VITAMIN) TABS Take 1 tablet by mouth daily.     [provider]  sildenafil (VIAGRA) 50 MG tablet TAKE 1/2 TO 1 (ONE-HALF TO ONE) TABLET BY MOUTH  DAILY AS NEEDED FOR ERECTILE DYSFUNCTION 07/06/19   Tonia Ghent, MD  valsartan-hydrochlorothiazide (DIOVAN-HCT) 320-25 MG tablet TAKE 1 TABLET BY MOUTH EVERY DAY 01/06/20   Miquel Dunn, NP    Allergies    Patient has no known allergies.  Review of Systems   Review of Systems  Constitutional: Negative for diaphoresis.  Respiratory: Negative for shortness of breath.   Cardiovascular: Positive for chest pain. Negative for leg swelling.  Gastrointestinal: Negative for abdominal pain and vomiting.  Musculoskeletal: Negative for back pain.  All other systems reviewed and are negative.  Physical Exam Updated Vital Signs BP (!) 169/84 (BP Location: Right Arm)    Pulse (!) 52    Temp 98.7 F (37.1 C) (Oral)    Resp 14    SpO2 99%   Physical Exam Vitals and nursing note reviewed.  Constitutional:      General: He is not in acute distress.    Appearance: He is well-developed. He is not ill-appearing or diaphoretic.  HENT:     Head: Normocephalic and atraumatic.     Right Ear: External ear normal.     Left Ear: External ear normal.     Nose: Nose normal.  Eyes:     General:        Right eye: No discharge.        Left eye: No discharge.  Cardiovascular:     Rate and Rhythm: Normal rate and regular rhythm.     Pulses:          Radial pulses are 2+ on the right side and 2+ on the left side.     Heart sounds: Normal heart sounds.  Pulmonary:     Effort: Pulmonary effort is normal.     Breath sounds: Normal  breath sounds.  Chest:     Chest wall: No tenderness.  Abdominal:     Palpations: Abdomen is soft.     Tenderness: There is no abdominal tenderness.  Musculoskeletal:     Cervical back: Neck supple.  Skin:    General: Skin is warm and dry.  Neurological:     Mental Status: He is alert.  Psychiatric:        Mood and Affect: Mood is not anxious.     ED Results / Procedures / Treatments   Labs (all labs ordered are listed, but only abnormal results are displayed) Labs Reviewed  BASIC METABOLIC PANEL - Abnormal; Notable for the following components:      Result Value   Glucose, Bld 112 (*)    All other components within normal limits  CBC - Abnormal; Notable for the following components:   RBC 3.94 (*)    All other components within normal limits  TROPONIN I (HIGH SENSITIVITY)  TROPONIN I (HIGH SENSITIVITY)    EKG EKG Interpretation  Date/Time:  Wednesday February 09 2020 13:55:43 EDT Ventricular Rate:  49 PR Interval:  152 QRS Duration: 103 QT Interval:  470 QTC Calculation: 425 R Axis:   39 Text Interpretation: Sinus bradycardia Probable left atrial enlargement Left ventricular hypertrophy ST changes similar to earlier in the day  and 2017 Confirmed by Sherwood Gambler [phone]) on 02/09/2020 1:59:13 PM   Radiology DG Chest 2 View  Result Date: 02/09/2020 CLINICAL DATA:  Chest pain. EXAM: CHEST - 2 VIEW COMPARISON:  12/27/2015 FINDINGS: In the cardiac silhouette, mediastinal and hilar contours are within normal limits. The lungs are clear. No pleural effusion or pulmonary lesions. The bony thorax is intact. IMPRESSION: No acute cardiopulmonary findings. Electronically Signed   By: Marijo Sanes M.D.   On: 02/09/2020 13:13    Procedures Procedures (including critical care time)  Medications Ordered in ED Medications  sodium chloride flush (NS) 0.9 % injection 3 mL (3 mLs Intravenous Not Given 02/09/20 1541)  aspirin chewable tablet 324 mg (324 mg Oral Given 02/09/20 1451)     ED Course  I have reviewed the triage vital signs and the nursing notes.  Pertinent labs & imaging results that were available during my care of the patient were reviewed by me  and considered in my medical decision making (see chart for details).    MDM Rules/Calculators/A&P                      Patient's chest pain is pretty atypical.  No alarming factors such as radiation.  ECG reviewed and does show LVH with some anterior ST elevations.  I discussed these with Dr. Angelena Form, who agrees this is likely LVH.  Given he has 2 negative troponins, it seems unlikely to be ACS.  He is having no symptoms now.  Highly doubt STEMI.  We discussed that while there is no MI today, he needs to return if his symptoms recur or worsen and otherwise follow-up with cardiology. Final Clinical Impression(s) / ED Diagnoses Final diagnoses:  Nonspecific chest pain    Rx / DC Orders ED Discharge Orders    None       Sherwood Gambler, MD 02/09/20 1627

## 2020-02-09 NOTE — ED Notes (Signed)
Patient verbalizes understanding of discharge instructions. Opportunity for questioning and answers were provided. Armband removed by staff, pt discharged from ED.  

## 2020-02-09 NOTE — ED Notes (Signed)
Got patient into a gown did a repeat ekg shown to Dr Regenia Skeeter patient is resting with call bell in reach and family at bedside walked patient to the bathroom patient did well

## 2020-02-09 NOTE — ED Triage Notes (Signed)
Pt here for eval of centralized chest pain radiating to the upper back /neck that started this morning, feels better now. No associated symptoms.

## 2020-02-09 NOTE — Telephone Encounter (Signed)
I tried to contact pt and no answer but per chart review tab pt is at [facility] ED.

## 2020-02-09 NOTE — ED Notes (Signed)
Notified EDP of elevated bp. Pt asymptomatic. EDP wants him to follow up with provider. Wife is reaching out to schedule appt at this time.

## 2020-02-09 NOTE — Discharge Instructions (Addendum)
If you develop recurrent, continued, or worsening chest pain, shortness of breath, fever, vomiting, abdominal or back pain, or any other new/concerning symptoms then return to the ER for evaluation.  

## 2020-02-10 ENCOUNTER — Ambulatory Visit: Payer: Medicare HMO | Admitting: Internal Medicine

## 2020-02-14 ENCOUNTER — Ambulatory Visit (INDEPENDENT_AMBULATORY_CARE_PROVIDER_SITE_OTHER): Payer: Medicare HMO | Admitting: Internal Medicine

## 2020-02-14 ENCOUNTER — Encounter: Payer: Self-pay | Admitting: Internal Medicine

## 2020-02-14 ENCOUNTER — Other Ambulatory Visit: Payer: Self-pay

## 2020-02-14 VITALS — BP 162/92 | HR 64 | Temp 98.2°F | Wt 193.0 lb

## 2020-02-14 DIAGNOSIS — R0789 Other chest pain: Secondary | ICD-10-CM | POA: Diagnosis not present

## 2020-02-14 DIAGNOSIS — I10 Essential (primary) hypertension: Secondary | ICD-10-CM

## 2020-02-14 MED ORDER — VALSARTAN-HYDROCHLOROTHIAZIDE 320-25 MG PO TABS: 1.0000 | ORAL_TABLET | Freq: Every day | ORAL | 0 refills | Status: DC

## 2020-02-14 MED ORDER — AMLODIPINE BESYLATE 10 MG PO TABS: 10.0000 mg | ORAL_TABLET | Freq: Every day | ORAL | 0 refills | Status: DC

## 2020-02-14 NOTE — Patient Instructions (Signed)

## 2020-02-14 NOTE — Progress Notes (Signed)
Subjective:    Patient ID: Terry Russo, male    DOB: Apr 23, 1946, 74 y.o.   MRN: [id number]  HPI  Patient presents to the clinic today for ER follow-up.  He went to the ER 6/2 with complaints of chest pain.  ECG showed some left atrial enlargement with mild ST elevation but troponins were negative.  Chest x-ray was unremarkable.  His chest pain had actually resolved by the time he had gotten to the ER.  He was given a dose of aspirin and advised to follow-up with cardiology as an outpatient.  Since discharge, he reports he has not had any chest pain. He has an appt with cardiology in the next few weeks. His BP today is 162/92. He reports he is not taking Valsartan HCT but he is taking Amlodipine.  Review of Systems  Past Medical History:  Diagnosis Date  . Chicken pox   . Hypertension   . Right eye injury    pt has glass eye  . TIA (transient ischemic attack) 2017    Current Outpatient Medications  Medication Sig Dispense Refill  . amLODipine (NORVASC) 10 MG tablet Take 1 tablet by mouth once daily 90 tablet 0  . aspirin 81 MG tablet Take 81 mg by mouth daily.    . Multiple Vitamin (DAILY-VITAMIN) TABS Take 1 tablet by mouth daily.     . sildenafil (VIAGRA) 50 MG tablet TAKE 1/2 TO 1 (ONE-HALF TO ONE) TABLET BY MOUTH  DAILY AS NEEDED FOR ERECTILE DYSFUNCTION 10 tablet 1  . valsartan-hydrochlorothiazide (DIOVAN-HCT) 320-25 MG tablet TAKE 1 TABLET BY MOUTH EVERY DAY 30 tablet 2   No current facility-administered medications for this visit.    No Known Allergies  Family History  Problem Relation Age of Onset  . Hypertension Mother   . CVA Father   . Hypertension Sister   . Hypertension Sister   . Diabetes Brother   . Cancer Neg Hx   . Heart disease Neg Hx   . Stroke Neg Hx     Social History   Socioeconomic History  . Marital status: Widowed    Spouse name: Terry Russo  . Number of children: 7  . Years of education: Not on file  . Highest education level: Not on file    Occupational History  . Occupation: Retired Building control surveyor, now doing Secretary/administrator  Tobacco Use  . Smoking status: Former Smoker    Packs/day: 1.50    Years: 20.00    Pack years: 30.00    Types: Cigarettes    Quit date: 09/09/1997    Years since quitting: 22.4  . Smokeless tobacco: Never Used  Substance and Sexual Activity  . Alcohol use: Yes    Alcohol/week: 1.0 standard drinks    Types: 1 Cans of beer per week    Comment: half to a whole beer - occasionally (social)  . Drug use: No  . Sexual activity: Not on file  Other Topics Concern  . Not on file  Social History Narrative   Lives with wife at home   Social Determinants of Health   Financial Resource Strain:   . Difficulty of Paying Living Expenses:   Food Insecurity:   . Worried About Charity fundraiser in the Last Year:   . Arboriculturist in the Last Year:   Transportation Needs:   . Film/video editor (Medical):   Marland Kitchen Lack of Transportation (Non-Medical):   Physical Activity:   . Days of Exercise per  Week:   . Minutes of Exercise per Session:   Stress:   . Feeling of Stress :   Social Connections:   . Frequency of Communication with Friends and Family:   . Frequency of Social Gatherings with Friends and Family:   . Attends Religious Services:   . Active Member of Clubs or Organizations:   . Attends Archivist Meetings:   Marland Kitchen Marital Status:   Intimate Partner Violence:   . Fear of Current or Ex-Partner:   . Emotionally Abused:   Marland Kitchen Physically Abused:   . Sexually Abused:      Constitutional: Pt reports intermittent headaches. Denies fever, malaise, fatigue, headache or abrupt weight changes.  Respiratory: Denies difficulty breathing, shortness of breath, cough or sputum production.   Cardiovascular: Denies chest pain, chest tightness, palpitations or swelling in the hands or feet.  Gastrointestinal: Denies abdominal pain, bloating, constipation, diarrhea or blood in the stool.  Neurological:  Denies dizziness, difficulty with memory, difficulty with speech or problems with balance and coordination.  Psych: Denies anxiety, depression, SI/HI.  No other specific complaints in a complete review of systems (except as listed in HPI above).     Objective:   Physical Exam  BP (!) 162/92   Pulse 64   Temp 98.2 F (36.8 C) (Temporal)   Wt 193 lb (87.5 kg)   SpO2 98%   BMI 26.92 kg/m   Wt Readings from Last 3 Encounters:  07/06/19 190 lb 1 oz (86.2 kg)  06/01/19 187 lb 6.4 oz (85 kg)  04/06/19 185 lb 12.8 oz (84.3 kg)    General: Appears his stated age, well developed, well nourished in NAD. Cardiovascular: Normal rate and rhythm. S1,S2 noted.  No murmur, rubs or gallops noted. No JVD or BLE edema. N Pulmonary/Chest: Normal effort and positive vesicular breath sounds. No respiratory distress. No wheezes, rales or ronchi noted.  Neurological: Alert and oriented.    BMET    Component Value Date/Time   NA 143 02/09/2020 1214   K 3.9 02/09/2020 1214   CL 108 02/09/2020 1214   CO2 26 02/09/2020 1214   GLUCOSE 112 (H) 02/09/2020 1214   BUN 15 02/09/2020 1214   CREATININE 1.20 02/09/2020 1214   CREATININE 1.40 (H) 10/27/2017 1620   CALCIUM 9.8 02/09/2020 1214   GFRNONAA >60 02/09/2020 1214   GFRAA >60 02/09/2020 1214    Lipid Panel     Component Value Date/Time   CHOL 163 06/10/2018 1300   TRIG 94.0 06/10/2018 1300   HDL 78.00 06/10/2018 1300   CHOLHDL 2 06/10/2018 1300   VLDL 18.8 06/10/2018 1300   LDLCALC 66 06/10/2018 1300    CBC    Component Value Date/Time   WBC 5.4 02/09/2020 1214   RBC 3.94 (L) 02/09/2020 1214   HGB 13.0 02/09/2020 1214   HCT 39.1 02/09/2020 1214   PLT 168 02/09/2020 1214   MCV 99.2 02/09/2020 1214   MCV 95.1 09/17/2014 1110   MCH 33.0 02/09/2020 1214   MCHC 33.2 02/09/2020 1214   RDW 11.9 02/09/2020 1214   LYMPHSABS 1.9 06/10/2018 1300   MONOABS 0.6 06/10/2018 1300   EOSABS 0.1 06/10/2018 1300   BASOSABS 0.0 06/10/2018 1300      Hgb A1C Lab Results  Component Value Date   HGBA1C 5.9 06/03/2016           Assessment & Plan:   ER follow-up for Chest Pain:  ER notes, labs and imaging reviewed No indication for additional blood  work at this time Refilled Amlodipine and Valsartan HCT- discussed importance of compliance with this He already has an appt with cardiology scheduled  Return precautions discussed Webb Silversmith, NP This visit occurred during the SARS-CoV-2 public health emergency.  Safety protocols were in place, including screening questions prior to the visit, additional usage of staff PPE, and extensive cleaning of exam room while observing appropriate contact time as indicated for disinfecting solutions.

## 2020-02-29 ENCOUNTER — Encounter: Payer: Medicare HMO | Admitting: Internal Medicine

## 2020-02-29 NOTE — Progress Notes (Signed)
CARDIOLOGY OFFICE NOTE  Date:  03/08/2020    Terry Russo Date of Birth: 1945-09-14 Medical Record #[id number]  PCP:  Jearld Fenton, NP  Cardiologist:  [facility]  Chief Complaint  Patient presents with  . Hospitalization Follow-up    Seen for Dr. Angelena Form    History of Present Illness: Terry Russo is a 74 y.o. male who presents today for a post ER visit. Seen for Dr. Angelena Form.   He has a history of HTN, prior TIA in 2017, over 30 pack year history of smoking, mild aortic root dilatation and atypical chest pain.   Last seen by Dr. Angelena Form in January of 2020 - occasional chest pain that improved with belching but overall felt to be stable.   Noted that this patient was seen at [facility] in September of 2020.  He was in the ER earlier this month with chest pain - negative evaluation. EKG unchanged - has LVH on EKG.   The patient does not have symptoms concerning for COVID-19 infection (fever, chills, cough, or new shortness of breath).   Comes in today. Here with his wife. She augments his history. He is feeling better. His chest pain was on the right side of his chest - he thought it was gas - lasted for a few minutes - by the time he got to the ER it was gone. He tried to rub it - that did not help. Has come back - but not as severe. He can rub his chest and it can go away. He has some headaches if he does not eat. Not short of breath. He walks every day - feels good with that. Probably walks 2 miles without any issue. He also rides his bicycle too - no problem. Then tells me that he stopped all that while his chest was hurting - really unclear duration of how long this has gone on. Seeing PCP in July with labs. Also asking about how to get a dentist that takes his insurance.   Past Medical History:  Diagnosis Date  . Chicken pox   . Hypertension   . Right eye injury    pt has glass eye  . TIA (transient ischemic attack) 2017    Past Surgical  History:  Procedure Laterality Date  . ABDOMINAL SURGERY    . AMPUTATION FINGER / THUMB     left  . COLONOSCOPY  10/31/2011   Procedure: COLONOSCOPY;  Surgeon: Owens Loffler, MD;  Location: [facility];  Service: Endoscopy;  Laterality: N/A;  . ENUCLEATION     glass eye  . HERNIA REPAIR       Medications: Current Meds  Medication Sig  . amLODipine (NORVASC) 10 MG tablet Take 1 tablet (10 mg total) by mouth daily.  Marland Kitchen aspirin 81 MG tablet Take 81 mg by mouth daily.  . Multiple Vitamin (DAILY-VITAMIN) TABS Take 1 tablet by mouth daily.   . sildenafil (VIAGRA) 50 MG tablet TAKE 1/2 TO 1 (ONE-HALF TO ONE) TABLET BY MOUTH  DAILY AS NEEDED FOR ERECTILE DYSFUNCTION  . valsartan-hydrochlorothiazide (DIOVAN-HCT) 320-25 MG tablet Take 1 tablet by mouth daily.     Allergies: No Known Allergies  Social History: The patient  reports that he quit smoking about 22 years ago. His smoking use included cigarettes. He has a 30.00 pack-year smoking history. He has never used smokeless tobacco. He reports current alcohol use of about 1.0 standard drink of alcohol per week. He reports that he does not  use drugs.   Family History: The patient's family history includes CVA in his father; Diabetes in his brother; Hypertension in his mother, sister, and sister.   Review of Systems: Please see the history of present illness.   All other systems are reviewed and negative.   Physical Exam: VS:  BP 138/80   Pulse 65   Ht 5\' 11"  (1.803 m)   Wt 195 lb (88.5 kg)   SpO2 98%   BMI 27.20 kg/m  .  BMI Body mass index is 27.2 kg/m.  Wt Readings from Last 3 Encounters:  03/08/20 195 lb (88.5 kg)  02/14/20 193 lb (87.5 kg)  07/06/19 190 lb 1 oz (86.2 kg)    General: Alert and in no acute distress.   Cardiac: Regular rate and rhythm. No murmurs, rubs, or gallops. No edema.  Respiratory:  Lungs are clear to auscultation bilaterally with normal work of breathing.  GI: Soft and nontender.  MS: No  deformity or atrophy. Gait and ROM intact.  Skin: Warm and dry. Color is normal.  Neuro:  Strength and sensation are intact and no gross focal deficits noted. 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  Psych: Alert, appropriate and with normal affect.   LABORATORY DATA:  EKG:  EKG is not ordered today.    Lab Results  Component Value Date   WBC 5.4 02/09/2020   HGB 13.0 02/09/2020   HCT 39.1 02/09/2020   PLT 168 02/09/2020   GLUCOSE 112 (H) 02/09/2020   CHOL 163 06/10/2018   TRIG 94.0 06/10/2018   HDL 78.00 06/10/2018   LDLCALC 66 06/10/2018   ALT 14 06/10/2018   AST 17 06/10/2018   NA 143 02/09/2020   K 3.9 02/09/2020   CL 108 02/09/2020   CREATININE 1.20 02/09/2020   BUN 15 02/09/2020   CO2 26 02/09/2020   TSH 0.76 11/01/2013   PSA 2.61 06/03/2016   HGBA1C 5.9 06/03/2016     BNP (last 3 results) No results for input(s): BNP in the last 8760 hours.  ProBNP (last 3 results) No results for input(s): PROBNP in the last 8760 hours.   Other Studies Reviewed Today:  Exercise Treadmill stress test 08/31/2018: Indication: CP The patient exercised on Bruce protocol for 7:45 min. Patient achieved 9.74 METS and reached HR 132 bpm, which is 88% of maximum age-predicted HR. Stress test terminated due to leg fatigue. Exercise capacity was normal. HR Response to Exercise: Appropriate. BP Response to Exercise: Resting hypertension with appropriate response. Chest Pain: None. Arrhythmias: none. Resting EKG demonstrates Normal sinus rhythm, baseline ST elevation in anteroseptal leads without Q waves, likely early repolarization. ST Changes: With peak exercise there was no ST-T changes of ischemia. Overall Impression: Normal stress test. Recommendations: Continue primary/secondary prevention.  Echocardiogram 05/27/2019: Normal LV systolic function with EF 61%. Mild concentric hypertrophy of the left ventricle. Left ventricle cavity is normal in size. Normal global wall  motion. Doppler evidence of grade I (impaired) diastolic dysfunction, normal LAP. Calculated EF 61%. Mild (Grade I) mitral regurgitation. Mild tricuspid regurgitation. No evidence of pulmonary hypertension. The aortic root measures within normal range, at 3.6 cm- measured 3.9 cm in 09/2018, which is also upper limit normal.   Assessment & PLAN:   1. Recent ER visit for chest pain - will update his GXT.   2. Prior aortic root dilatation -  this measured normal on his echo from September at [facility]. Has good BP control.   3. HTN -  BP is good here - would stay on DiovanHct and Norvasc.  4. HLD - for upcoming labs   Current medicines are reviewed with the patient today.  The patient does not have concerns regarding medicines other than what has been noted above.  The following changes have been made:  See above.  Labs/ tests ordered today include:    Orders Placed This Encounter  Procedures  . EXERCISE TOLERANCE TEST (ETT)     Disposition:   FU with [place] in 6 months.    Patient is agreeable to this plan and will call if any problems develop in the interim.   SignedTruitt Merle, NP  03/08/2020 9:08 AM  [facility] [address] [place], [place]  [address] Phone: [phone] Fax: [phone]

## 2020-02-29 NOTE — Progress Notes (Deleted)
HPI:  Past Medical History:  Diagnosis Date  . Chicken pox   . Hypertension   . Right eye injury    pt has glass eye  . TIA (transient ischemic attack) 2017    Current Outpatient Medications  Medication Sig Dispense Refill  . amLODipine (NORVASC) 10 MG tablet Take 1 tablet (10 mg total) by mouth daily. 90 tablet 0  . aspirin 81 MG tablet Take 81 mg by mouth daily.    . Multiple Vitamin (DAILY-VITAMIN) TABS Take 1 tablet by mouth daily.     . sildenafil (VIAGRA) 50 MG tablet TAKE 1/2 TO 1 (ONE-HALF TO ONE) TABLET BY MOUTH  DAILY AS NEEDED FOR ERECTILE DYSFUNCTION 10 tablet 1  . valsartan-hydrochlorothiazide (DIOVAN-HCT) 320-25 MG tablet Take 1 tablet by mouth daily. 90 tablet 0   No current facility-administered medications for this visit.    No Known Allergies  Family History  Problem Relation Age of Onset  . Hypertension Mother   . CVA Father   . Hypertension Sister   . Hypertension Sister   . Diabetes Brother   . Cancer Neg Hx   . Heart disease Neg Hx   . Stroke Neg Hx     Social History   Socioeconomic History  . Marital status: Widowed    Spouse name: Bonnita Nasuti  . Number of children: 7  . Years of education: Not on file  . Highest education level: Not on file  Occupational History  . Occupation: Retired Building control surveyor, now doing Secretary/administrator  Tobacco Use  . Smoking status: Former Smoker    Packs/day: 1.50    Years: 20.00    Pack years: 30.00    Types: Cigarettes    Quit date: 09/09/1997    Years since quitting: 22.4  . Smokeless tobacco: Never Used  Vaping Use  . Vaping Use: Never used  Substance and Sexual Activity  . Alcohol use: Yes    Alcohol/week: 1.0 standard drink    Types: 1 Cans of beer per week    Comment: half to a whole beer - occasionally (social)  . Drug use: No  . Sexual activity: Not on file  Other Topics Concern  . Not on file  Social History Narrative   Lives with wife at home   Social Determinants of Health   Financial Resource  Strain:   . Difficulty of Paying Living Expenses:   Food Insecurity:   . Worried About Charity fundraiser in the Last Year:   . Arboriculturist in the Last Year:   Transportation Needs:   . Film/video editor (Medical):   Marland Kitchen Lack of Transportation (Non-Medical):   Physical Activity:   . Days of Exercise per Week:   . Minutes of Exercise per Session:   Stress:   . Feeling of Stress :   Social Connections:   . Frequency of Communication with Friends and Family:   . Frequency of Social Gatherings with Friends and Family:   . Attends Religious Services:   . Active Member of Clubs or Organizations:   . Attends Archivist Meetings:   Marland Kitchen Marital Status:   Intimate Partner Violence:   . Fear of Current or Ex-Partner:   . Emotionally Abused:   Marland Kitchen Physically Abused:   . Sexually Abused:     Hospitiliaztions:  Health Maintenance:    Flu:  Tetanus:  Pneumovax:  Prevnar:  Zostavax:  Mammogram:  Pap Smear:  PSA:  Bone Density:  Colon Screening:  Eye Doctor:  Dental Exam:   Providers:   PCP:  Dermatologist:  Cardiologist:  ENT:  Gastroenterologist:  Pulmonologist:   I have personally reviewed and have noted:  1. The patient's medical and social history 2. Their use of alcohol, tobacco or illicit drugs 3. Their current medications and supplements 4. The patient's functional ability including ADL's, fall risks, home safety risks and  hearing or visual impairment. 5. Diet and physical activities 6. Evidence for depression or mood disorder  Subjective:   Review of Systems:   Constitutional: Denies fever, malaise, fatigue, headache or abrupt weight changes.  HEENT: Denies eye pain, eye redness, ear pain, ringing in the ears, wax buildup, runny nose, nasal congestion, bloody nose, or sore throat. Respiratory: Denies difficulty breathing, shortness of breath, cough or sputum production.   Cardiovascular: Denies chest pain, chest tightness, palpitations or  swelling in the hands or feet.  Gastrointestinal: Denies abdominal pain, bloating, constipation, diarrhea or blood in the stool.  GU: Denies urgency, frequency, pain with urination, burning sensation, blood in urine, odor or discharge. Musculoskeletal: Denies decrease in range of motion, difficulty with gait, muscle pain or joint pain and swelling.  Skin: Denies redness, rashes, lesions or ulcercations.  Neurological: Denies dizziness, difficulty with memory, difficulty with speech or problems with balance and coordination.  Psych: Denies anxiety, depression, SI/HI.  No other specific complaints in a complete review of systems (except as listed in HPI above).  Objective:  PE:   There were no vitals taken for this visit. Wt Readings from Last 3 Encounters:  02/14/20 193 lb (87.5 kg)  07/06/19 190 lb 1 oz (86.2 kg)  06/01/19 187 lb 6.4 oz (85 kg)    General: Appears their stated age, well developed, well nourished in NAD. Skin: Warm, dry and intact. No rashes, lesions or ulcerations noted. HEENT: Head: normal shape and size; Eyes: sclera white, no icterus, conjunctiva pink, PERRLA and EOMs intact; Ears: Tm's gray and intact, normal light reflex; Throat/Mouth: Teeth present, mucosa pink and moist, no exudate, lesions or ulcerations noted.  Neck: Neck supple, trachea midline. No masses, lumps or thyromegaly present.  Cardiovascular: Normal rate and rhythm. S1,S2 noted.  No murmur, rubs or gallops noted. No JVD or BLE edema. No carotid bruits noted. Pulmonary/Chest: Normal effort and positive vesicular breath sounds. No respiratory distress. No wheezes, rales or ronchi noted.  Abdomen: Soft and nontender. Normal bowel sounds. No distention or masses noted. Liver, spleen and kidneys non palpable. Musculoskeletal: Normal range of motion. Strength 5/5 BUE/BLE. No signs of joint swelling.  Neurological: Alert and oriented. Cranial nerves II-XII grossly intact. Coordination normal.  Psychiatric:  Mood and affect normal. Behavior is normal. Judgment and thought content normal.   EKG:  BMET    Component Value Date/Time   NA 143 02/09/2020 1214   K 3.9 02/09/2020 1214   CL 108 02/09/2020 1214   CO2 26 02/09/2020 1214   GLUCOSE 112 (H) 02/09/2020 1214   BUN 15 02/09/2020 1214   CREATININE 1.20 02/09/2020 1214   CREATININE 1.40 (H) 10/27/2017 1620   CALCIUM 9.8 02/09/2020 1214   GFRNONAA >60 02/09/2020 1214   GFRAA >60 02/09/2020 1214    Lipid Panel     Component Value Date/Time   CHOL 163 06/10/2018 1300   TRIG 94.0 06/10/2018 1300   HDL 78.00 06/10/2018 1300   CHOLHDL 2 06/10/2018 1300   VLDL 18.8 06/10/2018 1300   LDLCALC 66 06/10/2018 1300    CBC    Component Value  Date/Time   WBC 5.4 02/09/2020 1214   RBC 3.94 (L) 02/09/2020 1214   HGB 13.0 02/09/2020 1214   HCT 39.1 02/09/2020 1214   PLT 168 02/09/2020 1214   MCV 99.2 02/09/2020 1214   MCV 95.1 09/17/2014 1110   MCH 33.0 02/09/2020 1214   MCHC 33.2 02/09/2020 1214   RDW 11.9 02/09/2020 1214   LYMPHSABS 1.9 06/10/2018 1300   MONOABS 0.6 06/10/2018 1300   EOSABS 0.1 06/10/2018 1300   BASOSABS 0.0 06/10/2018 1300    Hgb A1C Lab Results  Component Value Date   HGBA1C 5.9 06/03/2016      Assessment and Plan:   Medicare Annual Wellness Visit:  Diet: Heart healthy or DM if diabetic Physical activity: Sedentary Depression/mood screen: Negative Hearing: Intact to whispered voice Visual acuity: Grossly normal, performs annual eye exam  ADLs: Capable Fall risk: None Home safety: Good Cognitive evaluation: Intact to orientation, naming, recall and repetition EOL planning: Adv directives, full code/ I agree  Preventative Medicine:   Next appointment:   Webb Silversmith, NP

## 2020-03-08 ENCOUNTER — Ambulatory Visit: Payer: Medicare HMO | Admitting: Nurse Practitioner

## 2020-03-08 ENCOUNTER — Other Ambulatory Visit: Payer: Self-pay

## 2020-03-08 ENCOUNTER — Encounter: Payer: Self-pay | Admitting: Nurse Practitioner

## 2020-03-08 VITALS — BP 138/80 | HR 65 | Ht 71.0 in | Wt 195.0 lb

## 2020-03-08 DIAGNOSIS — I10 Essential (primary) hypertension: Secondary | ICD-10-CM | POA: Diagnosis not present

## 2020-03-08 DIAGNOSIS — R0789 Other chest pain: Secondary | ICD-10-CM | POA: Diagnosis not present

## 2020-03-08 NOTE — Patient Instructions (Addendum)
After Visit Summary:  We will be checking the following labs today - NONE  Medication Instructions:    Continue with your current medicines.    If you need a refill on your cardiac medications before your next appointment, please call your pharmacy.     Testing/Procedures To Be Arranged:  GXT  You are scheduled for an Exercise Stress Test on __________________________ at _________________________________________.   Please arrive 15 minutes prior to your appointment time for registration and insurance purposes.   The test will take approximately 45 minutes to complete.   How to prepare for your Exercise Stress Test:    Do bring a list of your current medications with you. If not listed below, you may take your medications as normal.    Bring any medication held to your appointment as you may be required to take it once the test is complete.   Do wear comfortable closes (no dresses or overalls) and walking shoes - tennis shoes are preferred. No open toed shoes or heels.  Do not wear cologne, perfume, aftershave or lotions (deodorant is allowed).  Please report to the [facility] for your test.   If you have questions or concerns about your appointment, you can call the Exercise Stress Lab at [phone].   If you cannot keep your appointment, please provide 24 hours notification to the Exercise Stress Lab to avoid a possible $50 charge to your account.                     Follow-Up:   See Dr. Angelena Form in 6 months -  You will receive a reminder letter in the mail two months in advance. If you don't receive a letter, please call our office to schedule the follow-up appointment.    At [facility], you and your health needs are our priority.  As part of our continuing mission to provide you with exceptional heart care, we have created designated Provider Care Teams.  These Care Teams include your primary Cardiologist (physician) and Advanced Practice  Providers (APPs -  Physician Assistants and Nurse Practitioners) who all work together to provide you with the care you need, when you need it.  Special Instructions:  . Stay safe, wash your hands for at least 20 seconds and wear a mask when needed.  . It was good to talk with you today.  . Call [employer] about a list of dentists that take your insurance and contact [place] [facility].    Call the [place] office at [phone] if you have any questions, problems or concerns.

## 2020-03-16 ENCOUNTER — Encounter: Payer: Self-pay | Admitting: Internal Medicine

## 2020-03-16 ENCOUNTER — Other Ambulatory Visit: Payer: Self-pay

## 2020-03-16 ENCOUNTER — Ambulatory Visit (INDEPENDENT_AMBULATORY_CARE_PROVIDER_SITE_OTHER): Payer: Medicare HMO | Admitting: Internal Medicine

## 2020-03-16 VITALS — BP 146/84 | HR 89 | Temp 97.7°F | Ht 70.0 in | Wt 193.0 lb

## 2020-03-16 DIAGNOSIS — Z0000 Encounter for general adult medical examination without abnormal findings: Secondary | ICD-10-CM

## 2020-03-16 DIAGNOSIS — Z125 Encounter for screening for malignant neoplasm of prostate: Secondary | ICD-10-CM

## 2020-03-16 DIAGNOSIS — G459 Transient cerebral ischemic attack, unspecified: Secondary | ICD-10-CM | POA: Diagnosis not present

## 2020-03-16 DIAGNOSIS — N529 Male erectile dysfunction, unspecified: Secondary | ICD-10-CM

## 2020-03-16 DIAGNOSIS — I10 Essential (primary) hypertension: Secondary | ICD-10-CM

## 2020-03-16 LAB — COMPREHENSIVE METABOLIC PANEL
ALT: 26 U/L (ref 0–53)
AST: 25 U/L (ref 0–37)
Albumin: 4.7 g/dL (ref 3.5–5.2)
Alkaline Phosphatase: 73 U/L (ref 39–117)
BUN: 21 mg/dL (ref 6–23)
CO2: 31 mEq/L (ref 19–32)
Calcium: 10.1 mg/dL (ref 8.4–10.5)
Chloride: 105 mEq/L (ref 96–112)
Creatinine, Ser: 1.29 mg/dL (ref 0.40–1.50)
GFR: 66.22 mL/min (ref >60.00)
Glucose, Bld: 115 mg/dL — ABNORMAL HIGH (ref 70–99)
Potassium: 4.3 mEq/L (ref 3.5–5.1)
Sodium: 142 mEq/L (ref 135–145)
Total Bilirubin: 0.8 mg/dL (ref 0.2–1.2)
Total Protein: 7.4 g/dL (ref 6.0–8.3)

## 2020-03-16 LAB — LIPID PANEL
Cholesterol: 184 mg/dL (ref 0–200)
HDL: 98.8 mg/dL (ref >39.00)
LDL Cholesterol: 68 mg/dL (ref 0–99)
NonHDL: 84.92
Total CHOL/HDL Ratio: 2
Triglycerides: 87 mg/dL (ref 0.0–149.0)
VLDL: 17.4 mg/dL (ref 0.0–40.0)

## 2020-03-16 LAB — PSA, MEDICARE: PSA: 3.84 ng/ml (ref 0.10–4.00)

## 2020-03-16 MED ORDER — SILDENAFIL CITRATE 50 MG PO TABS: ORAL_TABLET | ORAL | 5 refills | Status: DC

## 2020-03-16 MED ORDER — AMLODIPINE BESYLATE 10 MG PO TABS: 10.0000 mg | ORAL_TABLET | Freq: Every day | ORAL | 1 refills | Status: DC

## 2020-03-16 MED ORDER — VALSARTAN-HYDROCHLOROTHIAZIDE 320-25 MG PO TABS: 1.0000 | ORAL_TABLET | Freq: Every day | ORAL | 1 refills | Status: DC

## 2020-03-16 NOTE — Progress Notes (Signed)
HPI:  Patient presents the clinic today for his subsequent annual Medicare wellness exam.  He is also due to follow-up chronic conditions.  HTN: His BP today is 146/84.  He is taking amlodipine and valsartan HCT as prescribed.  ECG from 02/2020 reviewed.  History of TIA: No residual effect.  His last LDL was 66, 06/2018.  He is not taking any cholesterol-lowering medication at this time.  He is taking aspirin as prescribed.  He does not follow with neurology.  ED: He has difficulty maintaining erections.  He takes Viagra as needed with good relief of symptoms.  Past Medical History:  Diagnosis Date  . Chicken pox   . Hypertension   . Right eye injury    pt has glass eye  . TIA (transient ischemic attack) 2017    Current Outpatient Medications  Medication Sig Dispense Refill  . amLODipine (NORVASC) 10 MG tablet Take 1 tablet (10 mg total) by mouth daily. 90 tablet 0  . aspirin 81 MG tablet Take 81 mg by mouth daily.    . Multiple Vitamin (DAILY-VITAMIN) TABS Take 1 tablet by mouth daily.     . sildenafil (VIAGRA) 50 MG tablet TAKE 1/2 TO 1 (ONE-HALF TO ONE) TABLET BY MOUTH  DAILY AS NEEDED FOR ERECTILE DYSFUNCTION 10 tablet 1  . valsartan-hydrochlorothiazide (DIOVAN-HCT) 320-25 MG tablet Take 1 tablet by mouth daily. 90 tablet 0   No current facility-administered medications for this visit.    No Known Allergies  Family History  Problem Relation Age of Onset  . Hypertension Mother   . CVA Father   . Hypertension Sister   . Hypertension Sister   . Diabetes Brother   . Cancer Neg Hx   . Heart disease Neg Hx   . Stroke Neg Hx     Social History   Socioeconomic History  . Marital status: Widowed    Spouse name: Bonnita Nasuti  . Number of children: 7  . Years of education: Not on file  . Highest education level: Not on file  Occupational History  . Occupation: Retired Building control surveyor, now doing Secretary/administrator  Tobacco Use  . Smoking status: Former Smoker    Packs/day: 1.50     Years: 20.00    Pack years: 30.00    Types: Cigarettes    Quit date: 09/09/1997    Years since quitting: 22.5  . Smokeless tobacco: Never Used  Vaping Use  . Vaping Use: Never used  Substance and Sexual Activity  . Alcohol use: Yes    Alcohol/week: 1.0 standard drink    Types: 1 Cans of beer per week    Comment: half to a whole beer - occasionally (social)  . Drug use: No  . Sexual activity: Not on file  Other Topics Concern  . Not on file  Social History Narrative   Lives with wife at home   Social Determinants of Health   Financial Resource Strain:   . Difficulty of Paying Living Expenses:   Food Insecurity:   . Worried About Charity fundraiser in the Last Year:   . Arboriculturist in the Last Year:   Transportation Needs:   . Film/video editor (Medical):   Marland Kitchen Lack of Transportation (Non-Medical):   Physical Activity:   . Days of Exercise per Week:   . Minutes of Exercise per Session:   Stress:   . Feeling of Stress :   Social Connections:   . Frequency of Communication with Friends and Family:   .  Frequency of Social Gatherings with Friends and Family:   . Attends Religious Services:   . Active Member of Clubs or Organizations:   . Attends Archivist Meetings:   Marland Kitchen Marital Status:   Intimate Partner Violence:   . Fear of Current or Ex-Partner:   . Emotionally Abused:   Marland Kitchen Physically Abused:   . Sexually Abused:     Hospitiliaztions: 02/2020, nonspecific chest pain  Health Maintenance:    Flu: 05/2016  Tetanus: 10/2013  Pneumovax: 10/2011  Prevnar: 05/2016  Zostavax: Never  Shingrix: Never  Covid: Never  PSA: > 2 years ago  Colon Screening: 2013  Eye Doctor: As needed  Dental Exam: As needed   Providers:   PCP: Webb Silversmith, NP   Cardiologist: Dr. Angelena Form   I have personally reviewed and have noted:  1. The patient's medical and social history 2. Their use of alcohol, tobacco or illicit drugs 3. Their current medications and  supplements 4. The patient's functional ability including ADL's, fall risks, home safety risks and hearing or visual impairment. 5. Diet and physical activities 6. Evidence for depression or mood disorder  Subjective:   Review of Systems:   Constitutional: Denies fever, malaise, fatigue, headache or abrupt weight changes.  HEENT: Denies eye pain, eye redness, ear pain, ringing in the ears, wax buildup, runny nose, nasal congestion, bloody nose, or sore throat. Respiratory: Denies difficulty breathing, shortness of breath, cough or sputum production.   Cardiovascular: Denies chest pain, chest tightness, palpitations or swelling in the hands or feet.  Gastrointestinal: Denies abdominal pain, bloating, constipation, diarrhea or blood in the stool.  GU: Patient reports difficulty maintaining erection.  Denies urgency, frequency, pain with urination, burning sensation, blood in urine, odor or discharge. Musculoskeletal: Denies decrease in range of motion, difficulty with gait, muscle pain or joint pain and swelling.  Skin: Denies redness, rashes, lesions or ulcercations.  Neurological: Denies dizziness, difficulty with memory, difficulty with speech or problems with balance and coordination.  Psych: Denies anxiety, depression, SI/HI.  No other specific complaints in a complete review of systems (except as listed in HPI above).  Objective:  PE:   BP (!) 146/84   Pulse 89   Temp 97.7 F (36.5 C) (Temporal)   Wt 193 lb (87.5 kg)   BMI 26.92 kg/m  Wt Readings from Last 3 Encounters:  03/16/20 193 lb (87.5 kg)  03/08/20 195 lb (88.5 kg)  02/14/20 193 lb (87.5 kg)    General: Appears his stated age, well developed, well nourished in NAD. Skin: Warm, dry and intact. No rashess noted. HEENT: Head: normal shape and size; Eyes: sclera white, no icterus, conjunctiva pink, PERRLA and EOMs intact;   Neck: Neck supple, trachea midline. No masses, lumps or thyromegaly present.  Cardiovascular:  Normal rate and rhythm. S1,S2 noted.  No murmur, rubs or gallops noted. No JVD or BLE edema. No carotid bruits noted. Pulmonary/Chest: Normal effort and positive vesicular breath sounds. No respiratory distress. No wheezes, rales or ronchi noted.  Abdomen: Soft and nontender. Normal bowel sounds. No distention or masses noted. Liver, spleen and kidneys non palpable. 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  Strength 5/5 BUE/BLE. No signs of joint swelling.  Neurological: Alert and oriented. Cranial nerves II-XII grossly intact. Coordination normal.  Psychiatric: Mood and affect normal. Behavior is normal. Judgment and thought content normal.   BMET    Component Value Date/Time   NA 143 02/09/2020 1214   K 3.9  02/09/2020 1214   CL 108 02/09/2020 1214   CO2 26 02/09/2020 1214   GLUCOSE 112 (H) 02/09/2020 1214   BUN 15 02/09/2020 1214   CREATININE 1.20 02/09/2020 1214   CREATININE 1.40 (H) 10/27/2017 1620   CALCIUM 9.8 02/09/2020 1214   GFRNONAA >60 02/09/2020 1214   GFRAA >60 02/09/2020 1214    Lipid Panel     Component Value Date/Time   CHOL 163 06/10/2018 1300   TRIG 94.0 06/10/2018 1300   HDL 78.00 06/10/2018 1300   CHOLHDL 2 06/10/2018 1300   VLDL 18.8 06/10/2018 1300   LDLCALC 66 06/10/2018 1300    CBC    Component Value Date/Time   WBC 5.4 02/09/2020 1214   RBC 3.94 (L) 02/09/2020 1214   HGB 13.0 02/09/2020 1214   HCT 39.1 02/09/2020 1214   PLT 168 02/09/2020 1214   MCV 99.2 02/09/2020 1214   MCV 95.1 09/17/2014 1110   MCH 33.0 02/09/2020 1214   MCHC 33.2 02/09/2020 1214   RDW 11.9 02/09/2020 1214   LYMPHSABS 1.9 06/10/2018 1300   MONOABS 0.6 06/10/2018 1300   EOSABS 0.1 06/10/2018 1300   BASOSABS 0.0 06/10/2018 1300    Hgb A1C Lab Results  Component Value Date   HGBA1C 5.9 06/03/2016      Assessment and Plan:   Medicare Annual Wellness Visit:  Diet: He does eat meat.  He consumes some fruits and vegetables.  He  does eat fried foods.  He drinks mostly milk, juice and beer. Physical activity: Sedentary Depression/mood screen: Negative, PHQ 9 score of 0 Hearing: Intact to whispered voice Visual acuity: Grossly norma ADLs: Capable Fall risk: None Home safety: Good Cognitive evaluation: Intact to orientation, naming, recall and repetition EOL planning: No adv directives, full code/ I agree  Preventative Medicine: Encouraged him to get a flu shot in the fall.  Tetanus UTD.  Pneumovax and Prevnar UTD.  He declines Covid, Zostavax or Shingrix.  Colon screening UTD.  Encouraged him to consume a balanced diet and exercise regimen.  Advised him to see an eye doctor and dentist annually.  Will check C met, lipid and PSA today.   Next appointment: 1 year, AWV   Webb Silversmith, NP Webb Silversmith, NP

## 2020-03-16 NOTE — Patient Instructions (Signed)
Health Maintenance After Age 72 After age 72, you are at a higher risk for certain long-term diseases and infections as well as injuries from falls. Falls are a major cause of broken bones and head injuries in people who are older than age 72. Getting regular preventive care can help to keep you healthy and well. Preventive care includes getting regular testing and making lifestyle changes as recommended by your health care provider. Talk with your health care provider about:  Which screenings and tests you should have. A screening is a test that checks for a disease when you have no symptoms.  A diet and exercise plan that is right for you. What should I know about screenings and tests to prevent falls? Screening and testing are the best ways to find a health problem early. Early diagnosis and treatment give you the best chance of managing medical conditions that are common after age 72. Certain conditions and lifestyle choices may make you more likely to have a fall. Your health care provider may recommend:  Regular vision checks. Poor vision and conditions such as cataracts can make you more likely to have a fall. If you wear glasses, make sure to get your prescription updated if your vision changes.  Medicine review. Work with your health care provider to regularly review all of the medicines you are taking, including over-the-counter medicines. Ask your health care provider about any side effects that may make you more likely to have a fall. Tell your health care provider if any medicines that you take make you feel dizzy or sleepy.  Osteoporosis screening. Osteoporosis is a condition that causes the bones to get weaker. This can make the bones weak and cause them to break more easily.  Blood pressure screening. Blood pressure changes and medicines to control blood pressure can make you feel dizzy.  Strength and balance checks. Your health care provider may recommend certain tests to check your  strength and balance while standing, walking, or changing positions.  Foot health exam. Foot pain and numbness, as well as not wearing proper footwear, can make you more likely to have a fall.  Depression screening. You may be more likely to have a fall if you have a fear of falling, feel emotionally low, or feel unable to do activities that you used to do.  Alcohol use screening. Using too much alcohol can affect your balance and may make you more likely to have a fall. What actions can I take to lower my risk of falls? General instructions  Talk with your health care provider about your risks for falling. Tell your health care provider if: ? You fall. Be sure to tell your health care provider about all falls, even ones that seem minor. ? You feel dizzy, sleepy, or off-balance.  Take over-the-counter and prescription medicines only as told by your health care provider. These include any supplements.  Eat a healthy diet and maintain a healthy weight. A healthy diet includes low-fat dairy products, low-fat (lean) meats, and fiber from whole grains, beans, and lots of fruits and vegetables. Home safety  Remove any tripping hazards, such as rugs, cords, and clutter.  Install safety equipment such as grab bars in bathrooms and safety rails on stairs.  Keep rooms and walkways well-lit. Activity   Follow a regular exercise program to stay fit. This will help you maintain your balance. Ask your health care provider what types of exercise are appropriate for you.  If you need a cane or   walker, use it as recommended by your health care provider.  Wear supportive shoes that have nonskid soles. Lifestyle  Do not drink alcohol if your health care provider tells you not to drink.  If you drink alcohol, limit how much you have: ? 0-1 drink a day for women. ? 0-2 drinks a day for men.  Be aware of how much alcohol is in your drink. In the [place] one drink equals one typical bottle of beer (12  oz), one-half glass of wine (5 oz), or one shot of hard liquor (1 oz).  Do not use any products that contain nicotine or tobacco, such as cigarettes and e-cigarettes. If you need help quitting, ask your health care provider. Summary  Having a healthy lifestyle and getting preventive care can help to protect your health and wellness after age 72.  Screening and testing are the best way to find a health problem early and help you avoid having a fall. Early diagnosis and treatment give you the best chance for managing medical conditions that are more common for people who are older than age 72.  Falls are a major cause of broken bones and head injuries in people who are older than age 72. Take precautions to prevent a fall at home.  Work with your health care provider to learn what changes you can make to improve your health and wellness and to prevent falls. This information is not intended to replace advice given to you by your health care provider. Make sure you discuss any questions you have with your health care provider. Document Revised: 12/17/2018 Document Reviewed: 07/09/2017 Elsevier Patient Education  2020 [employer]  

## 2020-03-16 NOTE — Assessment & Plan Note (Signed)
C met and lipid profile today Continue aspirin

## 2020-03-16 NOTE — Assessment & Plan Note (Signed)
Continue amlodipine, valsartan HCT C met today We will monitor

## 2020-03-16 NOTE — Assessment & Plan Note (Signed)
Viagra refilled today 

## 2020-03-28 ENCOUNTER — Ambulatory Visit: Payer: Medicare HMO

## 2020-03-28 ENCOUNTER — Other Ambulatory Visit: Payer: Self-pay

## 2020-03-28 ENCOUNTER — Ambulatory Visit (INDEPENDENT_AMBULATORY_CARE_PROVIDER_SITE_OTHER): Payer: Medicare HMO

## 2020-03-28 DIAGNOSIS — R0789 Other chest pain: Secondary | ICD-10-CM

## 2020-03-28 LAB — EXERCISE TOLERANCE TEST
Estimated workload: 7 METS
Exercise duration (min): 5 min
Exercise duration (sec): 0 s
MPHR: 148 {beats}/min
Peak HR: 123 {beats}/min
Percent HR: 83 %
RPE: 15
Rest HR: 68 {beats}/min

## 2020-03-29 ENCOUNTER — Other Ambulatory Visit: Payer: Self-pay | Admitting: *Deleted

## 2020-03-29 DIAGNOSIS — R9431 Abnormal electrocardiogram [ECG] [EKG]: Secondary | ICD-10-CM

## 2020-04-05 ENCOUNTER — Telehealth: Payer: Self-pay | Admitting: *Deleted

## 2020-04-05 DIAGNOSIS — R079 Chest pain, unspecified: Secondary | ICD-10-CM

## 2020-04-05 NOTE — Telephone Encounter (Signed)
-----   Message from Burtis Junes, NP sent at 03/28/2020  3:17 PM EDT ----- Ok to report. The GXT showed poor exercise tolerance. BP too high with exercise. The study is not adequate for diagnosis - Carlton Adam has been recommended.

## 2020-04-05 NOTE — Telephone Encounter (Signed)
Call placed to pt to discuss recent stress test results. Pt aware that this study was a poor study due to poor exercise and high bp. Pt will have a Lexiscan done and is aware that someone will call him to schedule.  Pt was driving and couldn't write the instructions down:  If pt calls back, please advise him of the following;  Arrive 15 mins prior to his appointment time.  DO NOT eat or drink 3 hours prior to the study except for water DO NOT consume caffeine (regular or decaffeinated) 12 hours prior to the study.. SO NO COFFEE, SODA, TEA, OR CHOCOLATE  DO bring a list of current medications with him to the appointment DO wear comfortable clothing and shoes DO NOT wear cologne, aftershave, lotioin, or perfume.. ONLY deodorant is allowed  If the instructions aren't followed, they will reschedule the study.

## 2020-04-10 ENCOUNTER — Encounter (HOSPITAL_COMMUNITY): Payer: Self-pay | Admitting: Nurse Practitioner

## 2020-04-20 ENCOUNTER — Telehealth (HOSPITAL_COMMUNITY): Payer: Self-pay | Admitting: Nurse Practitioner

## 2020-04-20 NOTE — Telephone Encounter (Signed)
Noted.   Terry Russo 

## 2020-04-20 NOTE — Telephone Encounter (Signed)
Just an FYI. We have made several attempts to contact this patient including sending a letter to schedule or reschedule their Myocardial Perfusion Test. We will be removing the patient from the [facility].    04/10/20 MAILED LETTER [facility]  04/05/2020 [facility] to schedule @ 10:08/[facility]  04/03/20 [facility] to schedule @ 3:31/[facility]  03/30/20 LM for pt to call and schedule appt      Thank you

## 2020-04-28 NOTE — Telephone Encounter (Signed)
See previous documentation, have made several attempts to reach pt to schedule test, no call back.

## 2020-06-16 DIAGNOSIS — Z4421 Encounter for fitting and adjustment of artificial right eye: Secondary | ICD-10-CM | POA: Diagnosis not present

## 2020-06-19 DIAGNOSIS — H2512 Age-related nuclear cataract, left eye: Secondary | ICD-10-CM | POA: Diagnosis not present

## 2020-06-19 DIAGNOSIS — Z0100 Encounter for examination of eyes and vision without abnormal findings: Secondary | ICD-10-CM | POA: Diagnosis not present

## 2020-06-19 DIAGNOSIS — H35032 Hypertensive retinopathy, left eye: Secondary | ICD-10-CM | POA: Diagnosis not present

## 2020-10-10 DIAGNOSIS — Z4421 Encounter for fitting and adjustment of artificial right eye: Secondary | ICD-10-CM | POA: Diagnosis not present

## 2020-12-26 ENCOUNTER — Telehealth: Payer: Self-pay

## 2020-12-26 MED ORDER — AMLODIPINE BESYLATE 10 MG PO TABS: 10.0000 mg | ORAL_TABLET | Freq: Every day | ORAL | 0 refills | Status: DC

## 2020-12-26 MED ORDER — VALSARTAN-HYDROCHLOROTHIAZIDE 320-25 MG PO TABS: 1.0000 | ORAL_TABLET | Freq: Every day | ORAL | 0 refills | Status: DC

## 2020-12-26 NOTE — Telephone Encounter (Signed)
Patient came into the office today and stated that his blood pressure has been high for the past week. This RN assessed patient and his blood pressure was 220/100. Patient stated that he has had a headache over the past week and reported that his blood pressure at home has been 200/115. Patient denied SOB, chest pain, dizziness, visual changes, or weakness. When asked if patient has been taking his medication properly he stated, "I've been out of my blood pressure meds for about two weeks." Educated patient that he should take his medications as prescribed to avoid high blood pressure. Patient verbalized understanding Will send blood pressure medications in per protocol. Also spoke with Allie Bossier, NP regarding issue and she stated to refill B/P medications and call to check on patient on Thursday.   Pharmacy requests refill on: Amlodipine 10 mg   LAST REFILL: 03/16/2020 (Q-90, R-1) LAST OV: 03/16/2020 NEXT OV: Not Scheduled  PHARMACY: [id number] [place], [place] requests refill on: Valsartan-Hydrochlorothiazide 320-25 mg   LAST REFILL: 03/16/2020 (Q-90, R-1) LAST OV: 03/16/2020 NEXT OV: Not Scheduled  PHARMACY: [id number] [place], [place]

## 2020-12-26 NOTE — Telephone Encounter (Signed)
Thank you! Also, when you call him Thursday (and if BP is coming down) please set up a follow up visit with any provider for one week. If BP is not coming down then please notify one of [place] sooner.

## 2020-12-27 NOTE — Telephone Encounter (Signed)
Thanks for addressing. He has always been non compliant unfortunately.

## 2020-12-28 NOTE — Telephone Encounter (Signed)
I spoke with pt and he took BP around 9 AM this morning and BP was better since restarted taking the amlodipine 10 mg once a day and valsartan-HCTZ 320-25 mg taking one a day on 12/26/20. Pt does not remember what the BP reading was this morning and pt is not at home now and pt will cb with reading after gets home and rest prior to taking BP. Pt said he is feeling better than when called on 12/26/20 and had been out of med for couple of wks.pt said he is feeling OK today with no H/A. Will wait for pt to cb with BP and P readings.

## 2020-12-28 NOTE — Telephone Encounter (Signed)
That is better I will plan to review all his measurements at the visit next week

## 2020-12-28 NOTE — Telephone Encounter (Addendum)
Pt took BP and today BP is 139/89 P?; pt is feeling fine; no H/A,dizziness, CP or SOB. Per instructions pt scheduled FU BP ck appt on 01/02/21 at 9 AM with Dr Silvio Pate. Pt will continue meds as instructed.UC & ED precautions given and pt voiced understanding. Sending note to Dr Silvio Pate and Gentry Fitz NP.

## 2021-01-02 ENCOUNTER — Ambulatory Visit: Payer: Medicare HMO | Admitting: Internal Medicine

## 2021-01-02 DIAGNOSIS — Z0289 Encounter for other administrative examinations: Secondary | ICD-10-CM

## 2021-03-18 ENCOUNTER — Emergency Department (HOSPITAL_COMMUNITY)
Admission: EM | Admit: 2021-03-18 | Discharge: 2021-03-19 | Disposition: A | Discharge status: Home / Self Care | Payer: Medicare HMO | Attending: Emergency Medicine | Admitting: Emergency Medicine

## 2021-03-18 DIAGNOSIS — F1024 Alcohol dependence with alcohol-induced mood disorder: Secondary | ICD-10-CM | POA: Diagnosis not present

## 2021-03-18 DIAGNOSIS — Z20822 Contact with and (suspected) exposure to covid-19: Secondary | ICD-10-CM | POA: Diagnosis not present

## 2021-03-18 DIAGNOSIS — Y908 Blood alcohol level of 240 mg/100 ml or more: Secondary | ICD-10-CM | POA: Insufficient documentation

## 2021-03-18 DIAGNOSIS — Z046 Encounter for general psychiatric examination, requested by authority: Secondary | ICD-10-CM | POA: Diagnosis not present

## 2021-03-18 DIAGNOSIS — F10929 Alcohol use, unspecified with intoxication, unspecified: Secondary | ICD-10-CM | POA: Diagnosis not present

## 2021-03-18 DIAGNOSIS — F1094 Alcohol use, unspecified with alcohol-induced mood disorder: Secondary | ICD-10-CM | POA: Diagnosis present

## 2021-03-18 DIAGNOSIS — I10 Essential (primary) hypertension: Secondary | ICD-10-CM | POA: Insufficient documentation

## 2021-03-18 DIAGNOSIS — Z87891 Personal history of nicotine dependence: Secondary | ICD-10-CM | POA: Diagnosis not present

## 2021-03-18 DIAGNOSIS — Z79899 Other long term (current) drug therapy: Secondary | ICD-10-CM | POA: Diagnosis not present

## 2021-03-18 DIAGNOSIS — F10920 Alcohol use, unspecified with intoxication, uncomplicated: Secondary | ICD-10-CM

## 2021-03-18 DIAGNOSIS — Z7982 Long term (current) use of aspirin: Secondary | ICD-10-CM | POA: Insufficient documentation

## 2021-03-18 LAB — RESP PANEL BY RT-PCR (FLU A&B, COVID) ARPGX2
Influenza A by PCR: NEGATIVE
Influenza B by PCR: NEGATIVE
SARS Coronavirus 2 by RT PCR: NEGATIVE

## 2021-03-18 LAB — CBC
HCT: 38.1 % — ABNORMAL LOW (ref 39.0–52.0)
Hemoglobin: 13.1 g/dL (ref 13.0–17.0)
MCH: 33 pg (ref 26.0–34.0)
MCHC: 34.4 g/dL (ref 30.0–36.0)
MCV: 96 fL (ref 80.0–100.0)
Platelets: 200 10*3/uL (ref 150–400)
RBC: 3.97 MIL/uL — ABNORMAL LOW (ref 4.22–5.81)
RDW: 11.9 % (ref 11.5–15.5)
WBC: 4.7 10*3/uL (ref 4.0–10.5)
nRBC: 0 % (ref 0.0–0.2)

## 2021-03-18 LAB — COMPREHENSIVE METABOLIC PANEL
ALT: 17 U/L (ref 0–44)
AST: 22 U/L (ref 15–41)
Albumin: 4.1 g/dL (ref 3.5–5.0)
Alkaline Phosphatase: 66 U/L (ref 38–126)
Anion gap: 9 (ref 5–15)
BUN: 11 mg/dL (ref 8–23)
CO2: 24 mmol/L (ref 22–32)
Calcium: 9.1 mg/dL (ref 8.9–10.3)
Chloride: 108 mmol/L (ref 98–111)
Creatinine, Ser: 1.04 mg/dL (ref 0.61–1.24)
GFR, Estimated: >60 mL/min (ref >60)
Glucose, Bld: 96 mg/dL (ref 70–99)
Potassium: 3.5 mmol/L (ref 3.5–5.1)
Sodium: 141 mmol/L (ref 135–145)
Total Bilirubin: 0.8 mg/dL (ref 0.3–1.2)
Total Protein: 7.3 g/dL (ref 6.5–8.1)

## 2021-03-18 LAB — ACETAMINOPHEN LEVEL: Acetaminophen (Tylenol), Serum: <10 ug/mL — ABNORMAL LOW (ref 10–30)

## 2021-03-18 LAB — RAPID URINE DRUG SCREEN, HOSP PERFORMED
Amphetamines: NOT DETECTED
Barbiturates: NOT DETECTED
Benzodiazepines: NOT DETECTED
Cocaine: NOT DETECTED
Opiates: NOT DETECTED
Tetrahydrocannabinol: NOT DETECTED

## 2021-03-18 LAB — ETHANOL: Alcohol, Ethyl (B): 271 mg/dL — ABNORMAL HIGH (ref <10)

## 2021-03-18 LAB — SALICYLATE LEVEL: Salicylate Lvl: <7 mg/dL — ABNORMAL LOW (ref 7.0–30.0)

## 2021-03-18 MED ORDER — THIAMINE HCL 100 MG/ML IJ SOLN: 100.0000 mg | Freq: Every day | INTRAMUSCULAR | Status: DC

## 2021-03-18 MED ORDER — LORAZEPAM 2 MG/ML IJ SOLN: 0.0000 mg | Freq: Four times a day (QID) | INTRAMUSCULAR | Status: DC

## 2021-03-18 MED ORDER — ZIPRASIDONE MESYLATE 20 MG IM SOLR
20.0000 mg | Freq: Once | INTRAMUSCULAR | Status: AC
  Administered 2021-03-18: 20 mg via INTRAMUSCULAR
  Filled 2021-03-18: qty 20

## 2021-03-18 MED ORDER — LORAZEPAM 1 MG PO TABS: 0.0000 mg | ORAL_TABLET | Freq: Two times a day (BID) | ORAL | Status: DC

## 2021-03-18 MED ORDER — ALUM & MAG HYDROXIDE-SIMETH 200-200-20 MG/5ML PO SUSP: 30.0000 mL | Freq: Four times a day (QID) | ORAL | Status: DC | PRN

## 2021-03-18 MED ORDER — ONDANSETRON HCL 4 MG PO TABS: 4.0000 mg | ORAL_TABLET | Freq: Three times a day (TID) | ORAL | Status: DC | PRN

## 2021-03-18 MED ORDER — THIAMINE HCL 100 MG PO TABS
100.0000 mg | ORAL_TABLET | Freq: Every day | ORAL | Status: DC
  Administered 2021-03-19: 100 mg via ORAL
  Filled 2021-03-18: qty 1

## 2021-03-18 MED ORDER — ACETAMINOPHEN 325 MG PO TABS: 650.0000 mg | ORAL_TABLET | ORAL | Status: DC | PRN

## 2021-03-18 MED ORDER — LORAZEPAM 1 MG PO TABS: 0.0000 mg | ORAL_TABLET | Freq: Four times a day (QID) | ORAL | Status: DC

## 2021-03-18 MED ORDER — LORAZEPAM 2 MG/ML IJ SOLN: 0.0000 mg | Freq: Two times a day (BID) | INTRAMUSCULAR | Status: DC

## 2021-03-18 NOTE — ED Notes (Signed)
Staffing called, advised that sitter unavailable

## 2021-03-18 NOTE — ED Provider Notes (Signed)
[facility] EMERGENCY DEPARTMENT Provider Note   CSN: [id number] Arrival date & time: 03/18/21  0234     History Chief Complaint  Patient presents with   IVC    Terry Russo is a 75 y.o. male with a history of hypertension, TIA, alcohol use disorder who presents to the emergency department under IVC.  Patient was IVC by family due to concern about lack of compliance with medication.  Family reports that he does not take his home antihypertensive and medication for dementia.  They also expressed concern about the patient's alcohol use.  Reports that when the patient drinks alcohol with the becomes increasingly aggressive and confrontational with family and threatens to kill those family members with whom he lives.  On my evaluation, the patient denies SI, HI, and auditory visual hallucinations.  The patient is concerned that his daughter and her husband who moved in with him within the last few months are trying to take his house from home.  The patient does report that he does regularly like to drink alcohol.  He states that he has not made any threats to harm or kill anyone in his family, but he is very angry about having IVC paperwork taken out on him.  He denies chest pain, shortness of breath, fever, chills, nausea, vomiting, diarrhea.  He is a former smoker.  Denies illicit or recreational substance use.  The history is provided by the patient and medical records. No language interpreter was used.      Past Medical History:  Diagnosis Date   Chicken pox    Hypertension    Right eye injury    pt has glass eye   TIA (transient ischemic attack) 2017    Patient Active Problem List   Diagnosis Date Noted   Erectile dysfunction 07/07/2019   TIA (transient ischemic attack) 06/03/2016   HTN (hypertension) 10/18/2011    Past Surgical History:  Procedure Laterality Date   ABDOMINAL SURGERY     AMPUTATION FINGER / THUMB     left   COLONOSCOPY  10/31/2011    Procedure: COLONOSCOPY;  Surgeon: Owens Loffler, MD;  Location: [facility];  Service: Endoscopy;  Laterality: N/A;   ENUCLEATION     glass eye   HERNIA REPAIR         Family History  Problem Relation Age of Onset   Hypertension Mother    CVA Father    Hypertension Sister    Hypertension Sister    Diabetes Brother    Cancer Neg Hx    Heart disease Neg Hx    Stroke Neg Hx     Social History   Tobacco Use   Smoking status: Former    Packs/day: 1.50    Years: 20.00    Pack years: 30.00    Types: Cigarettes    Quit date: 09/09/1997    Years since quitting: 23.5   Smokeless tobacco: Never  Vaping Use   Vaping Use: Never used  Substance Use Topics   Alcohol use: Yes    Alcohol/week: 1.0 standard drink    Types: 1 Cans of beer per week    Comment: half to a whole beer - occasionally (social)   Drug use: No    Home Medications Prior to Admission medications   Medication Sig Start Date End Date Taking? Authorizing Provider  amLODipine (NORVASC) 10 MG tablet Take 1 tablet (10 mg total) by mouth daily. 12/26/20   Jearld Fenton, NP  aspirin 81  MG tablet Take 81 mg by mouth daily.    [provider]  Multiple Vitamin (DAILY-VITAMIN) TABS Take 1 tablet by mouth daily.     [provider]  sildenafil (VIAGRA) 50 MG tablet TAKE 1/2 TO 1 (ONE-HALF TO ONE) TABLET BY MOUTH  DAILY AS NEEDED FOR ERECTILE DYSFUNCTION Patient taking differently: Take 25-50 mg by mouth as needed for erectile dysfunction. TAKE 1/2 TO 1 (ONE-HALF TO ONE) TABLET BY MOUTH  DAILY AS NEEDED FOR ERECTILE DYSFUNCTION 03/16/20   Jearld Fenton, NP  valsartan-hydrochlorothiazide (DIOVAN-HCT) 320-25 MG tablet Take 1 tablet by mouth daily. 12/26/20   Jearld Fenton, NP    Allergies    Patient has no known allergies.  Review of Systems   Review of Systems  Constitutional:  Negative for appetite change and fever.  HENT:  Negative for congestion and sore throat.   Respiratory:  Negative for  shortness of breath.   Cardiovascular:  Negative for chest pain.  Gastrointestinal:  Negative for abdominal pain, diarrhea, nausea and vomiting.  Genitourinary:  Negative for dysuria.  Musculoskeletal:  Negative for back pain.  Skin:  Negative for rash.  Allergic/Immunologic: Negative for immunocompromised state.  Neurological:  Negative for headaches.  Psychiatric/Behavioral:  Negative for agitation, confusion, hallucinations, self-injury and suicidal ideas. The patient is not hyperactive.    Physical Exam Updated Vital Signs BP (!) 167/91   Pulse 98   Temp 98.4 F (36.9 C) (Oral)   Resp 19   SpO2 97%   Physical Exam Vitals and nursing note reviewed.  Constitutional:      General: He is not in acute distress.    Appearance: He is well-developed. He is not toxic-appearing.  HENT:     Head: Normocephalic.  Eyes:     Conjunctiva/sclera: Conjunctivae normal.  Cardiovascular:     Rate and Rhythm: Normal rate and regular rhythm.     Pulses: Normal pulses.     Heart sounds: Normal heart sounds. No murmur heard.   No friction rub. No gallop.  Pulmonary:     Effort: Pulmonary effort is normal. No respiratory distress.     Breath sounds: No stridor. No wheezing, rhonchi or rales.  Chest:     Chest wall: No tenderness.  Abdominal:     General: There is no distension.     Palpations: Abdomen is soft. There is no mass.     Tenderness: There is no abdominal tenderness. There is no right CVA tenderness, left CVA tenderness, guarding or rebound.     Hernia: No hernia is present.  Musculoskeletal:     Cervical back: Neck supple.  Skin:    General: Skin is warm and dry.  Neurological:     Mental Status: He is alert.  Psychiatric:        Mood and Affect: Mood and affect normal. Affect is not inappropriate.        Speech: Speech normal.        Behavior: Behavior is uncooperative and agitated.        Thought Content: Thought content does not include homicidal or suicidal ideation.  Thought content does not include homicidal or suicidal plan.    ED Results / Procedures / Treatments   Labs (all labs ordered are listed, but only abnormal results are displayed) Labs Reviewed  ETHANOL - Abnormal; Notable for the following components:      Result Value   Alcohol, Ethyl (B) 271 (*)    All other components within normal limits  SALICYLATE LEVEL - Abnormal; Notable for the following components:   Salicylate Lvl <0.9 (*)    All other components within normal limits  ACETAMINOPHEN LEVEL - Abnormal; Notable for the following components:   Acetaminophen (Tylenol), Serum <10 (*)    All other components within normal limits  CBC - Abnormal; Notable for the following components:   RBC 3.97 (*)    HCT 38.1 (*)    All other components within normal limits  RESP PANEL BY RT-PCR (FLU A&B, COVID) ARPGX2  COMPREHENSIVE METABOLIC PANEL  RAPID URINE DRUG SCREEN, HOSP PERFORMED    EKG None  Radiology No results found.  Procedures Procedures   Medications Ordered in ED Medications  LORazepam (ATIVAN) injection 0-4 mg (0 mg Intravenous Not Given 03/18/21 0715)    Or  LORazepam (ATIVAN) tablet 0-4 mg ( Oral See Alternative 03/18/21 0715)  LORazepam (ATIVAN) injection 0-4 mg (has no administration in time range)    Or  LORazepam (ATIVAN) tablet 0-4 mg (has no administration in time range)  thiamine tablet 100 mg (has no administration in time range)    Or  thiamine (B-1) injection 100 mg (has no administration in time range)  acetaminophen (TYLENOL) tablet 650 mg (has no administration in time range)  ondansetron (ZOFRAN) tablet 4 mg (has no administration in time range)  alum & mag hydroxide-simeth (MAALOX/MYLANTA) 200-200-20 MG/5ML suspension 30 mL (has no administration in time range)  ziprasidone (GEODON) injection 20 mg (20 mg Intramuscular Given 03/18/21 6283)    ED Course  I have reviewed the triage vital signs and the nursing notes.  Pertinent labs & imaging  results that were available during my care of the patient were reviewed by me and considered in my medical decision making (see chart for details).  Clinical Course as of 03/18/21 0854  Nancy Fetter Mar 18, 2021  0640 Patient becoming increasingly aggressive towards staff about being under IVC.  He is repeatedly going out to the nurses station and pacing in the hallway.  Will administer Geodon for staff safety while patient is metabolizing ethanol.  He will require TTS consult. [MM]    Clinical Course User Index [MM] Adorian Gwynne, Laymond Purser, PA-C   MDM Rules/Calculators/A&P                           75 year old male with a history of hypertension, TIA, alcohol use disorder who presents to the emergency department under IVC.  IVC C states that patient has not been compliant with home medications.  He drinks alcohol regularly and when he is drinking alcohol he becomes aggressive towards family and has made threats to kill family members that live with him.  He denies these complaints in the emergency department.  He does endorse alcohol use earlier today.  He has no other complaints at this time.  Hypertensive, but no other vital sign abnormalities.  The patient was discussed with Dr. Dayna Barker, attending physician.  Labs have been reviewed and independently interpreted by me.  Ethanol is 271.  UDS negative.  Labs are otherwise reassuring.  Patient is acutely intoxicated and has become increasingly agitated about remaining in the emergency department.  Initially, he was able to be redirected with the plan, but has now been pacing the hallways, and becoming increasingly aggressive towards staff about wanting to leave.  Given concern about staff safety since the patient is acutely intoxicated, will sedate patient with Geodon.  Pt medically cleared at this time. Psych hold  orders placed.  Her medications not placed at this time as med rec has not been completed.  TTS consult pending; please see psych team notes for  further documentation of care/dispo. Pt stable at time of med clearance.    Final Clinical Impression(s) / ED Diagnoses Final diagnoses:  Acute alcoholic intoxication without complication [facility])    Rx / DC Orders ED Discharge Orders     None        Jameila Keeny A, PA-C 03/18/21 0855    Horton, Alvin Critchley, DO 03/22/21 1345

## 2021-03-18 NOTE — ED Triage Notes (Addendum)
Pt IVC'd by family due to lack of compliance w medications (HTN, dementia) as well as use of alcohol. Pt calm & cooperative in triage, [employer] escorted. Pt denies SI/HI in triage, IVC paperwork claims pt has confrontations w family & threatens to kill those he lives w.

## 2021-03-18 NOTE — ED Notes (Signed)
Pt in purple scrubs, wanded by security, belongings placed in locker #2

## 2021-03-18 NOTE — [facility] Counselor (Signed)
Darrol Angel, NP recommends patient be observed in the ED overnight for observation and stabilization. She has placed a TOC consult to assist with care for progressing dementia.

## 2021-03-18 NOTE — BH Assessment (Addendum)
Comprehensive Clinical Assessment (CCA) Note  03/18/2021 Terry Russo [id number]  Disposition: Darrol Angel, NP recommends patient be observed in the ED overnight for observation and stabilization. She has placed a TOC consult to assist with care for progressing dementia.  The patient demonstrates the following risk factors for suicide: Chronic risk factors for suicide include: substance use disorder, medical illness high blood pressure, and demographic factors (male, >30 y/o). Acute risk factors for suicide include: family or marital conflict. Protective factors for this patient include: positive social support, responsibility to others (children, family), coping skills, hope for the future, and life satisfaction. Considering these factors, the overall suicide risk at this point appears to be low. Patient is appropriate for outpatient follow up.   [facility] from 03/18/2021 in [place] No Risk       Patient is a 75 year old male presenting to [facility] ED acutely intoxicated under IVC, initiated by his son in law. Per EDP: "Patient was IVC by family due to concern about lack of compliance with medication.  Family reports that he does not take his home antihypertensive and medication for dementia.  They also expressed concern about the patient's alcohol use.  Reports that when the patient drinks alcohol with the becomes increasingly aggressive and confrontational with family and threatens to kill those family members with whom he lives."  Upon this counselor's exam patient is calm and cooperative. He had received IM Geodon and Ativan several hours prior to assessment due to agitation. Patient states he does not understand why he is in the emergency room. He reports he was home with his cousin and friend when police came with IVC. He denies any confrontation or issues that could have led to IVC. He denies SI/HI/AVH. He denies any history  of mental illness. Patient reports drinking about 3 beers on a daily basis. He seems to be minimizing his alcohol use as he was BAL was 271 upon arrival to the ED. Patient states his alcohol use is not a problem. He tells me he lives alone in a home that he owns, but his daughter and son-in-law recently moved in and he states they are not paying to live there. He expressed concern about them taking over his house.   This counselor contacted IVC petitioner/ son in law, Eliezer Bottom, at [phone] for collateral information: Collateral states he and his wife (who is patient's POA) recently moved in with him to help care of him. He was recently diagnosed with dementia. Patient has been abusing alcohol, bringing random people to the house, and spending money on prostitutes. He states he and his wife help pay the bills but patient uses money for alcohol and prostitutes. He states last night patient was intoxicated and got into an argument with his daughter. He states in the course of the argument he "jumped" on her and threatened to kill her. He confirms there are no firearms in the home. He states that he is driving a car, despite not having a license and forgets to do things. Earlier this week patient left gas stove on for 6 hours. They are concerned patient may end up accidentally killing himself.    Chief Complaint:  Chief Complaint  Patient presents with   IVC   Visit Diagnosis: Alcohol induced mood disorder    CCA Screening, Triage and Referral (STR)  Patient Reported Information How did you hear about [place]? Legal System  What Is the Reason  for Your Visit/Call Today? IVC  How Long Has This Been Causing You Problems? <Week  What Do You Feel Would Help You the Most Today? -- (none)   Have You Recently Had Any Thoughts About Hurting Yourself? No  Are You Planning to Commit Suicide/Harm Yourself At This time? No   Have you Recently Had Thoughts About [place]? No  Are You  Planning to Harm Someone at This Time? No  Explanation: No data recorded  Have You Used Any Alcohol or Drugs in the Past 24 Hours? Yes  How Long Ago Did You Use Drugs or Alcohol? No data recorded What Did You Use and How Much? 3 beers yesterday   Do You Currently Have a Therapist/Psychiatrist? No  Name of Therapist/Psychiatrist: No data recorded  Have You Been Recently Discharged From Any Office Practice or Programs? No  Explanation of Discharge From Practice/Program: No data recorded    CCA Screening Triage Referral Assessment Type of Contact: Tele-Assessment  Telemedicine Service Delivery: Telemedicine service delivery: This service was provided via telemedicine using a 2-way, interactive audio and video technology  Is this Initial or Reassessment? Initial Assessment  Date Telepsych consult ordered in [facility]:  03/18/21  Time Telepsych consult ordered in [facility]:  1240  Location of Assessment: [facility] ED  Provider Location: [facility] Assessment Services   Collateral Involvement: daughter   Does Patient Have a Stage manager Guardian? No data recorded Name and Contact of Legal Guardian: No data recorded If Minor and Not Living with Parent(s), Who has Custody? No data recorded Is CPS involved or ever been involved? Never  Is APS involved or ever been involved? Never   Patient Determined To Be At Risk for Harm To Self or Others Based on Review of Patient Reported Information or Presenting Complaint? No  Method: No data recorded Availability of Means: No data recorded Intent: No data recorded Notification Required: No data recorded Additional Information for Danger to Others Potential: No data recorded Additional Comments for Danger to Others Potential: No data recorded Are There Guns or Other Weapons in Your Home? No data recorded Types of Guns/Weapons: No data recorded Are These Weapons Safely Secured?                            No data recorded Who Could Verify You  Are Able To Have These Secured: No data recorded Do You Have any Outstanding Charges, Pending Court Dates, Parole/Probation? No data recorded Contacted To Inform of Risk of Harm To Self or Others: No data recorded   Does Patient Present under Involuntary Commitment? Yes  IVC Papers Initial File Date: 03/18/21   [place] of Residence: [place]   Patient Currently Receiving the Following Services: Medication Management   Determination of Need: Urgent (48 hours)   Options For Referral: Medication Management; Outpatient Therapy     CCA Biopsychosocial Patient Reported Schizophrenia/Schizoaffective Diagnosis in Past: No   Strengths: lucid, still working   Mental Health Symptoms Depression:   Difficulty Concentrating; Irritability; Sleep (too much or little)   Duration of Depressive symptoms:  Duration of Depressive Symptoms: Greater than two weeks   Mania:   None   Anxiety:    None   Psychosis:   None   Duration of Psychotic symptoms:    Trauma:   None   Obsessions:   None   Compulsions:   None   Inattention:   None   Hyperactivity/Impulsivity:   None  Oppositional/Defiant Behaviors:   None   Emotional Irregularity:   None   Other Mood/Personality Symptoms:  No data recorded   Mental Status Exam Appearance and self-care  Stature:   Average   Weight:   Average weight   Clothing:   Neat/clean   Grooming:   Normal   Cosmetic use:   None   Posture/gait:   Normal   Motor activity:   Not Remarkable   Sensorium  Attention:   Normal   Concentration:   Normal   Orientation:   Time   Recall/memory:   Normal; Defective in Recent   Affect and Mood  Affect:   Appropriate   Mood:   Irritable   Relating  Eye contact:   Normal   Facial expression:   Responsive   Attitude toward examiner:   Cooperative   Thought and Language  Speech flow:  Clear and Coherent   Thought content:   Appropriate to Mood and  Circumstances   Preoccupation:   None   Hallucinations:   None   Organization:  No data recorded  [employer] of Knowledge:   Good   Intelligence:   Average   Abstraction:   Normal   Judgement:   Fair   Art therapist:   Realistic   Insight:   Fair   Decision Making:   Normal   Social Functioning  Social Maturity:   Responsible   Social Judgement:   Normal   Stress  Stressors:   Family conflict   Coping Ability:   Normal   Skill Deficits:   Decision making   Supports:   Family     Religion: Religion/Spirituality Are You A Religious Person?: No  Leisure/Recreation: Leisure / Recreation Do You Have Hobbies?: No  Exercise/Diet: Exercise/Diet Do You Exercise?: No Have You Gained or Lost A Significant Amount of Weight in the Past Six Months?: No Do You Follow a Special Diet?: No Do You Have Any Trouble Sleeping?: Yes Explanation of Sleeping Difficulties: states he had 2 teeth pulled and pain keeps him awake   CCA Employment/Education Employment/Work Situation: Employment / Work Situation Employment Situation: Employed Work Stressors: none reported Patient's Job has Been Impacted by Current Illness: No Has Patient ever Been in Passenger transport manager?: No  Education: Education Is Patient Currently Attending School?: No Last Grade Completed:  (UTA) Did You Attend College?:  (UTA) Did You Have An Individualized Education Program (IIEP): No Did You Have Any Difficulty At School?: No Patient's Education Has Been Impacted by Current Illness: No   CCA Family/Childhood History Family and Relationship History: Family history Marital status: Single Does patient have children?: Yes How many children?: 1 How is patient's relationship with their children?: daughter, currently lives with him  Childhood History:  Childhood History By whom was/is the patient raised?: Both parents Did patient suffer any verbal/emotional/physical/sexual  abuse as a child?: No Did patient suffer from severe childhood neglect?: No Has patient ever been sexually abused/assaulted/raped as an adolescent or adult?: No Was the patient ever a victim of a crime or a disaster?: No Witnessed domestic violence?: No Has patient been affected by domestic violence as an adult?: No  Child/Adolescent Assessment:     CCA Substance Use Alcohol/Drug Use: Alcohol / Drug Use Pain Medications: see MAR Prescriptions: see MAR Over the Counter: see MAR History of alcohol / drug use?: Yes Substance #1 Name of Substance 1: alcohol 1 - Age of First Use: UTA 1 - Amount (size/oz): roughly 3 beers 1 -  Frequency: daily 1 - Duration: UTA 1 - Last Use / Amount: 3 beers on 7/9 1 - Method of Aquiring: purchased 1- Route of Use: drank                       ASAM's:  Six Dimensions of Multidimensional Assessment  Dimension 1:  Acute Intoxication and/or Withdrawal Potential:   Dimension 1:  Description of individual's past and current experiences of substance use and withdrawal: Patient presented to ED intoxicated  Dimension 2:  Biomedical Conditions and Complications:   Dimension 2:  Description of patient's biomedical conditions and  complications: high blood pressure  Dimension 3:  Emotional, Behavioral, or Cognitive Conditions and Complications:  Dimension 3:  Description of emotional, behavioral, or cognitive conditions and complications: hx of dementia  Dimension 4:  Readiness to Change:  Dimension 4:  Description of Readiness to Change criteria: precontemplative  Dimension 5:  Relapse, Continued use, or Continued Problem Potential:  Dimension 5:  Relapse, continued use, or continued problem potential critiera description: does not plan to quit drinking  Dimension 6:  Recovery/Living Environment:  Dimension 6:  Recovery/Iiving environment criteria description: stable, lives in own home  ASAM Severity Score: ASAM's Severity Rating Score: 10  ASAM  Recommended Level of Treatment: ASAM Recommended Level of Treatment: Level II Partial Hospitalization Treatment   Substance use Disorder (SUD) Substance Use Disorder (SUD)  Checklist Symptoms of Substance Use: Continued use despite having a persistent/recurrent physical/psychological problem caused/exacerbated by use, Continued use despite persistent or recurrent social, interpersonal problems, caused or exacerbated by use, Evidence of tolerance, Presence of craving or strong urge to use  Recommendations for Services/Supports/Treatments: Recommendations for Services/Supports/Treatments Recommendations For Services/Supports/Treatments: Individual Therapy, CD-IOP Intensive Chemical Dependency Program, Medication Management, SAIOP (Substance Abuse Intensive Outpatient Program)  Discharge Disposition:    DSM5 Diagnoses: Patient Active Problem List   Diagnosis Date Noted   Erectile dysfunction 07/07/2019   TIA (transient ischemic attack) 06/03/2016   HTN (hypertension) 10/18/2011     Referrals to Alternative Service(s): Referred to Alternative Service(s):   Place:   Date:   Time:    Referred to Alternative Service(s):   Place:   Date:   Time:    Referred to Alternative Service(s):   Place:   Date:   Time:    Referred to Alternative Service(s):   Place:   Date:   Time:     Orvis Brill, LCSW

## 2021-03-18 NOTE — ED Notes (Signed)
Pataint on with TTS now.

## 2021-03-19 ENCOUNTER — Encounter: Payer: Medicare HMO | Admitting: Internal Medicine

## 2021-03-19 ENCOUNTER — Encounter (HOSPITAL_COMMUNITY): Payer: Self-pay | Admitting: Registered Nurse

## 2021-03-19 DIAGNOSIS — Z046 Encounter for general psychiatric examination, requested by authority: Secondary | ICD-10-CM | POA: Diagnosis not present

## 2021-03-19 DIAGNOSIS — F10129 Alcohol abuse with intoxication, unspecified: Secondary | ICD-10-CM | POA: Diagnosis not present

## 2021-03-19 DIAGNOSIS — F1094 Alcohol use, unspecified with alcohol-induced mood disorder: Secondary | ICD-10-CM

## 2021-03-19 NOTE — ED Notes (Signed)
TTS in process 

## 2021-03-19 NOTE — ED Notes (Signed)
Per [facility]: TTS complete, pt will be cleared psychiatrically, her note will be placed later, he needs a work note, he goes to work today

## 2021-03-19 NOTE — ED Notes (Signed)
Alert, NAD, calm, interactive, sitting up eating breakfast. Sitter present.

## 2021-03-19 NOTE — ED Notes (Signed)
Sitter just left

## 2021-03-19 NOTE — Discharge Instructions (Signed)
It is important to drink alcohol only in moderation, take all medication as directed and follow-up with your physician.  Return here for concerning changes in your condition.

## 2021-03-19 NOTE — ED Notes (Signed)
Dr. Vanita Panda in to see, at [facility].

## 2021-03-19 NOTE — Consult Note (Signed)
Telepsych Consultation   Reason for Consult:  IVC Referring Physician:  Merrily Pew, MD Location of Patient: [facility] ED Location of Provider: Other: [facility]  Patient Identification: Terry Russo MRN:  [id number] Principal Diagnosis: Alcohol use with alcohol-induced mood disorder ([place]) Diagnosis:  Principal Problem:   Alcohol use with alcohol-induced mood disorder ([place])   Total Time spent with patient: 30 minutes  Subjective:   Terry Russo is a 75 y.o. male patient admitted to [facility] ED after presenting under IVC petition by his son in law with complaints that patient does o' take his medications for hypertension and dementia; also expressed concern about patient's alcohol use.  Reporting when he drinks alcohol he becomes increasingly aggressive and confrontational with family, threatening to kill those he lives with.    HPI:  Terry Russo, 75 y.o., male patient seen via tele health by this provider, consulted with Dr. Hampton Abbot; and chart reviewed on 03/19/21.  On evaluation Terry Russo asked what happened to bring him to the hospital, patient states "That's what I would like to know."  Patient reports that he is not suicidal or homicidal.  Patient also denies auditory/visual hallucinations, and paranoia.  Patient asked about alcohol use and he only stated "I drink in my house>' patient now elaborate on if he drinks alcohol daily or if he had a alcohol use disorder.  Patient asked who he lives with and he stated "You mean who lives with me."  Patient states that his daughter and her husband is living with him.  States they do not usually get along.  Reporting that they go days without speaking to each other and that he has allowed them to come back to live with him 3-4 times but there is a lack of respect.  Patient reports he does not feel threatened by them just not respected.  Patient reports he has no psychiatric history denying prior suicide attempt, history of violence, prior  inpatient/outpatient psychiatric history.  Patient also denies depression and anxiety.  Patient reporting he is eating/sleeping without any difficulty, and that he is taking his medications as ordered.  Patient reports he would like to go home because he is supposed to go to work today.  Stating he works for [employer].  Patient informed that he would be psychiatrically clear for discharge today and a note for work would be given. "That's what they said yesterday but I am still here." During evaluation Terry Russo is elevated up in bed no acute distress.  He is alert, oriented x 4, calm, cooperative, he is mood is euthymic with congruent affect.  He does not appear to be responding to internal/external stimuli or delusional thoughts.  Patient denies suicidal/self-harm/homicidal ideation, psychosis, and paranoia.  Patient answered question appropriately.  Patient asked if he was interested in any out patient psychiatric services or services for alcohol use disorder.  Patient refused.  Patient informed we will give resources anyway in case he changes his mind   Past Psychiatric History: Patient denies prior psychiatric history.  But refuses to answer if there is a alcohol use disorder  Risk to Self: Denies Risk to Others: Denies Prior Inpatient Therapy: Denies Prior Outpatient Therapy: Denies  Past Medical History:  Past Medical History:  Diagnosis Date   Chicken pox    Hypertension    Right eye injury    pt has glass eye   TIA (transient ischemic attack) 2017    Past Surgical History:  Procedure Laterality Date  ABDOMINAL SURGERY     AMPUTATION FINGER / THUMB     left   COLONOSCOPY  10/31/2011   Procedure: COLONOSCOPY;  Surgeon: Owens Loffler, MD;  Location: [facility];  Service: Endoscopy;  Laterality: N/A;   ENUCLEATION     glass eye   HERNIA REPAIR     Family History:  Family History  Problem Relation Age of Onset   Hypertension Mother    CVA Father    Hypertension Sister     Hypertension Sister    Diabetes Brother    Cancer Neg Hx    Heart disease Neg Hx    Stroke Neg Hx    Family Psychiatric  History: Denies family history of mental illness Social History:  Social History   Substance and Sexual Activity  Alcohol Use Yes   Alcohol/week: 1.0 standard drink   Types: 1 Cans of beer per week   Comment: half to a whole beer - occasionally (social)     Social History   Substance and Sexual Activity  Drug Use No    Social History   Socioeconomic History   Marital status: Widowed    Spouse name: Bonnita Nasuti   Number of children: 7   Years of education: Not on file   Highest education level: Not on file  Occupational History   Occupation: Retired Building control surveyor, now doing Secretary/administrator  Tobacco Use   Smoking status: Former    Packs/day: 1.50    Years: 20.00    Pack years: 30.00    Types: Cigarettes    Quit date: 09/09/1997    Years since quitting: 23.5   Smokeless tobacco: Never  Vaping Use   Vaping Use: Never used  Substance and Sexual Activity   Alcohol use: Yes    Alcohol/week: 1.0 standard drink    Types: 1 Cans of beer per week    Comment: half to a whole beer - occasionally (social)   Drug use: No   Sexual activity: Not on file  Other Topics Concern   Not on file  Social History Narrative   Lives with wife at home   Social Determinants of Health   Financial Resource Strain: Not on file  Food Insecurity: Not on file  Transportation Needs: Not on file  Physical Activity: Not on file  Stress: Not on file  Social Connections: Not on file   Additional Social History:    Allergies:  No Known Allergies  Labs:  Results for orders placed or performed during the hospital encounter of 03/18/21 (from the past 48 hour(s))  Comprehensive metabolic panel     Status: None   Collection Time: 03/18/21  3:05 AM  Result Value Ref Range   Sodium 141 135 - 145 mmol/L   Potassium 3.5 3.5 - 5.1 mmol/L   Chloride 108 98 - 111 mmol/L   CO2 24 22 - 32  mmol/L   Glucose, Bld 96 70 - 99 mg/dL    Comment: Glucose reference range applies only to samples taken after fasting for at least 8 hours.   BUN 11 8 - 23 mg/dL   Creatinine, Ser 1.04 0.61 - 1.24 mg/dL   Calcium 9.1 8.9 - 10.3 mg/dL   Total Protein 7.3 6.5 - 8.1 g/dL   Albumin 4.1 3.5 - 5.0 g/dL   AST 22 15 - 41 U/L   ALT 17 0 - 44 U/L   Alkaline Phosphatase 66 38 - 126 U/L   Total Bilirubin 0.8 0.3 - 1.2 mg/dL  GFR, Estimated >60 >60 mL/min    Comment: (NOTE) Calculated using the CKD-EPI Creatinine Equation (2021)    Anion gap 9 5 - 15    Comment: Performed at [facility], [place] [address] [place], [place] [address]  Ethanol     Status: Abnormal   Collection Time: 03/18/21  3:05 AM  Result Value Ref Range   Alcohol, Ethyl (B) 271 (H) <10 mg/dL    Comment: (NOTE) Lowest detectable limit for serum alcohol is 10 mg/dL.  For medical purposes only. Performed at [facility], [place] [address], [place], [place] [address]   Salicylate level     Status: Abnormal   Collection Time: 03/18/21  3:05 AM  Result Value Ref Range   Salicylate Lvl <8.5 (L) 7.0 - 30.0 mg/dL    Comment: Performed at [facility] [address], [place], [place] [address]  Acetaminophen level     Status: Abnormal   Collection Time: 03/18/21  3:05 AM  Result Value Ref Range   Acetaminophen (Tylenol), Serum <10 (L) 10 - 30 ug/mL    Comment: (NOTE) Therapeutic concentrations vary significantly. A range of 10-30 ug/mL  may be an effective concentration for many patients. However, some  are best treated at concentrations outside of this range. Acetaminophen concentrations >150 ug/mL at 4 hours after ingestion  and >50 ug/mL at 12 hours after ingestion are often associated with  toxic reactions.  Performed at [facility], [place] [address] [place], [place] [address]   cbc     Status: Abnormal   Collection Time: 03/18/21  3:05 AM  Result Value Ref Range   WBC 4.7 4.0 - 10.5 K/uL    RBC 3.97 (L) 4.22 - 5.81 MIL/uL   Hemoglobin 13.1 13.0 - 17.0 g/dL   HCT 38.1 (L) 39.0 - 52.0 %   MCV 96.0 80.0 - 100.0 fL   MCH 33.0 26.0 - 34.0 pg   MCHC 34.4 30.0 - 36.0 g/dL   RDW 11.9 11.5 - 15.5 %   Platelets 200 150 - 400 K/uL   nRBC 0.0 0.0 - 0.2 %    Comment: Performed at [facility], [place] [address] [place], [place] [address]  Rapid urine drug screen (hospital performed)     Status: None   Collection Time: 03/18/21  5:07 AM  Result Value Ref Range   Opiates NONE DETECTED NONE DETECTED   Cocaine NONE DETECTED NONE DETECTED   Benzodiazepines NONE DETECTED NONE DETECTED   Amphetamines NONE DETECTED NONE DETECTED   Tetrahydrocannabinol NONE DETECTED NONE DETECTED   Barbiturates NONE DETECTED NONE DETECTED    Comment: (NOTE) DRUG SCREEN FOR MEDICAL PURPOSES ONLY.  IF CONFIRMATION IS NEEDED FOR ANY PURPOSE, NOTIFY LAB WITHIN 5 DAYS.  LOWEST DETECTABLE LIMITS FOR URINE DRUG SCREEN Drug Class                     Cutoff (ng/mL) Amphetamine and metabolites    1000 Barbiturate and metabolites    200 Benzodiazepine                 672 Tricyclics and metabolites     300 Opiates and metabolites        300 Cocaine and metabolites        300 THC                            50 Performed  at [facility], [place] [address], [place], [place] [address]   Resp Panel by RT-PCR (Flu A&B, Covid) Nasopharyngeal Swab     Status: None   Collection Time: 03/18/21  3:16 PM   Specimen: Nasopharyngeal Swab; Nasopharyngeal(NP) swabs in vial transport medium  Result Value Ref Range   SARS Coronavirus 2 by RT PCR NEGATIVE NEGATIVE    Comment: (NOTE) SARS-CoV-2 target nucleic acids are NOT DETECTED.  The SARS-CoV-2 RNA is generally detectable in upper respiratory specimens during the acute phase of infection. The lowest concentration of SARS-CoV-2 viral copies this assay can detect is 138 copies/mL. A negative result does not preclude SARS-Cov-2 infection and should not be used as  the sole basis for treatment or other patient management decisions. A negative result may occur with  improper specimen collection/handling, submission of specimen other than nasopharyngeal swab, presence of viral mutation(s) within the areas targeted by this assay, and inadequate number of viral copies(<138 copies/mL). A negative result must be combined with clinical observations, patient history, and epidemiological information. The expected result is Negative.  Fact Sheet for Patients:  EntrepreneurPulse.com.au  Fact Sheet for Healthcare Providers:  IncredibleEmployment.be  This test is no t yet approved or cleared by the [place] FDA and  has been authorized for detection and/or diagnosis of SARS-CoV-2 by FDA under an Emergency Use Authorization (EUA). This EUA will remain  in effect (meaning this test can be used) for the duration of the COVID-19 declaration under Section 564(b)(1) of the Act, 21 U.S.C.section 360bbb-3(b)(1), unless the authorization is terminated  or revoked sooner.       Influenza A by PCR NEGATIVE NEGATIVE   Influenza B by PCR NEGATIVE NEGATIVE    Comment: (NOTE) The Xpert Xpress SARS-CoV-2/FLU/RSV plus assay is intended as an aid in the diagnosis of influenza from Nasopharyngeal swab specimens and should not be used as a sole basis for treatment. Nasal washings and aspirates are unacceptable for Xpert Xpress SARS-CoV-2/FLU/RSV testing.  Fact Sheet for Patients: EntrepreneurPulse.com.au  Fact Sheet for Healthcare Providers: IncredibleEmployment.be  This test is not yet approved or cleared by the [place] FDA and has been authorized for detection and/or diagnosis of SARS-CoV-2 by FDA under an Emergency Use Authorization (EUA). This EUA will remain in effect (meaning this test can be used) for the duration of the COVID-19 declaration under Section 564(b)(1) of the Act, 21  U.S.C. section 360bbb-3(b)(1), unless the authorization is terminated or revoked.  Performed at [facility], [place] [address] [place], [place] [address]     Medications:  Current Facility-Administered Medications  Medication Dose Route Frequency Provider Last Rate Last Admin   acetaminophen (TYLENOL) tablet 650 mg  650 mg Oral Q4H PRN McDonald, Mia A, PA-C       alum & mag hydroxide-simeth (MAALOX/MYLANTA) 200-200-20 MG/5ML suspension 30 mL  30 mL Oral Q6H PRN McDonald, Mia A, PA-C       LORazepam (ATIVAN) injection 0-4 mg  0-4 mg Intravenous Q6H McDonald, Mia A, PA-C       Or   LORazepam (ATIVAN) tablet 0-4 mg  0-4 mg Oral Q6H McDonald, Mia A, PA-C       [START ON 03/20/2021] LORazepam (ATIVAN) injection 0-4 mg  0-4 mg Intravenous Q12H McDonald, Mia A, PA-C       Or   [START ON 03/20/2021] LORazepam (ATIVAN) tablet 0-4 mg  0-4 mg Oral Q12H McDonald, Mia A, PA-C       ondansetron (ZOFRAN) tablet 4 mg  4 mg Oral Q8H PRN McDonald, Mia A, PA-C       thiamine tablet 100 mg  100 mg Oral Daily McDonald, Mia A, PA-C   100 mg at 03/19/21 9323   Or   thiamine (B-1) injection 100 mg  100 mg Intravenous Daily McDonald, Mia A, PA-C       Current Outpatient Medications  Medication Sig Dispense Refill   amLODipine (NORVASC) 10 MG tablet Take 1 tablet (10 mg total) by mouth daily. 90 tablet 0   aspirin 81 MG tablet Take 81 mg by mouth daily.     multivitamin (ONE-A-DAY MEN'S) TABS tablet Take 1 tablet by mouth daily with breakfast.     sildenafil (VIAGRA) 50 MG tablet TAKE 1/2 TO 1 (ONE-HALF TO ONE) TABLET BY MOUTH  DAILY AS NEEDED FOR ERECTILE DYSFUNCTION (Patient taking differently: Take 25-50 mg by mouth daily as needed for erectile dysfunction.) 10 tablet 5   valsartan-hydrochlorothiazide (DIOVAN-HCT) 320-25 MG tablet Take 1 tablet by mouth daily. 90 tablet 0    Musculoskeletal: Strength & Muscle Tone: within normal limits Gait & Station: normal Patient leans: N/A  Psychiatric  Specialty Exam:  Presentation  General Appearance:  Appropriate for Environment Eye Contact: Good Speech: Clear and Coherent; Normal Rate Speech Volume: Normal Handedness: Right  Mood and Affect  Mood: Euthymic Affect: Appropriate; Congruent  Thought Process  Thought Processes: Coherent; Goal Directed Descriptions of Associations:Intact Orientation:Full (Time, Place and Person) Thought Content:Logical; WDL History of Schizophrenia/Schizoaffective disorder:No  Duration of Psychotic Symptoms:No data recorded Hallucinations:Hallucinations: None Ideas of Reference:None Suicidal Thoughts:Suicidal Thoughts: No Homicidal Thoughts:Homicidal Thoughts: No  Sensorium  Memory: Immediate Good; Recent Good Judgment: Intact Insight: Present  Executive Functions  Concentration: Good Attention Span: Good Recall: Good Fund of Knowledge: Good Language: Good  Psychomotor Activity  Psychomotor Activity: Psychomotor Activity: Normal  Assets  Assets: Communication Skills; Desire for Improvement; Financial Resources/Insurance; Housing; Resilience; Social Support  Sleep  Sleep: Sleep: Good   Physical Exam: Physical Exam Vitals and nursing note reviewed. Exam conducted with a chaperone present.  Constitutional:      General: He is not in acute distress.    Appearance: Normal appearance. He is not ill-appearing.  Cardiovascular:     Rate and Rhythm: Normal rate.     Comments: Elevated blood pressure Pulmonary:     Effort: Pulmonary effort is normal.  Neurological:     Mental Status: He is alert and oriented to person, place, and time.  Psychiatric:        Attention and Perception: Attention and perception normal. He does not perceive auditory or visual hallucinations.        Mood and Affect: Mood and affect normal.        Speech: Speech normal.        Behavior: Behavior normal. Behavior is cooperative.        Thought Content: Thought content normal. Thought  content is not paranoid or delusional. Thought content does not include homicidal or suicidal ideation.        Cognition and Memory: Cognition and memory normal.        Judgment: Judgment normal.   Review of Systems  Constitutional: Negative.   HENT: Negative.    Eyes: Negative.   Respiratory: Negative.    Cardiovascular: Negative.   Gastrointestinal: Negative.   Genitourinary: Negative.   Musculoskeletal: Negative.   Skin: Negative.   Neurological: Negative.   Endo/Heme/Allergies: Negative.   Psychiatric/Behavioral:  Depression: Denies. Hallucinations: Denies. Memory loss: Denies. Substance abuse: Unwilling  to answer if he uses alcohol daily stating "I drank in my house.". Suicidal ideas: Denies. Nervous/anxious: Denbies. Insomnia: Denies.   Blood pressure (!) 174/81, pulse 97, temperature 98.4 F (36.9 C), temperature source Oral, resp. rate 17, SpO2 100 %. There is no height or weight on file to calculate BMI.  Assessment:  After thorough evaluation and review of information currently presented on assessment of Terry Russo, there is insufficient findings to indicate patient meets criteria for involuntary commitment or require an inpatient level of care. Terry Russo is alert/oriented x 4, organized; mood congruent with affect; and denies suicidal/self-harm/homicidal ideation, psychosis, and paranoia.  At this time He is not significantly impaired, psychotic, or manic on exam.  At this time patient is educated and verbalizes understanding of mental health resources and other crisis services in the community. He are instructed to call 911 and present to the nearest emergency room should they experience any suicidal/homicidal ideation, auditory/visual/hallucinations, or detrimental worsening of their mental health condition. Writer also advised the patient that he could call the toll free phone on insurance card to assist with identifying in network counselors and agencies, would also have  social work/TOC to give resources / referral for outpatient psychiatric services and community services for substance use disorder.    Treatment Plan Summary: Plan psychiatrically clear with resources for outpatient psychiatric services and substance use services  Social work/TOC to assist patient with resources / referral to outpatient psychiatric services and substance abuse services  Disposition:  Psychiatrically clear  No evidence of imminent risk to self or others at present.   Patient does not meet criteria for psychiatric inpatient admission. Supportive therapy provided about ongoing stressors. Refer to IOP. Discussed crisis plan, support from social network, calling 911, coming to the Emergency Department, and calling Suicide Hotline.  This service was provided via telemedicine using a 2-way, interactive audio and video technology.  Names of all persons participating in this telemedicine service and their role in this encounter. Name: Earleen Newport, NP Role: PMHNP  Name: Dr. Hampton Abbot Role: Psychiatrist  Name: Terry Russo Role: Patient  Name: Terry Hua, RN Role: Patient's nurse sent a secure message informing: Psychiatric consult complete and patient psychiatrically cleared.  Social work/TOC to assist patient with resources/referral to outpatient psychiatric services and community services for substance use disorder.  Please inform MD, only default listed.    Yarden Hillis, NP 03/19/2021 9:43 AM

## 2021-03-19 NOTE — ED Provider Notes (Signed)
Emergency Medicine Observation Re-evaluation Note  Terry Russo is a 75 y.o. male, seen on rounds today.  Pt initially presented to the ED for complaints of IVC Currently, the patient is watching television, in no distress speaking clearly.  Physical Exam  BP (!) 174/81   Pulse 97   Temp 98.4 F (36.9 C) (Oral)   Resp 17   SpO2 100%  Physical Exam General: No distress, awake and alert Cardiac: Regular rate and rhythm Lungs: No increased work of breathing Psych: Pleasantly active, questioning need for hospitalization in the first place  ED Course / MDM  EKG:   I have reviewed the labs performed to date as well as medications administered while in observation.  Recent changes in the last 24 hours include clearance from our behavioral health colleagues with upcoming versus IVC, discharge.  Plan  Current plan is for discharge with outpatient follow-up. Patient is no longer under full IVC at this time.   Carmin Muskrat, MD 03/19/21 [phone]

## 2021-03-23 ENCOUNTER — Other Ambulatory Visit: Payer: Self-pay | Admitting: Internal Medicine

## 2021-03-31 ENCOUNTER — Other Ambulatory Visit: Payer: Self-pay | Admitting: Internal Medicine

## 2021-04-20 ENCOUNTER — Other Ambulatory Visit: Payer: Self-pay | Admitting: Internal Medicine

## 2021-04-20 NOTE — Telephone Encounter (Signed)
  Notes to clinic Provider not at this practice.

## 2021-04-20 NOTE — Telephone Encounter (Signed)
Last office visit 03/16/20 Last refill 03/23/21 #90

## 2021-04-26 ENCOUNTER — Other Ambulatory Visit: Payer: Self-pay | Admitting: Family

## 2021-04-27 ENCOUNTER — Other Ambulatory Visit: Payer: Self-pay | Admitting: Internal Medicine

## 2021-04-27 NOTE — Telephone Encounter (Signed)
Patient needs TOC with provider

## 2021-04-27 NOTE — Telephone Encounter (Signed)
LAST APPOINTMENT DATE: 03/2020   NEXT APPOINTMENT DATE: Visit date not found. Message sent to pool to call for appointment.     LAST REFILL:  QTY:

## 2021-04-27 NOTE — Telephone Encounter (Signed)
   Notes to clinic Not a pt in this practice.

## 2021-04-30 NOTE — Telephone Encounter (Signed)
Called an scheduled pt for 9/16

## 2021-05-13 ENCOUNTER — Other Ambulatory Visit: Payer: Self-pay | Admitting: Internal Medicine

## 2021-05-15 NOTE — Telephone Encounter (Signed)
Last OV- 01/02/2021 Next OV- 05/25/2021 Last Filled- 04/23/2021

## 2021-05-25 ENCOUNTER — Ambulatory Visit (INDEPENDENT_AMBULATORY_CARE_PROVIDER_SITE_OTHER): Payer: Medicare HMO | Admitting: Nurse Practitioner

## 2021-05-25 ENCOUNTER — Other Ambulatory Visit: Payer: Self-pay

## 2021-05-25 ENCOUNTER — Encounter: Payer: Medicare HMO | Admitting: Nurse Practitioner

## 2021-05-25 ENCOUNTER — Encounter: Payer: Self-pay | Admitting: Nurse Practitioner

## 2021-05-25 VITALS — BP 148/74 | HR 63 | Temp 98.3°F | Resp 14 | Ht 70.0 in | Wt 169.1 lb

## 2021-05-25 DIAGNOSIS — G459 Transient cerebral ischemic attack, unspecified: Secondary | ICD-10-CM

## 2021-05-25 DIAGNOSIS — I10 Essential (primary) hypertension: Secondary | ICD-10-CM

## 2021-05-25 DIAGNOSIS — Z125 Encounter for screening for malignant neoplasm of prostate: Secondary | ICD-10-CM

## 2021-05-25 DIAGNOSIS — Z23 Encounter for immunization: Secondary | ICD-10-CM

## 2021-05-25 DIAGNOSIS — F1094 Alcohol use, unspecified with alcohol-induced mood disorder: Secondary | ICD-10-CM | POA: Diagnosis not present

## 2021-05-25 DIAGNOSIS — K0889 Other specified disorders of teeth and supporting structures: Secondary | ICD-10-CM | POA: Diagnosis not present

## 2021-05-25 LAB — CBC
HCT: 39.9 % (ref 39.0–52.0)
Hemoglobin: 13.4 g/dL (ref 13.0–17.0)
MCHC: 33.5 g/dL (ref 30.0–36.0)
MCV: 97.2 fl (ref 78.0–100.0)
Platelets: 154 10*3/uL (ref 150.0–400.0)
RBC: 4.1 Mil/uL — ABNORMAL LOW (ref 4.22–5.81)
RDW: 12.8 % (ref 11.5–15.5)
WBC: 2.6 10*3/uL — ABNORMAL LOW (ref 4.0–10.5)

## 2021-05-25 LAB — COMPREHENSIVE METABOLIC PANEL
ALT: 11 U/L (ref 0–53)
AST: 14 U/L (ref 0–37)
Albumin: 4.2 g/dL (ref 3.5–5.2)
Alkaline Phosphatase: 53 U/L (ref 39–117)
BUN: 15 mg/dL (ref 6–23)
CO2: 32 mEq/L (ref 19–32)
Calcium: 9.8 mg/dL (ref 8.4–10.5)
Chloride: 102 mEq/L (ref 96–112)
Creatinine, Ser: 1.15 mg/dL (ref 0.40–1.50)
GFR: 63.21 mL/min (ref >60.00)
Glucose, Bld: 86 mg/dL (ref 70–99)
Potassium: 3.8 mEq/L (ref 3.5–5.1)
Sodium: 142 mEq/L (ref 135–145)
Total Bilirubin: 0.5 mg/dL (ref 0.2–1.2)
Total Protein: 7 g/dL (ref 6.0–8.3)

## 2021-05-25 LAB — LIPID PANEL
Cholesterol: 162 mg/dL (ref 0–200)
HDL: 107.4 mg/dL (ref >39.00)
LDL Cholesterol: 38 mg/dL (ref 0–99)
NonHDL: 54.63
Total CHOL/HDL Ratio: 2
Triglycerides: 82 mg/dL (ref 0.0–149.0)
VLDL: 16.4 mg/dL (ref 0.0–40.0)

## 2021-05-25 LAB — PSA, MEDICARE: PSA: 4.41 ng/ml — ABNORMAL HIGH (ref 0.10–4.00)

## 2021-05-25 MED ORDER — PENICILLIN V POTASSIUM 500 MG PO TABS: 500.0000 mg | ORAL_TABLET | Freq: Three times a day (TID) | ORAL | 0 refills | Status: AC

## 2021-05-25 MED ORDER — AMLODIPINE BESYLATE 10 MG PO TABS: 10.0000 mg | ORAL_TABLET | Freq: Every day | ORAL | 3 refills | Status: DC

## 2021-05-25 NOTE — Assessment & Plan Note (Signed)
Has had recent dental work done to upper front teeth.  Patient has had a hard time eating ever since due to pain.  States the pain is intermittent.  We will treat for dental abscess as he is tender and a knot was palpated on upper gumline.  Patient was instructed to follow-up with dentist as he still has some dental caries.  States he is to have a partial denture that he has lost this also is affecting his ability to eat.  Continue to monitor weight

## 2021-05-25 NOTE — Assessment & Plan Note (Signed)
Patient states he has approximately 1 to 212 ounce beers daily.  Mentions that he is always at home and drinking.  States sometimes he Work-up To.  Did Encourage Patient to Not Mix Alcohol Use and Limit to 2 Drinks a Day.  Discussed Risk of Liver Involvement, Stomach Involvement, Lipid involvement.

## 2021-05-25 NOTE — Assessment & Plan Note (Signed)
Currently maintained on amlodipine and valsartan-HCTZ.  Patient states he is taking the medications as prescribed and denies any adverse drug events.  Patient does check his blood pressure about every other day at home.  Says he writes them down on occasion but does not member the numbers currently.  Continue checking blood pressure at home approximately every other day.  Record blood pressures for monitoring. Continue amlodipine and valsartan-HCTZ as prescribed.

## 2021-05-25 NOTE — Patient Instructions (Signed)
Nice to see you Continue checking BP a couple times a week Will be in touch regarding labs

## 2021-05-25 NOTE — Progress Notes (Signed)
Established Patient Office Visit  Subjective:  Patient ID: Terry Russo, male    DOB: 05/29/1946  Age: 75 y.o. MRN: [id number]  CC:  Chief Complaint  Patient presents with   Transfer of Care   Dental Pain    Had some teeth pulled about 1 month ago and he still gets pain and swelling in his jaw off and on.    HPI JOEPH VIDAL presents for   HTN: Takes Bp at home every other day. Has been with normal per report. Patient does work full time cleaning a facility.  TIA: patient states it has been over a year. States he had no sympotms but was told that he had a TIA.  Alcohol use: Beer wine a liqour. Drink 1-2 12 oz beer daily then will sometimes take a shot of liquor .  Weight loss: had a couple teeth pulled 1-2 months ago. States that his gums and jaw hurts. States that he had difficulty eating with intermittent pain. States he use to have a partial but lost it.  Smoking Quit apprx 20 years. 0.25 ppd Past Medical History:  Diagnosis Date   Chicken pox    Hypertension    Right eye injury    pt has glass eye   TIA (transient ischemic attack) 2017    Past Surgical History:  Procedure Laterality Date   ABDOMINAL SURGERY     AMPUTATION FINGER / THUMB     left   COLONOSCOPY  10/31/2011   Procedure: COLONOSCOPY;  Surgeon: Owens Loffler, MD;  Location: [facility];  Service: Endoscopy;  Laterality: N/A;   ENUCLEATION     glass eye   HERNIA REPAIR      Family History  Problem Relation Age of Onset   Hypertension Mother    CVA Father    Hypertension Sister    Hypertension Sister    Diabetes Brother    Cancer Neg Hx    Heart disease Neg Hx    Stroke Neg Hx     Social History   Socioeconomic History   Marital status: Widowed    Spouse name: Bonnita Nasuti   Number of children: 7   Years of education: Not on file   Highest education level: Not on file  Occupational History   Occupation: Retired Building control surveyor, now doing Secretary/administrator  Tobacco Use   Smoking status: Former     Packs/day: 1.50    Years: 20.00    Pack years: 30.00    Types: Cigarettes    Quit date: 09/09/1997    Years since quitting: 23.7   Smokeless tobacco: Never  Vaping Use   Vaping Use: Never used  Substance and Sexual Activity   Alcohol use: Yes    Alcohol/week: 1.0 standard drink    Types: 1 Cans of beer per week    Comment: half to a whole beer - occasionally (social)   Drug use: No   Sexual activity: Not on file  Other Topics Concern   Not on file  Social History Narrative   Lives with wife at home   Social Determinants of Health   Financial Resource Strain: Not on file  Food Insecurity: Not on file  Transportation Needs: Not on file  Physical Activity: Not on file  Stress: Not on file  Social Connections: Not on file  Intimate Partner Violence: Not on file    Outpatient Medications Prior to Visit  Medication Sig Dispense Refill   amLODipine (NORVASC) 10 MG tablet TAKE 1 TABLET  BY MOUTH EVERY DAY 30 tablet 0   aspirin 81 MG tablet Take 81 mg by mouth daily.     multivitamin (ONE-A-DAY MEN'S) TABS tablet Take 1 tablet by mouth daily with breakfast.     sildenafil (VIAGRA) 50 MG tablet TAKE 1/2 TO 1 (ONE-HALF TO ONE) TABLET BY MOUTH  DAILY AS NEEDED FOR ERECTILE DYSFUNCTION (Patient taking differently: Take 25-50 mg by mouth daily as needed for erectile dysfunction.) 10 tablet 5   valsartan-hydrochlorothiazide (DIOVAN-HCT) 320-25 MG tablet TAKE 1 TABLET BY MOUTH EVERY DAY 90 tablet 0   No facility-administered medications prior to visit.    No Known Allergies  ROS Review of Systems  Constitutional:  Negative for chills and fever.  HENT:  Positive for dental problem.   Respiratory:  Negative for shortness of breath.   Cardiovascular:  Negative for chest pain, palpitations and leg swelling.  Gastrointestinal:  Negative for constipation, diarrhea, nausea and vomiting.  Neurological:  Negative for numbness.  Psychiatric/Behavioral:  Negative for hallucinations and  suicidal ideas.      Objective:    Physical Exam Vitals and nursing note reviewed.  Constitutional:      Appearance: Normal appearance. He is normal weight.  HENT:     Mouth/Throat:     Mouth: Mucous membranes are moist.     Dentition: Abnormal dentition. Does not have dentures. Dental caries present.     Comments: Patient has had dental work done. Tenderness to right midline upper gum. Palpable know. Cardiovascular:     Rate and Rhythm: Normal rate and regular rhythm.  Pulmonary:     Effort: Pulmonary effort is normal.     Breath sounds: Normal breath sounds.  Abdominal:     General: Bowel sounds are normal.  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  Neurological:     Mental Status: He is alert.  Psychiatric:        Mood and Affect: Mood normal.        Behavior: Behavior normal.        Thought Content: Thought content normal.        Judgment: Judgment normal.   BP (!) 148/74   Pulse 63   Temp 98.3 F (36.8 C)   Resp 14   Ht '5\' 10"'$  (1.778 m)   Wt 169 lb 1 oz (76.7 kg)   SpO2 96%   BMI 24.26 kg/m  Wt Readings from Last 3 Encounters:  05/25/21 169 lb 1 oz (76.7 kg)  03/16/20 193 lb (87.5 kg)  03/08/20 195 lb (88.5 kg)     Health Maintenance Due  Topic Date Due   Zoster Vaccines- Shingrix (1 of 2) Never done   PNA vac Low Risk Adult (2 of 2 - PPSV23) 06/03/2017   INFLUENZA VACCINE  04/09/2021    There are no preventive care reminders to display for this patient.  Lab Results  Component Value Date   TSH 0.76 11/01/2013   Lab Results  Component Value Date   WBC 4.7 03/18/2021   HGB 13.1 03/18/2021   HCT 38.1 (L) 03/18/2021   MCV 96.0 03/18/2021   PLT 200 03/18/2021   Lab Results  Component Value Date   NA 141 03/18/2021   K 3.5 03/18/2021   CO2 24 03/18/2021   GLUCOSE 96 03/18/2021   BUN 11 03/18/2021   CREATININE 1.04 03/18/2021   BILITOT 0.8 03/18/2021   ALKPHOS 66 03/18/2021  AST 22 03/18/2021   ALT 17 03/18/2021   PROT 7.3 03/18/2021   ALBUMIN 4.1 03/18/2021   CALCIUM 9.1 03/18/2021   ANIONGAP 9 03/18/2021   GFR 66.22 03/16/2020   Lab Results  Component Value Date   CHOL 184 03/16/2020   Lab Results  Component Value Date   HDL 98.80 03/16/2020   Lab Results  Component Value Date   LDLCALC 68 03/16/2020   Lab Results  Component Value Date   TRIG 87.0 03/16/2020   Lab Results  Component Value Date   CHOLHDL 2 03/16/2020   Lab Results  Component Value Date   HGBA1C 5.9 06/03/2016      Assessment & Plan:   Problem List Items Addressed This Visit       Cardiovascular and Mediastinum   HTN (hypertension) - Primary    Currently maintained on amlodipine and valsartan-HCTZ.  Patient states he is taking the medications as prescribed and denies any adverse drug events.  Patient does check his blood pressure about every other day at home.  Says he writes them down on occasion but does not member the numbers currently.  Continue checking blood pressure at home approximately every other day.  Record blood pressures for monitoring. Continue amlodipine and valsartan-HCTZ as prescribed.      Relevant Medications   amLODipine (NORVASC) 10 MG tablet   Other Relevant Orders   CBC   Comprehensive metabolic panel   TIA (transient ischemic attack)    According to patient's report he did not experience symptoms with his TIA just told by medical professional he had 1.  Currently taking a baby aspirin daily.  Continue taking baby aspirin work on blood pressure control and will check lipids in office.  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  Mentions that he is always at home and drinking.  States sometimes he Work-up To.  Did  Encourage Patient to Not Mix Alcohol Use and Limit to 2 Drinks a Day.  Discussed Risk of Liver Involvement, Stomach Involvement, Lipid involvement.        Other   Pain, dental    Has had recent dental work done to upper front teeth.  Patient has had a hard time eating ever since due to pain.  States the pain is intermittent.  We will treat for dental abscess as he is tender and a knot was palpated on upper gumline.  Patient was instructed to follow-up with dentist as he still has some dental caries.  States he is to have a partial denture that he has lost this also is affecting his ability to eat.  Continue to monitor weight      Relevant Medications   penicillin v potassium (VEETID) 500 MG tablet   Other Visit Diagnoses     Screening for prostate cancer       Relevant Orders   PSA, Medicare   Lipid panel   Need for influenza vaccination       Relevant Orders   Flu Vaccine QUAD High Dose(Fluad) (Completed)       No orders of the defined types were placed in this encounter.  This visit occurred during the SARS-CoV-2 public health emergency.  Safety protocols were in  place, including screening questions prior to the visit, additional usage of staff PPE, and extensive cleaning of exam room while observing appropriate contact time as indicated for disinfecting solutions.   Follow-up: Return in about 2 months (around 07/25/2021) for annual wellness.    Romilda Garret, NP

## 2021-05-25 NOTE — Assessment & Plan Note (Signed)
According to patient's report he did not experience symptoms with his TIA just told by medical professional he had 1.  Currently taking a baby aspirin daily.  Continue taking baby aspirin work on blood pressure control and will check lipids in office.  Pending lab results

## 2021-05-30 ENCOUNTER — Other Ambulatory Visit: Payer: Self-pay | Admitting: Nurse Practitioner

## 2021-05-30 DIAGNOSIS — R972 Elevated prostate specific antigen [PSA]: Secondary | ICD-10-CM

## 2021-08-31 ENCOUNTER — Telehealth: Payer: Self-pay | Admitting: Nurse Practitioner

## 2021-08-31 ENCOUNTER — Other Ambulatory Visit: Payer: Self-pay

## 2021-08-31 ENCOUNTER — Ambulatory Visit (INDEPENDENT_AMBULATORY_CARE_PROVIDER_SITE_OTHER): Payer: Medicare HMO | Admitting: Nurse Practitioner

## 2021-08-31 VITALS — BP 200/108 | HR 52 | Temp 97.9°F | Resp 12 | Ht 70.0 in | Wt 175.0 lb

## 2021-08-31 DIAGNOSIS — R972 Elevated prostate specific antigen [PSA]: Secondary | ICD-10-CM | POA: Diagnosis not present

## 2021-08-31 DIAGNOSIS — I10 Essential (primary) hypertension: Secondary | ICD-10-CM | POA: Diagnosis not present

## 2021-08-31 DIAGNOSIS — Z1211 Encounter for screening for malignant neoplasm of colon: Secondary | ICD-10-CM

## 2021-08-31 DIAGNOSIS — Z0001 Encounter for general adult medical examination with abnormal findings: Secondary | ICD-10-CM

## 2021-08-31 DIAGNOSIS — Z7189 Other specified counseling: Secondary | ICD-10-CM | POA: Diagnosis not present

## 2021-08-31 DIAGNOSIS — Z0000 Encounter for general adult medical examination without abnormal findings: Secondary | ICD-10-CM | POA: Insufficient documentation

## 2021-08-31 LAB — COMPREHENSIVE METABOLIC PANEL
ALT: 12 U/L (ref 0–53)
AST: 16 U/L (ref 0–37)
Albumin: 4.4 g/dL (ref 3.5–5.2)
Alkaline Phosphatase: 54 U/L (ref 39–117)
BUN: 19 mg/dL (ref 6–23)
CO2: 33 mEq/L — ABNORMAL HIGH (ref 19–32)
Calcium: 10.2 mg/dL (ref 8.4–10.5)
Chloride: 104 mEq/L (ref 96–112)
Creatinine, Ser: 1.17 mg/dL (ref 0.40–1.50)
GFR: 61.8 mL/min (ref >60.00)
Glucose, Bld: 99 mg/dL (ref 70–99)
Potassium: 4 mEq/L (ref 3.5–5.1)
Sodium: 141 mEq/L (ref 135–145)
Total Bilirubin: 1.1 mg/dL (ref 0.2–1.2)
Total Protein: 7.2 g/dL (ref 6.0–8.3)

## 2021-08-31 LAB — PSA, MEDICARE: PSA: 3.77 ng/ml (ref 0.10–4.00)

## 2021-08-31 NOTE — Assessment & Plan Note (Addendum)
Reviewed Medicare annual wellness paperwork with patient sent to medical records for scanning.  Did discuss advanced directives with patient and gave him Living Will handout.

## 2021-08-31 NOTE — Assessment & Plan Note (Signed)
Finding from earlier office visit.  Referral for urology made patient never made contact with anyone we will recheck PSA today if still elevated he will need to see urology.  Patient on board with plan

## 2021-08-31 NOTE — Assessment & Plan Note (Signed)
Patient's blood pressure uncontrolled in office.  Asymptomatic.  EKG okay today showed some bradycardia.  Has been off of his Diovan-HCTZ.  Instructed him to get back on medication continue checking his blood pressure and follow-up in few weeks with me to make sure blood pressure has normalized.  Pending lab results

## 2021-08-31 NOTE — Patient Instructions (Addendum)
Nice to see you today Go by the pharmacy and see if you cannot get your date of birth fixed with them. You should be on two blood pressure pills called: Amlodipine 10mg  and Valsartan-Hydrochlorothiazide 320mg -25mg .  I want you to come back in 2 weeks to recheck your blood pressure after you have been on the medicine Also take your blood pressure daily until I see you again. Write those numbers down and bring them to the office visit.

## 2021-08-31 NOTE — Telephone Encounter (Signed)
Patient was seen last year by Cardiology and was suppose to have a follow up. Can we let him know of reach out to the cardiology office and let then know?  Thanks

## 2021-08-31 NOTE — Assessment & Plan Note (Signed)
Gust different avenues of advanced directives including living will, healthcare power of attorney, DURABLE POWER OF ATTORNEY.  Gave handout to patient

## 2021-08-31 NOTE — Progress Notes (Signed)
Established Patient Office Visit  Subjective:  Patient ID: Terry Russo, male    DOB: 05/18/1946  Age: 75 y.o. MRN: [id number]  CC:  Chief Complaint  Patient presents with   Medicare Wellness    HPI Terry Russo presents for Medicare annual wellness for complete physical and follow up of chronic conditions.  Immunizations: -Tetanus: 11/01/2013 -Influenza: 05/25/2021 -Covid-19: 06/26/2020, 09/17/2019, 02/21/2021 -Shingles: -Pneumonia: 10/18/2011, 06/03/2016  -HPV: Aged out  Diet: [place]. 3 times a day. Water, coffee, tea. Does drink beer sometimes 2-3  Exercise: No regular exercise. Works at home. Does ride bike in warmer weather rides about an hour  Eye exam: Completes annually. Wears corrective lense  Dental exam: Completes semi-annually.      Dexa: Never done Colonoscopy: Completed in 2013 order toady PSA: Checked will recheck as he has not seen urology yet  Lung Cancer Screening: NA  HTN: Has the ability to check blood pressure at home but does not. Has been only taking one blood pressure (amlodipine) states that that is the only one the pharmacy has for him. Did call the pharmacy and they had his DOB wrong. Patient to go and get this fixed with pharmacy and start taking appropriate BP medications  Fall Risk 02/10/2014 06/03/2016 02/09/2020 03/16/2020 05/25/2021  Falls in the past year? No No - 0 0  Was there an injury with Fall? - - - - 0  Fall Risk Category Calculator - - - - 0  Fall Risk Category - - - - Low  Patient Fall Risk Level - - Low fall risk - Low fall risk   PHQ9 SCORE ONLY 08/31/2021 05/25/2021 03/16/2020  PHQ-9 Total Score 0 0 0   .   Hearing Screening   500Hz  1000Hz  2000Hz  4000Hz   Right ear 0 0 25 40  Left ear 0 0 40 40  Vision Screening - Comments:: Had eye exam around May 20[facility]  Past Medical History:  Diagnosis Date   Chicken pox    Hypertension    Right eye injury    pt has glass eye   TIA (transient ischemic attack)  2017    Past Surgical History:  Procedure Laterality Date   ABDOMINAL SURGERY     AMPUTATION FINGER / THUMB     left   COLONOSCOPY  10/31/2011   Procedure: COLONOSCOPY;  Surgeon: Owens Loffler, MD;  Location: [facility];  Service: Endoscopy;  Laterality: N/A;   ENUCLEATION     glass eye   HERNIA REPAIR      Family History  Problem Relation Age of Onset   Hypertension Mother    CVA Father    Hypertension Sister    Hypertension Sister    Diabetes Brother    Cancer Neg Hx    Heart disease Neg Hx    Stroke Neg Hx     Social History   Socioeconomic History   Marital status: Widowed    Spouse name: Bonnita Nasuti   Number of children: 7   Years of education: Not on file   Highest education level: Not on file  Occupational History   Occupation: Retired Building control surveyor, now doing Secretary/administrator  Tobacco Use   Smoking status: Former    Packs/day: 1.50    Years: 20.00    Pack years: 30.00    Types: Cigarettes    Quit date: 09/09/1997    Years since quitting: 23.9   Smokeless tobacco: Never  Vaping Use   Vaping Use: Never  used  Substance and Sexual Activity   Alcohol use: Yes    Alcohol/week: 1.0 standard drink    Types: 1 Cans of beer per week    Comment: half to a whole beer - occasionally (social)   Drug use: No   Sexual activity: Not on file  Other Topics Concern   Not on file  Social History Narrative   Lives with wife at home   Social Determinants of Health   Financial Resource Strain: Not on file  Food Insecurity: Not on file  Transportation Needs: Not on file  Physical Activity: Not on file  Stress: Not on file  Social Connections: Not on file  Intimate Partner Violence: Not on file    Outpatient Medications Prior to Visit  Medication Sig Dispense Refill   amLODipine (NORVASC) 10 MG tablet Take 1 tablet (10 mg total) by mouth daily. 90 tablet 3   aspirin 81 MG tablet Take 81 mg by mouth daily.     multivitamin (ONE-A-DAY MEN'S) TABS tablet Take 1 tablet by  mouth daily with breakfast.     sildenafil (VIAGRA) 50 MG tablet TAKE 1/2 TO 1 (ONE-HALF TO ONE) TABLET BY MOUTH  DAILY AS NEEDED FOR ERECTILE DYSFUNCTION (Patient not taking: Reported on 08/31/2021) 10 tablet 5   valsartan-hydrochlorothiazide (DIOVAN-HCT) 320-25 MG tablet TAKE 1 TABLET BY MOUTH EVERY DAY (Patient not taking: Reported on 08/31/2021) 90 tablet 0   No facility-administered medications prior to visit.    No Known Allergies  ROS Review of Systems  Constitutional:  Negative for chills and fever.  HENT:         Wears corrective lenses and has right eye prosthesis   Eyes:  Negative for visual disturbance.  Respiratory:  Negative for cough and shortness of breath.   Cardiovascular:  Negative for chest pain and leg swelling.  Gastrointestinal:  Negative for diarrhea, nausea and vomiting.  Genitourinary:  Negative for dysuria and hematuria.       Nocturia x 2  Neurological:  Negative for dizziness, weakness, light-headedness, numbness and headaches.  Psychiatric/Behavioral:  Negative for hallucinations and suicidal ideas.      Objective:    Physical Exam Vitals and nursing note reviewed.  Constitutional:      Appearance: Normal appearance.  HENT:     Right Ear: Tympanic membrane, ear canal and external ear normal.     Left Ear: Tympanic membrane, ear canal and external ear normal.     Mouth/Throat:     Mouth: Mucous membranes are moist.     Pharynx: Oropharynx is clear.  Eyes:     Extraocular Movements: Extraocular movements intact.     Pupils: Pupils are equal, round, and reactive to light.     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     Pulses: Normal pulses.     Heart sounds: Normal heart sounds.  Pulmonary:     Effort: Pulmonary effort is normal.     Breath sounds: Normal breath sounds.  Abdominal:     General: Bowel sounds are normal. There is no distension.     Palpations: There is no mass.      Tenderness: There is no abdominal tenderness.  Musculoskeletal:     Right lower leg: No edema.     Left lower leg: No edema.  Skin:    General: Skin is warm.  Neurological:     General: No focal deficit present.  Mental Status: He is alert. Mental status is at baseline.  Psychiatric:        Mood and Affect: Mood normal.        Behavior: Behavior normal.        Thought Content: Thought content normal.        Judgment: Judgment normal.    BP (!) 200/108    Pulse (!) 52    Temp 97.9 F (36.6 C)    Resp 12    Ht 5\' 10"  (1.778 m)    Wt 175 lb (79.4 kg)    SpO2 96%    BMI 25.11 kg/m  Wt Readings from Last 3 Encounters:  08/31/21 175 lb (79.4 kg)  05/25/21 169 lb 1 oz (76.7 kg)  03/16/20 193 lb (87.5 kg)     Health Maintenance Due  Topic Date Due   Zoster Vaccines- Shingrix (1 of 2) Never done   Pneumonia Vaccine 53+ Years old (61) 06/03/2017   COVID-19 Vaccine (4 - Booster for Pfizer series) 04/18/2021    There are no preventive care reminders to display for this patient.  Lab Results  Component Value Date   TSH 0.76 11/01/2013   Lab Results  Component Value Date   WBC 2.6 (L) 05/25/2021   HGB 13.4 05/25/2021   HCT 39.9 05/25/2021   MCV 97.2 05/25/2021   PLT 154.0 05/25/2021   Lab Results  Component Value Date   NA 142 05/25/2021   K 3.8 05/25/2021   CO2 32 05/25/2021   GLUCOSE 86 05/25/2021   BUN 15 05/25/2021   CREATININE 1.15 05/25/2021   BILITOT 0.5 05/25/2021   ALKPHOS 53 05/25/2021   AST 14 05/25/2021   ALT 11 05/25/2021   PROT 7.0 05/25/2021   ALBUMIN 4.2 05/25/2021   CALCIUM 9.8 05/25/2021   ANIONGAP 9 03/18/2021   GFR 63.21 05/25/2021   Lab Results  Component Value Date   CHOL 162 05/25/2021   Lab Results  Component Value Date   HDL 107.40 05/25/2021   Lab Results  Component Value Date   LDLCALC 38 05/25/2021   Lab Results  Component Value Date   TRIG 82.0 05/25/2021   Lab Results  Component Value Date   CHOLHDL 2 05/25/2021    Lab Results  Component Value Date   HGBA1C 5.9 06/03/2016      Assessment & Plan:   Problem List Items Addressed This Visit       Cardiovascular and Mediastinum   Uncontrolled hypertension    Patient's blood pressure uncontrolled in office.  Asymptomatic.  EKG okay today showed some bradycardia.  Has been off of his Diovan-HCTZ.  Instructed him to get back on medication continue checking his blood pressure and follow-up in few weeks with me to make sure blood pressure has normalized.  Pending lab results      Relevant Orders   Comprehensive metabolic panel (Completed)   EKG 12-Lead (Completed)     Other   Medicare annual wellness visit, subsequent - Primary    Reviewed Medicare annual wellness paperwork with patient sent to medical records for scanning.  Did discuss advanced directives with patient and gave him Living Will handout.      Elevated PSA    Finding from earlier office visit.  Referral for urology made patient never made contact with anyone we will recheck PSA today if still elevated he will need to see urology.  Patient on board with plan      Relevant Orders   PSA,  Medicare (Completed)   Screening for colon cancer   Relevant Orders   Ambulatory referral to Gastroenterology   Advanced directives, counseling/discussion    Gust different avenues of advanced directives including living will, healthcare power of attorney, DURABLE POWER OF ATTORNEY.  Gave handout to patient       No orders of the defined types were placed in this encounter.   Follow-up: Return in about 2 weeks (around 09/14/2021) for BP recheck.  This visit occurred during the SARS-CoV-2 public health emergency.  Safety protocols were in place, including screening questions prior to the visit, additional usage of staff PPE, and extensive cleaning of exam room while observing appropriate contact time as indicated for disinfecting solutions.     Romilda Garret, NP

## 2021-09-04 NOTE — Telephone Encounter (Signed)
Left message for patient to call back  

## 2021-09-04 NOTE — Telephone Encounter (Signed)
Also need to give patient lab results

## 2021-09-06 MED ORDER — VALSARTAN-HYDROCHLOROTHIAZIDE 320-25 MG PO TABS: 1.0000 | ORAL_TABLET | Freq: Every day | ORAL | 2 refills | Status: DC

## 2021-09-06 NOTE — Telephone Encounter (Signed)
Patient advised and verbalized understanding about calling to follow up.

## 2021-09-06 NOTE — Addendum Note (Signed)
Addended by: Kris Mouton on: 09/06/2021 09:42 AM   Modules accepted: Orders

## 2021-11-23 ENCOUNTER — Ambulatory Visit: Payer: Medicare HMO | Admitting: Nurse Practitioner

## 2021-11-26 ENCOUNTER — Ambulatory Visit (INDEPENDENT_AMBULATORY_CARE_PROVIDER_SITE_OTHER)
Admission: RE | Admit: 2021-11-26 | Discharge: 2021-11-26 | Disposition: A | Discharge status: Home / Self Care | Payer: Medicare HMO | Source: Ambulatory Visit | Attending: Nurse Practitioner | Admitting: Nurse Practitioner

## 2021-11-26 ENCOUNTER — Ambulatory Visit (INDEPENDENT_AMBULATORY_CARE_PROVIDER_SITE_OTHER): Payer: Medicare HMO | Admitting: Nurse Practitioner

## 2021-11-26 ENCOUNTER — Other Ambulatory Visit: Payer: Self-pay

## 2021-11-26 VITALS — BP 174/94 | HR 64 | Temp 97.3°F | Resp 12 | Ht 70.0 in | Wt 172.0 lb

## 2021-11-26 DIAGNOSIS — M533 Sacrococcygeal disorders, not elsewhere classified: Secondary | ICD-10-CM | POA: Diagnosis not present

## 2021-11-26 DIAGNOSIS — M5431 Sciatica, right side: Secondary | ICD-10-CM | POA: Diagnosis not present

## 2021-11-26 DIAGNOSIS — M2578 Osteophyte, vertebrae: Secondary | ICD-10-CM | POA: Diagnosis not present

## 2021-11-26 DIAGNOSIS — I10 Essential (primary) hypertension: Secondary | ICD-10-CM | POA: Diagnosis not present

## 2021-11-26 DIAGNOSIS — M5440 Lumbago with sciatica, unspecified side: Secondary | ICD-10-CM | POA: Diagnosis not present

## 2021-11-26 DIAGNOSIS — M545 Low back pain, unspecified: Secondary | ICD-10-CM | POA: Diagnosis not present

## 2021-11-26 DIAGNOSIS — M47817 Spondylosis without myelopathy or radiculopathy, lumbosacral region: Secondary | ICD-10-CM | POA: Diagnosis not present

## 2021-11-26 MED ORDER — AMLODIPINE BESYLATE 10 MG PO TABS: 10.0000 mg | ORAL_TABLET | Freq: Every day | ORAL | 3 refills | Status: DC

## 2021-11-26 MED ORDER — PREDNISONE 20 MG PO TABS: ORAL_TABLET | ORAL | 0 refills | Status: AC

## 2021-11-26 MED ORDER — VALSARTAN-HYDROCHLOROTHIAZIDE 320-25 MG PO TABS: 1.0000 | ORAL_TABLET | Freq: Every day | ORAL | 2 refills | Status: DC

## 2021-11-26 NOTE — Assessment & Plan Note (Signed)
Patient currently taking amlodipine 10 mg.  Patient supposed to be on that plus Diovan-HCTZ.  We will resend both prescriptions into different [facility] pharmacy.  Encourage patient he needs to check his blood pressure at home while starting these medications.  We will have him follow-up in office in 1 month for blood pressure recheck and BMP to make sure potassium and kidneys are doing okay with restart of Diovan ?

## 2021-11-26 NOTE — Patient Instructions (Signed)
Nice to see you today ?I have sent in both blood pressure medications to the pharmacy ?I have sent in some steroids too. ?I will be in touch with the xray results. Likely 2 days before I get them back ?Follow up if not better.  ? ?Need to see you in a month to make sure your blood pressure gets better ?Get batteries for your at home cuff too. ?

## 2021-11-26 NOTE — Assessment & Plan Note (Signed)
Straight leg raise negative but given patient's description and symptoms will elect to treat sciatica pain.  We will start him on prednisone taper with advised to avoid NSAIDs.  Also update lumbar picture pending result patient also given at home exercises to help alleviate discomfort and pain ?

## 2021-11-26 NOTE — Assessment & Plan Note (Signed)
Tenderness to palpation of SI joint pending x-ray start prednisone taper.  May use Tylenol as needed ?

## 2021-11-26 NOTE — Progress Notes (Signed)
Acute Office Visit  Subjective:    Patient ID: Terry Russo, male    DOB: [birth year], 76 y.o.   MRN: [id number]  Chief Complaint  Patient presents with   Leg Pain    Right leg, right lower back, right thigh area. Started about 1 month ago. Off and on. Pain described as achy sensation. Has taking Advil some and it helped a little. Does get some tingling sensation in his leg off and on. No falls.     Patient is in today for Leg pain  Right leg pain that started approx 1 month Advil over the counter with some relief.  Pain is intermittent. Described as an aching pain and sometimes sharp. Worse with weight bearing. No recent falls, states history of back issues but no surgery. No weakness. Not an asleep feeling.  Last back x-ray was 2016.  Report reviewed  HTN: states he is only taking one medicatoin. States toruble getting the medication from the pharmcy. Can check bp at home but has not lately due to needing replacement batteries for his blood pressure cuff  Past Medical History:  Diagnosis Date   Chicken pox    Hypertension    Right eye injury    pt has glass eye   TIA (transient ischemic attack) 2017    Past Surgical History:  Procedure Laterality Date   ABDOMINAL SURGERY     AMPUTATION FINGER / THUMB     left   COLONOSCOPY  10/31/2011   Procedure: COLONOSCOPY;  Surgeon: Rob Bunting, MD;  Location: [facility];  Service: Endoscopy;  Laterality: N/A;   ENUCLEATION     glass eye   HERNIA REPAIR      Family History  Problem Relation Age of Onset   Hypertension Mother    CVA Father    Hypertension Sister    Hypertension Sister    Diabetes Brother    Cancer Neg Hx    Heart disease Neg Hx    Stroke Neg Hx     Social History   Socioeconomic History   Marital status: Widowed    Spouse name: Myriam Jacobson   Number of children: 7   Years of education: Not on file   Highest education level: Not on file  Occupational History   Occupation: Retired Psychologist, occupational, now doing  Chiropractor  Tobacco Use   Smoking status: Former    Packs/day: 1.50    Years: 20.00    Pack years: 30.00    Types: Cigarettes    Quit date: 09/09/1997    Years since quitting: 24.2   Smokeless tobacco: Never  Vaping Use   Vaping Use: Never used  Substance and Sexual Activity   Alcohol use: Yes    Alcohol/week: 1.0 standard drink    Types: 1 Cans of beer per week    Comment: half to a whole beer - occasionally (social)   Drug use: No   Sexual activity: Not on file  Other Topics Concern   Not on file  Social History Narrative   Lives with wife at home   Social Determinants of Health   Financial Resource Strain: Not on file  Food Insecurity: Not on file  Transportation Needs: Not on file  Physical Activity: Not on file  Stress: Not on file  Social Connections: Not on file  Intimate Partner Violence: Not on file    Outpatient Medications Prior to Visit  Medication Sig Dispense Refill   amLODipine (NORVASC) 10 MG tablet Take 1 tablet (10  mg total) by mouth daily. 90 tablet 3   aspirin 81 MG tablet Take 81 mg by mouth daily.     multivitamin (ONE-A-DAY MEN'S) TABS tablet Take 1 tablet by mouth daily with breakfast.     sildenafil (VIAGRA) 50 MG tablet TAKE 1/2 TO 1 (ONE-HALF TO ONE) TABLET BY MOUTH  DAILY AS NEEDED FOR ERECTILE DYSFUNCTION 10 tablet 5   valsartan-hydrochlorothiazide (DIOVAN-HCT) 320-25 MG tablet Take 1 tablet by mouth daily. (Patient not taking: Reported on 11/26/2021) 90 tablet 2   No facility-administered medications prior to visit.    No Known Allergies  Review of Systems  Constitutional:  Negative for chills and fever.  Genitourinary:        No B&B involvement   Musculoskeletal:  Positive for back pain. Negative for joint swelling.  Neurological:  Positive for numbness. Negative for weakness.      Objective:    Physical Exam Vitals and nursing note reviewed.  Constitutional:      Appearance: Normal appearance.  Cardiovascular:      Rate and Rhythm: Normal rate and regular rhythm.     Heart sounds: Normal heart sounds.  Pulmonary:     Effort: Pulmonary effort is normal.     Breath sounds: Normal breath sounds.  Abdominal:     General: Bowel sounds are normal.  Musculoskeletal:        General: Tenderness present. No signs of injury.     Lumbar back: No tenderness. Negative right straight leg raise test and negative left straight leg raise test.       Back:     Right lower leg: No edema.     Left lower leg: No edema.  Neurological:     General: No focal deficit present.     Mental Status: He is alert.     Comments: Bilateral lower extremity strength 5/5    BP (!) 174/94   Pulse 64   Temp (!) 97.3 F (36.3 C)   Resp 12   Ht 5\' 10"  (1.778 m)   Wt 172 lb (78 kg)   SpO2 98%   BMI 24.68 kg/m  Wt Readings from Last 3 Encounters:  11/26/21 172 lb (78 kg)  08/31/21 175 lb (79.4 kg)  05/25/21 169 lb 1 oz (76.7 kg)    Health Maintenance Due  Topic Date Due   Zoster Vaccines- Shingrix (1 of 2) Never done   Pneumonia Vaccine 37+ Years old (3) 06/03/2017   COVID-19 Vaccine (4 - Booster for Pfizer series) 04/18/2021   COLONOSCOPY (Pts 45-110yrs Insurance coverage will need to be confirmed)  10/30/2021    There are no preventive care reminders to display for this patient.   Lab Results  Component Value Date   TSH 0.76 11/01/2013   Lab Results  Component Value Date   WBC 2.6 (L) 05/25/2021   HGB 13.4 05/25/2021   HCT 39.9 05/25/2021   MCV 97.2 05/25/2021   PLT 154.0 05/25/2021   Lab Results  Component Value Date   NA 141 08/31/2021   K 4.0 08/31/2021   CO2 33 (H) 08/31/2021   GLUCOSE 99 08/31/2021   BUN 19 08/31/2021   CREATININE 1.17 08/31/2021   BILITOT 1.1 08/31/2021   ALKPHOS 54 08/31/2021   AST 16 08/31/2021   ALT 12 08/31/2021   PROT 7.2 08/31/2021   ALBUMIN 4.4 08/31/2021   CALCIUM 10.2 08/31/2021   ANIONGAP 9 03/18/2021   GFR 61.80 08/31/2021   Lab Results  Component Value Date  CHOL 162 05/25/2021   Lab Results  Component Value Date   HDL 107.40 05/25/2021   Lab Results  Component Value Date   LDLCALC 38 05/25/2021   Lab Results  Component Value Date   TRIG 82.0 05/25/2021   Lab Results  Component Value Date   CHOLHDL 2 05/25/2021   Lab Results  Component Value Date   HGBA1C 5.9 06/03/2016       Assessment & Plan:   Problem List Items Addressed This Visit       Cardiovascular and Mediastinum   Uncontrolled hypertension    Patient currently taking amlodipine 10 mg.  Patient supposed to be on that plus Diovan-HCTZ.  We will resend both prescriptions [facility].  Encourage patient he needs to check his blood pressure at home while starting these medications.  We will have him follow-up in office in 1 month for blood pressure recheck and BMP to make sure potassium and kidneys are doing okay with restart of Diovan      Relevant Medications   valsartan-hydrochlorothiazide (DIOVAN-HCT) 320-25 MG tablet   amLODipine (NORVASC) 10 MG tablet     Nervous and Auditory   Sciatica of right side    Straight leg raise negative but given patient's description and symptoms will elect to treat sciatica pain.  We will start him on prednisone taper with advised to avoid NSAIDs.  Also update lumbar picture pending result patient also given at home exercises to help alleviate discomfort and pain      Relevant Medications   predniSONE (DELTASONE) 20 MG tablet   Other Relevant Orders   DG Lumbar Spine 2-3 Views     Other   Sacroiliac joint pain - Primary    Tenderness to palpation of SI joint pending x-ray start prednisone taper.  May use Tylenol as needed      Relevant Medications   predniSONE (DELTASONE) 20 MG tablet   Other Relevant Orders   DG Lumbar Spine 2-3 Views     No orders of the defined types were placed in this encounter.  This visit occurred during the SARS-CoV-2 public health emergency.  Safety protocols were in place,  including screening questions prior to the visit, additional usage of staff PPE, and extensive cleaning of exam room while observing appropriate contact time as indicated for disinfecting solutions.   Audria Nine, NP

## 2021-12-05 ENCOUNTER — Other Ambulatory Visit: Payer: Self-pay | Admitting: Nurse Practitioner

## 2021-12-05 ENCOUNTER — Telehealth: Payer: Self-pay | Admitting: Nurse Practitioner

## 2021-12-05 DIAGNOSIS — M533 Sacrococcygeal disorders, not elsewhere classified: Secondary | ICD-10-CM

## 2021-12-05 NOTE — Telephone Encounter (Signed)
Referral placed.

## 2021-12-05 NOTE — Telephone Encounter (Signed)
Left message for daughter to call back ?If im not able to speak with her I just need to know if patient would like to proceed with been referred to orthopedic specialist and if so, does he have a preference on location-[place], [place], etc. ? ?

## 2021-12-05 NOTE — Telephone Encounter (Signed)
Pt daughter called stating that pt would like the referral in [place]. ?

## 2021-12-05 NOTE — Telephone Encounter (Signed)
Pt daughter called asking you to give her a call back. ?

## 2021-12-19 ENCOUNTER — Ambulatory Visit (INDEPENDENT_AMBULATORY_CARE_PROVIDER_SITE_OTHER): Payer: Medicare HMO | Admitting: Surgery

## 2021-12-19 ENCOUNTER — Encounter: Payer: Self-pay | Admitting: Surgery

## 2021-12-19 DIAGNOSIS — M4726 Other spondylosis with radiculopathy, lumbar region: Secondary | ICD-10-CM

## 2021-12-27 ENCOUNTER — Ambulatory Visit: Payer: Medicare HMO | Admitting: Nurse Practitioner

## 2021-12-28 ENCOUNTER — Other Ambulatory Visit: Payer: Self-pay

## 2021-12-28 ENCOUNTER — Ambulatory Visit (AMBULATORY_SURGERY_CENTER): Payer: Medicare HMO

## 2021-12-28 VITALS — Ht 70.0 in | Wt 176.0 lb

## 2021-12-28 DIAGNOSIS — Z1211 Encounter for screening for malignant neoplasm of colon: Secondary | ICD-10-CM

## 2021-12-28 NOTE — Progress Notes (Signed)
?  Patient's pre-visit was done today over the phone with the patient  ? ?Name,DOB and address verified.  ?  ?Patient denies any allergies to Eggs and Soy.  ? ?Patient denies any problems with anesthesia/sedation. ? ?Patient denies taking diet pills or blood thinners.  ? ?Denies atrial flutter or atrial fib ? ?Denies chronic constipation ? ?No home Oxygen.  ? ?Packet of Prep instructions sent by My Chart or mail to patient including a copy of a consent form if by mail-pt is aware.  ? ?Patient understands to call us back with any questions or concerns.  ? ?Patient is aware of our care-partner policy and [id number] safety protocol.  ? ?Pt was distracted during PV ?  ? ?  ?

## 2022-01-01 ENCOUNTER — Ambulatory Visit (INDEPENDENT_AMBULATORY_CARE_PROVIDER_SITE_OTHER): Payer: Medicare HMO | Admitting: Physical Therapy

## 2022-01-01 ENCOUNTER — Encounter: Payer: Self-pay | Admitting: Physical Therapy

## 2022-01-01 DIAGNOSIS — M6281 Muscle weakness (generalized): Secondary | ICD-10-CM | POA: Diagnosis not present

## 2022-01-01 DIAGNOSIS — M5441 Lumbago with sciatica, right side: Secondary | ICD-10-CM | POA: Diagnosis not present

## 2022-01-01 DIAGNOSIS — R262 Difficulty in walking, not elsewhere classified: Secondary | ICD-10-CM | POA: Diagnosis not present

## 2022-01-01 NOTE — Therapy (Addendum)
?OUTPATIENT PHYSICAL THERAPY THORACOLUMBAR EVALUATION ? ? ?Patient Name: Terry Russo ?MRN: [id number] ?DOB:1945/11/20, 76 y.o., male ?Today's Date: 01/01/2022 ? ? PT End of Session - 01/01/22 0909   ? ? Visit Number 1   ? Number of Visits 13   ? Date for PT Re-Evaluation 02/15/22   ? Authorization - Visit Number 1   ? Authorization - Number of Visits 12   ? PT Start Time [phone]   ? PT Stop Time 0950   ? PT Time Calculation (min) 45 min   ? Activity Tolerance Patient tolerated treatment well   ? Behavior During Therapy [facility] for tasks assessed/performed   ? ?  ?  ? ?  ? ? ?Past Medical History:  ?Diagnosis Date  ? Chicken pox   ? Hypertension   ? Right eye injury   ? pt has glass eye  ? TIA (transient ischemic attack) 2017  ? ?Past Surgical History:  ?Procedure Laterality Date  ? ABDOMINAL SURGERY    ? AMPUTATION FINGER / THUMB    ? left  ? COLONOSCOPY  10/31/2011  ? Procedure: COLONOSCOPY;  Surgeon: Owens Loffler, MD;  Location: [facility];  Service: Endoscopy;  Laterality: N/A;  ? ENUCLEATION    ? glass eye  ? HERNIA REPAIR    ? ?Patient Active Problem List  ? Diagnosis Date Noted  ? Sciatica of right side 11/26/2021  ? Sacroiliac joint pain 11/26/2021  ? Medicare annual wellness visit, subsequent 08/31/2021  ? Elevated PSA 08/31/2021  ? Screening for colon cancer 08/31/2021  ? Advanced directives, counseling/discussion 08/31/2021  ? Pain, dental 05/25/2021  ? 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 Involuntary commitment   ? Erectile dysfunction 07/07/2019  ? TIA (transient ischemic attack) 06/03/2016  ? Uncontrolled hypertension 10/18/2011  ? ? ?PCP: Michela Pitcher, NP ? ?REFERRING PROVIDER: Lanae Crumbly, PA-C ? 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 ?THERAPY DIAG:  ?Acute right-sided low back pain with right-sided sciatica ? ?Muscle weakness (generalized) ? ?Difficulty in walking, not elsewhere classified ? ?ONSET DATE: 3 months ago ? ?SUBJECTIVE:                                                                                                                                                                                           ? ?SUBJECTIVE STATEMENT: ?Pt arriving today reporting 7/10 low back pain with radiation down Rt LE.  ?Pt reporting difficulty sleeping at times where the pain wakes him up at night.  ?Pt stating he has been taking over the counter pain medications. ? ?  ?PERTINENT HISTORY:  ?TIA,  amputation finger and thumb, abdominal surgery, ETOH ? ?PAIN:  ?Are you having pain? Yes: NPRS scale: 7/10 ?Pain location: low back on Rt side ?Pain description: achy, burning ?Aggravating factors: sitting prolonged ?Relieving factors: over the counter pain meds, massage ? ? ?PRECAUTIONS: None ? ?WEIGHT BEARING RESTRICTIONS No ? ?FALLS:  ?Has patient fallen in last 6 months? No ? ?LIVING ENVIRONMENT: ?Lives with: lives with their family and lives with an adult companion ?Lives in: House/apartment ?Stairs: Yes: Internal: 14 steps; on left going up ?Has following equipment at home: None ? ?OCCUPATION: works at [employer] ([employer] ? ?PLOF: Independent ? ?PATIENT GOALS :  Stop hurting, get back to gym ? ? ?OBJECTIVE:  ? ?DIAGNOSTIC FINDINGS:  ?IMPRESSION: ?1. Diffuse multilevel degenerative change with multilevel endplate ?osteophyte formation. Disc space loss again noted at L5-S1. Diffuse ?facet hypertrophy. 5 mm anterolisthesis L4 on L5 again noted. ?  ?2. 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 Similar findings noted on ?prior exam. No acute abnormality ? ?PATIENT SURVEYS:  ?01/01/2022: FOTO 52% (predicted 66% ) ? ?SCREENING FOR RED FLAGS: ?Bowel or bladder incontinence: No ?Spinal tumors: No ? ? ?COGNITION: ? Overall cognitive status: Within functional limits for tasks assessed   ?  ?SENSATION: ?WFL ? ?MUSCLE LENGTH: ?01/01/2022: Hamstrings: Right 65 deg; Left 80 deg ? ? ?POSTURE:  ?Forward head,  decreased lumbar lordosis ? ?PALPATION: ?TTP: lumbar paraspinals ? ?LUMBAR ROM:  ? ?Active  A/PROM  ?01/01/2022  ?Flexion 50  ?Extension 24  ?Right lateral flexion 25  ?Left lateral flexion 22  ?Right rotation Limited 25%  ?Left rotation Limited 25 %  ? (Blank rows = not tested) ? ?LE ROM: ? ?Active  Right ?01/01/2022 Left ?01/01/2022  ?Hip flexion 120 120  ?Hip extension    ?Hip abduction 40 45  ?Hip adduction    ?Hip internal rotation    ?Hip external rotation    ? (Blank rows = not tested) ? ?LE MMT: ? ?MMT Right ?01/01/2022 Left ?01/01/2022  ?Hip flexion 4/5 5/5  ?Hip extension 4/5 5/5  ?Hip abduction 4/5 5/5  ?Hip adduction 4/5 5/5  ?Hip internal rotation    ?Hip external rotation    ?Knee flexion 5/5 5/5  ?Knee extension 5/5 5/5  ? (Blank rows = not tested) ? ?LUMBAR SPECIAL TESTS:  ?01/01/2022: Slump test: Positive Rt LE ? ?FUNCTIONAL TESTS:  ?01/01/2022: 5 times sit to stand: 14 seconds ? ?GAIT: ?01/01/2022: Distance walked: 50 feet  ?Assistive device utilized: None ?Level of assistance: Complete Independence ? ? ? ? ?TODAY'S TREATMENT  ?HEP instruction/performance c cues for techniques, handout provided.  Trial set performed of each for comprehension and symptom assessment.  See below for exercise list. ? ? ?PATIENT EDUCATION:  ?Education details: PT POC, HEP ?Person educated: Patient ?Education method: verbalized understanding, returned demonstration, verbal cues required, and tactile cues requiredExplanation, Demonstration, Tactile cues, Verbal cues, and Handouts ?Education comprehension:  ? ? ?HOME EXERCISE PROGRAM: ?Access Code: [email or web address] ?URL: [email or web address] ?Date: 01/01/2022 ?Prepared by: Kearney Hard ? ?Exercises ?- Seated Hamstring Stretch  - 2 x daily - 7 x weekly - 3 reps - 30 seconds hold ?- Supine Bridge  - 1 x daily - 7 x weekly - 3 sets - 10 reps ?- Supine Lower Trunk Rotation  - 2 x daily - 7 x weekly - 3 reps - 20 sec hold ?- Modified Thomas Stretch  - 2 x daily - 7 x weekly -  3 reps -  30 seconds hold ?- Hooklying Single Knee to Chest Stretch  - 2 x daily - 7 x weekly - 3 reps - 0 sconds hold ? ?ASSESSMENT: ? ?CLINICAL IMPRESSION: ?Patient is a 76 y.o. male  who comes to clinic with complaints of low back pain with mobility, strength and movement coordination deficits that impair their ability to perform usual daily and recreational functional activities without increase difficulty/symptoms at this time.  Patient to benefit from skilled PT services to address impairments and limitations to improve to previous level of function without restriction secondary to condition.  ? ? ?OBJECTIVE IMPAIRMENTS decreased balance, decreased mobility, difficulty walking, decreased ROM, decreased strength, impaired flexibility, prosthetic dependency , and pain.  ? ?ACTIVITY LIMITATIONS community activity.  ? ?PERSONAL FACTORS TIA, amputation finger and thumb, abdominal surgery, ETOH are also affecting patient's functional outcome.  ? ? ?REHAB POTENTIAL: Good ? ?CLINICAL DECISION MAKING: Stable/uncomplicated ? ?EVALUATION COMPLEXITY: Low ? ? ?GOALS: ?Goals reviewed with patient? Yes ?Short term PT Goals (target date for Short term goals are 3 weeks 5//19/2023) ?Patient will demonstrate independent use of home exercise program to maintain progress from in clinic treatments. ?Goal status: New ?  ?Long term PT goals (target dates for all long term goals are 6 weeks 02/15/2022 ) ?Patient will demonstrate/report pain at worst less than or equal to 2/10 to facilitate minimal limitation in daily activity secondary to pain symptoms. ?Goal status: New ? ?Patient will demonstrate independent use of home exercise program to facilitate ability to maintain/progress functional gains from skilled physical therapy services. ?Goal status: New ? ?Patient will demonstrate FOTO outcome > or = 66 % to indicate reduced disability due to condition. ?Goal status: New ? ?Pt will be able to amb 20 minutes on community surfaces with  pain </= 2/10 in his low back.  ?Goal status: New ? ?    5.  Pt will be able to navigate 1 flight of stairs with single hand rail with no pain.  ?Goal status: New ? ?     6. Pt will be able to lift 20 pound

## 2022-01-14 ENCOUNTER — Encounter: Payer: Self-pay | Admitting: Physical Therapy

## 2022-01-14 ENCOUNTER — Ambulatory Visit: Payer: Medicare HMO | Admitting: Physical Therapy

## 2022-01-14 DIAGNOSIS — M5441 Lumbago with sciatica, right side: Secondary | ICD-10-CM | POA: Diagnosis not present

## 2022-01-14 DIAGNOSIS — R262 Difficulty in walking, not elsewhere classified: Secondary | ICD-10-CM

## 2022-01-14 DIAGNOSIS — M6281 Muscle weakness (generalized): Secondary | ICD-10-CM | POA: Diagnosis not present

## 2022-01-14 NOTE — Therapy (Addendum)
OUTPATIENT PHYSICAL THERAPY TREATMENT NOTE/discharge addendum PHYSICAL THERAPY DISCHARGE SUMMARY  Visits from Start of Care: 2  Current functional level related to goals / functional outcomes: See below   Remaining deficits: See below   Education / Equipment: HEP  Plan:  Patient goals were not met. Patient is being discharged due to not returning since last visit. Terry Russo, PT, DPT 03/25/22 8:22 AM       Patient Name: Terry Russo MRN: [id number] DOB:07-29-1946, 76 y.o., male, male Today's Date: 01/14/2022   END OF SESSION:   PT End of Session - 01/14/22 1501     Visit Number 2    Number of Visits 13    Date for PT Re-Evaluation 02/15/22    Authorization - Visit Number 2    Authorization - Number of Visits 12    PT Start Time 1500    PT Stop Time 1540    PT Time Calculation (min) 40 min    Activity Tolerance Patient tolerated treatment well    Behavior During Therapy [facility] for tasks assessed/performed             Past Medical History:  Diagnosis Date   Chicken pox    Hypertension    Right eye injury    pt has glass eye   TIA (transient ischemic attack) 2017   Past Surgical History:  Procedure Laterality Date   ABDOMINAL SURGERY     AMPUTATION FINGER / THUMB     left   COLONOSCOPY  10/31/2011   Procedure: COLONOSCOPY;  Surgeon: Owens Loffler, MD;  Location: [facility];  Service: Endoscopy;  Laterality: N/A;   ENUCLEATION     glass eye   HERNIA REPAIR     Patient Active Problem List   Diagnosis Date Noted   Sciatica of right side 11/26/2021   Sacroiliac joint pain 11/26/2021   Medicare annual wellness visit, subsequent 08/31/2021   Elevated PSA 08/31/2021   Screening for colon cancer 08/31/2021   Advanced directives, counseling/discussion 08/31/2021   Pain, dental 05/25/2021   Alcohol use with alcohol-induced mood disorder ([place]) 03/19/2021   Involuntary commitment    Erectile dysfunction 07/07/2019   TIA (transient ischemic attack) 06/03/2016    Uncontrolled hypertension 10/18/2011      THERAPY DIAG:  Acute right-sided low back pain with right-sided sciatica  Muscle weakness (generalized)  Difficulty in walking, not elsewhere classified  PCP: Michela Pitcher, NP   REFERRING PROVIDER: Lanae Crumbly, PA-C   REFERRING DIAG: [phone] spondylosis with radiculopathy, lumbar region   THERAPY DIAG:  Acute right-sided low back pain with right-sided sciatica   Muscle weakness (generalized)   Difficulty in walking, not elsewhere classified   ONSET DATE: 3 months ago   SUBJECTIVE:  SUBJECTIVE STATEMENT: Pt arriving today saying the pain is bad today  in his Rt lower-mid back     PERTINENT HISTORY:  TIA, amputation finger and thumb, abdominal surgery, ETOH   PAIN:  Are you having pain? Yes: NPRS scale: 7/10 Pain location: low back on Rt side Pain description: achy, burning Aggravating factors: sitting prolonged Relieving factors: over the counter pain meds, massage     PRECAUTIONS: None   WEIGHT BEARING RESTRICTIONS No   FALLS:  Has patient fallen in last 6 months? 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 Diffuse multilevel degenerative change with multilevel endplate osteophyte formation. Disc space loss again noted at L5-S1. Diffuse facet hypertrophy. 5 mm anterolisthesis L4 on L5 again noted.   2. 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 Similar findings noted on prior exam. No acute abnormality   PATIENT SURVEYS:  01/01/2022: FOTO 52% (predicted 66% )   SCREENING FOR RED FLAGS: Bowel or bladder incontinence: No Spinal tumors: No     COGNITION:           Overall cognitive status: Within functional limits for tasks assessed                          SENSATION: [facility]   MUSCLE LENGTH: 01/01/2022: Hamstrings: Right 65 deg; Left 80 deg     POSTURE:  Forward head, decreased lumbar lordosis   PALPATION: TTP: lumbar paraspinals   LUMBAR ROM:    Active  A/PROM  01/01/2022  Flexion 50  Extension 24  Right lateral flexion 25  Left lateral flexion 22  Right rotation Limited 25%  Left rotation Limited 25 %   (Blank rows = not tested)   LE ROM:   Active  Right 01/01/2022 Left 01/01/2022  Hip flexion 120 120  Hip extension      Hip abduction 40 45  Hip adduction      Hip internal rotation      Hip external rotation       (Blank rows = not tested)   LE MMT:   MMT Right 01/01/2022 Left 01/01/2022  Hip flexion 4/5 5/5  Hip extension 4/5 5/5  Hip abduction 4/5 5/5  Hip adduction 4/5 5/5  Hip internal rotation      Hip external rotation      Knee flexion 5/5 5/5  Knee extension 5/5 5/5   (Blank rows = not tested)   LUMBAR SPECIAL TESTS:  01/01/2022: Slump test: Positive Rt LE   FUNCTIONAL TESTS:  01/01/2022: 5 times sit to stand: 14 seconds   GAIT: 01/01/2022: Distance walked: 50 feet  Assistive device utilized: None Level of assistance: Complete Independence         TODAY'S TREATMENT  01/14/22 Nu step X 8 min L6 UE/LE Supine hamstring stretch with strap 2X30 sec bilat Supine SKTC stretch 30 sec X 1, had complaints of left anterior hip pain so discontinued Supine DKTC stretch with feet on ball 10 sec X 10 (had no pain with this) Supine bridges 5 sec X15 Supine ab set  with bilat shoulder extension isometric into red ball in hooklying 5 sec X 15 Supine ab set with hip flexion  isometric into ball 5 in hooklying 5 sec X10 Supine LTR 5 sec x 10 bilat Standing unilat alternating rows blue X 15 ea  Manual therapy: in prone, performed STM to Rt lumbar P.S, and central PA spinal mobs L4-L1 grade 3-4     PATIENT EDUCATION:  Education details: PT POC, HEP Person educated: Patient Education method: verbalized understanding, returned demonstration, verbal cues required, and tactile cues requiredExplanation, Demonstration, Tactile cues, Verbal cues, and Handouts Education comprehension:      HOME EXERCISE PROGRAM: Access Code: [email or web address] URL: [email or web address] Date: 01/01/2022 Prepared by: Kearney Hard   Exercises - Seated Hamstring Stretch  - 2 x daily - 7 x weekly - 3 reps - 30 seconds hold - Supine Bridge  - 1 x daily - 7 x weekly - 3 sets - 10 reps - Supine Lower Trunk Rotation  - 2 x daily - 7 x weekly - 3 reps - 20 sec hold - Modified Thomas Stretch  - 2 x daily - 7 x weekly - 3 reps - 30 seconds hold - Hooklying Single Knee to Chest Stretch  - 2 x daily - 7 x weekly - 3 reps - 0 sconds hold   ASSESSMENT:   CLINICAL IMPRESSION: He arrived in significant pain and had less overall pain after session with good response to manual therapy. He will continue to benefit from PT to maximize his function     OBJECTIVE IMPAIRMENTS decreased balance, decreased mobility, difficulty walking, decreased ROM, decreased strength, impaired flexibility, prosthetic dependency , and pain.    ACTIVITY LIMITATIONS community activity.    PERSONAL FACTORS TIA, amputation finger and thumb, abdominal surgery, ETOH are also affecting patient's functional outcome.      REHAB POTENTIAL: Good   CLINICAL DECISION MAKING: Stable/uncomplicated   EVALUATION COMPLEXITY: Low     GOALS: Goals reviewed with patient? Yes Short term PT Goals (target date for Short term goals are 3 weeks 5//19/2023) Patient will demonstrate independent use of home exercise program to  maintain progress from in clinic treatments. Goal status: New   Long term PT goals (target dates for all long term goals are 6 weeks 02/15/2022 ) Patient will demonstrate/report pain at worst less than or equal to 2/10 to facilitate minimal limitation in daily activity secondary to pain symptoms. Goal status: New   Patient will demonstrate independent use of home exercise program to facilitate ability to maintain/progress functional gains from skilled physical therapy services. Goal status: New   Patient will demonstrate FOTO outcome > or = 66 % to indicate reduced disability due to condition. Goal status: New   Pt will be able to amb 20 minutes on community surfaces with pain </= 2/10 in his low back.  Goal status: New       5.  Pt will be able to navigate 1 flight of stairs with single hand rail with no pain.  Goal status: New        6. Pt will be able to lift 20 pounds from floor to counter height using proper body mechanics with no pain reported.             A. Goal Status: New     PLAN: PT FREQUENCY: 2x/week   PT DURATION: 6 weeks   PLANNED INTERVENTIONS: Therapeutic exercises, Therapeutic activity, Neuromuscular re-education, Balance training, Gait training, Patient/Family education, Joint manipulation, Joint mobilization, Stair training,  Dry Needling, Electrical stimulation, Spinal mobilization, Cryotherapy, Moist heat, Taping, Traction, Ultrasound, and Manual therapy.   PLAN FOR NEXT SESSION:  Nustep or bike, core strengthening, manual LE strengthening    Debbe Odea, PT,DPT 01/14/2022, 3:43 PM

## 2022-01-15 ENCOUNTER — Encounter: Payer: Self-pay | Admitting: Gastroenterology

## 2022-01-16 ENCOUNTER — Encounter: Payer: Self-pay | Admitting: Surgery

## 2022-01-16 ENCOUNTER — Ambulatory Visit (INDEPENDENT_AMBULATORY_CARE_PROVIDER_SITE_OTHER): Payer: Medicare HMO | Admitting: Surgery

## 2022-01-16 ENCOUNTER — Encounter: Payer: Medicare HMO | Admitting: Physical Therapy

## 2022-01-16 DIAGNOSIS — M5416 Radiculopathy, lumbar region: Secondary | ICD-10-CM | POA: Diagnosis not present

## 2022-01-16 DIAGNOSIS — M4316 Spondylolisthesis, lumbar region: Secondary | ICD-10-CM | POA: Diagnosis not present

## 2022-01-16 NOTE — Progress Notes (Signed)
Office Visit Note   Patient: Terry Russo           Date of Birth: [birth year]           MRN: [id number] Visit Date: 12/19/2021              Requested by: Michela Pitcher, NP [address] [place],  [place] [address] PCP: Michela Pitcher, NP   Assessment & Plan: Visit Diagnoses:  1. Other spondylosis with radiculopathy, lumbar region     Plan: I will schedule patient for formal PT.  Follow-up in the office with me in 4 weeks for recheck.  If he continues have ongoing symptoms I will plan to get a lumbar MRI to rule out HNP/stenosis.  Follow-Up Instructions: Return in about 4 weeks (around 01/16/2022) for with [facility] recheck low back.   Orders:  Orders Placed This Encounter  Procedures   Ambulatory referral to Physical Therapy   No orders of the defined types were placed in this encounter.     Procedures: No procedures performed   Clinical Data: No additional findings.   Subjective: Chief Complaint  Patient presents with   Lower Back - New Patient (Initial Visit)    HPI Patient comes in for evaluation of low back pain.  He has been treated previously by his primary care provider.  Did have a prednisone taper.  States pain started getting worse over the last couple of months.  No injury.  No previous back surgeries.  Had some radiation of pain down the right leg.  No left-sided symptoms.  Planes of weakness.  PCP ordered a lumbar spine x-ray which was done November 26, 2021 and that showed:  EXAM: LUMBAR SPINE - 2-3 VIEW   COMPARISON:  Lumbar spine series 08/18/2015. CT abdomen pelvis 01/17/2012.   FINDINGS: Lumbar spine numbered the lowest segmented appearing lumbar shaped vertebrae on lateral view as L5. Diffuse multilevel degenerative change with multilevel endplate osteophyte formation. Disc space loss again noted at L5-S1. Diffuse facet hypertrophy. 5 mm anterolisthesis L4 on L5 again noted. Old posttraumatic deformity noted of the left ilium with adjacent  heterotopic bone formation again noted. Similar findings noted on prior exams. No acute abnormality. Multiple pelvic calcifications, most likely phleboliths.   IMPRESSION: 1. Diffuse multilevel degenerative change with multilevel endplate osteophyte formation. Disc space loss again noted at L5-S1. Diffuse facet hypertrophy. 5 mm anterolisthesis L4 on L5 again noted.   2. 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 Similar findings noted on prior exam. No acute abnormality.     Electronically Signed   By: Marcello Moores  Register M.D.   On: 11/28/2021 09:35       Review of Systems No current complaints of cardiopulmonary GI/GU issues  Objective: Vital Signs: There were no vitals taken for this visit.  Physical Exam HENT:     Head: Normocephalic and atraumatic.     Nose: Nose normal.  Pulmonary:     Effort: No respiratory distress.  Musculoskeletal:     Comments: Pleasant male alert and oriented in no acute distress.  Gait is normal.  Minimal lumbar paraspinal tenderness.  No sciatic notch tenderness.  Negative logroll bilateral hips.  Negative straight leg raise.  He does have tight hamstrings bilaterally.  No focal motor deficits.  Neurological:     Mental Status: He is oriented to person, place, and time.     Ortho Exam  Specialty Comments:  No specialty  comments available.  Imaging: No results found.   PMFS History: Patient Active Problem List   Diagnosis Date Noted   Sciatica of right side 11/26/2021   Sacroiliac joint pain 11/26/2021   Medicare annual wellness visit, subsequent 08/31/2021   Elevated PSA 08/31/2021   Screening for colon cancer 08/31/2021   Advanced directives, counseling/discussion 08/31/2021   Pain, dental 05/25/2021   Alcohol use with alcohol-induced mood disorder ([place]) 03/19/2021   Involuntary commitment    Erectile dysfunction 07/07/2019   TIA (transient ischemic attack) 06/03/2016    Uncontrolled hypertension 10/18/2011   Past Medical History:  Diagnosis Date   Chicken pox    Heart murmur    Hypertension    Right eye injury    pt has glass eye   TIA (transient ischemic attack) 2017    Family History  Problem Relation Age of Onset   Hypertension Mother    CVA Father    Hypertension Sister    Hypertension Sister    Diabetes Brother    Cancer Neg Hx    Heart disease Neg Hx    Stroke Neg Hx    Colon cancer Neg Hx    Colon polyps Neg Hx    Esophageal cancer Neg Hx    Stomach cancer Neg Hx    Rectal cancer Neg Hx     Past Surgical History:  Procedure Laterality Date   ABDOMINAL SURGERY     AMPUTATION FINGER / THUMB     left   COLONOSCOPY  10/31/2011   Procedure: COLONOSCOPY;  Surgeon:  Loffler, MD;  Location: [facility];  Service: Endoscopy;  Laterality: N/A;   ENUCLEATION     glass eye   HERNIA REPAIR     Social History   Occupational History   Occupation: Retired Building control surveyor, now doing Secretary/administrator  Tobacco Use   Smoking status: Former    Packs/day: 1.50    Years: 20.00    Total pack years: 30.00    Types: Cigarettes    Quit date: 09/09/1997    Years since quitting: 24.7   Smokeless tobacco: Never  Vaping Use   Vaping Use: Never used  Substance and Sexual Activity   Alcohol use: Yes    Alcohol/week: 12.0 standard drinks of alcohol    Types: 12 Cans of beer per week    Comment: half to a whole beer - occasionally (social)   Drug use: No   Sexual activity: Not on file

## 2022-01-16 NOTE — Progress Notes (Signed)
? ?Office Visit Note ?  ?Patient: Terry Russo           ?Date of Birth: 03-Feb-1946           ?MRN: [id number] ?Visit Date: 01/16/2022 ?             ?Requested by: Michela Pitcher, NP ?[address],  [place] [address] ?PCP: Michela Pitcher, NP ? ? ?Assessment & Plan: ?Visit Diagnoses:  ?1. Radiculopathy, lumbar region   ?2. Spondylolisthesis, lumbar region   ? ? ?Plan: With patient's ongoing pain and failed conservative treatment up to this point I recommend getting lumbar MRI to rule out HNP/stenosis.  He does have L4-5 spondylolisthesis seen on previous x-ray.  Follow-up with Dr. Louanne Skye in 3 weeks to discuss results and further treatment options. ? ?Follow-Up Instructions: Return in about 3 weeks (around 02/06/2022) for with dr Louanne Skye to review lumbar mri.  ? ?Orders:  ?Orders Placed This Encounter  ?Procedures  ? MR Lumbar Spine w/o contrast  ? ?No orders of the defined types were placed in this encounter. ? ? ? ? Procedures: ?No procedures performed ? ? ?Clinical Data: ?No additional findings. ? ? ?Subjective: ?Chief Complaint  ?Patient presents with  ? Lower Back - Follow-up  ? ? ?HPI ?76 year old black male returns for recheck of his low back pain.  He is going to formal PT and states that he continues have ongoing pain.  Did give minimal temporary improvement.  He is also failed conservative treatment with medication management by his PCP. ?Review of Systems ?No current cardiopulmonary GI/GU issue ? ?Objective: ?Vital Signs: There were no vitals taken for this visit. ? ?Physical Exam ?HENT:  ?   Head: Normocephalic.  ?Eyes:  ?   Extraocular Movements: Extraocular movements intact.  ?Pulmonary:  ?   Effort: No respiratory distress.  ?Musculoskeletal:  ?   Comments: Gait is normal.  Positive bilateral lumbar paraspinal tenderness/spasm.  Some tenderness around the bilateral SI joints.  Negative logroll bilaterally.  Positive right straight leg raise with tight right hamstring.  No focal motor deficits.   ?Neurological:  ?   Mental Status: He is alert.  ?Psychiatric:     ?   Mood and Affect: Mood normal.  ? ? ?Ortho Exam ? ?Specialty Comments:  ?No specialty comments available. ? ?Imaging: ?No results found. ? ? ?PMFS History: ?Patient Active Problem List  ? Diagnosis Date Noted  ? Sciatica of right side 11/26/2021  ? Sacroiliac joint pain 11/26/2021  ? Medicare annual wellness visit, subsequent 08/31/2021  ? Elevated PSA 08/31/2021  ? Screening for colon cancer 08/31/2021  ? Advanced directives, counseling/discussion 08/31/2021  ? Pain, dental 05/25/2021  ? Alcohol use with alcohol-induced mood disorder ([place]) 03/19/2021  ? Involuntary commitment   ? Erectile dysfunction 07/07/2019  ? TIA (transient ischemic attack) 06/03/2016  ? Uncontrolled hypertension 10/18/2011  ? ?Past Medical History:  ?Diagnosis Date  ? Chicken pox   ? Hypertension   ? Right eye injury   ? pt has glass eye  ? TIA (transient ischemic attack) 2017  ?  ?Family History  ?Problem Relation Age of Onset  ? Hypertension Mother   ? CVA Father   ? Hypertension Sister   ? Hypertension Sister   ? Diabetes Brother   ? Cancer Neg Hx   ? Heart disease Neg Hx   ? Stroke Neg Hx   ? Colon cancer Neg Hx   ? Colon polyps Neg Hx   ?  Esophageal cancer Neg Hx   ? Stomach cancer Neg Hx   ? Rectal cancer Neg Hx   ?  ?Past Surgical History:  ?Procedure Laterality Date  ? ABDOMINAL SURGERY    ? AMPUTATION FINGER / THUMB    ? left  ? COLONOSCOPY  10/31/2011  ? Procedure: COLONOSCOPY;  Surgeon:  Loffler, MD;  Location: [facility];  Service: Endoscopy;  Laterality: N/A;  ? ENUCLEATION    ? glass eye  ? HERNIA REPAIR    ? ?Social History  ? ?Occupational History  ? Occupation: Retired Building control surveyor, now doing Secretary/administrator  ?Tobacco Use  ? Smoking status: Former  ?  Packs/day: 1.50  ?  Years: 20.00  ?  Pack years: 30.00  ?  Types: Cigarettes  ?  Quit date: 09/09/1997  ?  Years since quitting: 24.3  ? Smokeless tobacco: Never  ?Vaping Use  ? Vaping Use: Never used   ?Substance and Sexual Activity  ? Alcohol use: Yes  ?  Alcohol/week: 1.0 standard drink  ?  Types: 1 Cans of beer per week  ?  Comment: half to a whole beer - occasionally (social)  ? Drug use: No  ? Sexual activity: Not on file  ? ? ? ? ? ? ?

## 2022-01-17 ENCOUNTER — Encounter: Payer: Medicare HMO | Admitting: Rehabilitative and Restorative Service Providers"

## 2022-01-22 ENCOUNTER — Telehealth: Payer: Self-pay | Admitting: Gastroenterology

## 2022-01-22 ENCOUNTER — Encounter: Payer: Medicare HMO | Admitting: Gastroenterology

## 2022-01-22 NOTE — Telephone Encounter (Signed)
Good Morning Dr. Ardis Hughs, ? ?I called this patient at 10:20 am today.  He forgot he had a procedure He will reschedule. ? ?I will NO SHOW him.   ?

## 2022-01-31 ENCOUNTER — Telehealth: Payer: Self-pay | Admitting: Physical Therapy

## 2022-01-31 ENCOUNTER — Encounter: Payer: Medicare HMO | Admitting: Physical Therapy

## 2022-01-31 NOTE — Telephone Encounter (Signed)
Pt did not show for PT appointment today. They were contacted by phone 2 attempts without answer.  [name], PT, DPT 01/31/22 10:35 AM

## 2022-02-06 ENCOUNTER — Ambulatory Visit: Payer: Medicare HMO | Admitting: Surgery

## 2022-02-06 ENCOUNTER — Telehealth: Payer: Self-pay

## 2022-02-06 NOTE — Telephone Encounter (Signed)
Attempted to contact patient however had to leave a voicemail. Informing patient that he has not had is MRI preformed and we would need to scheduled patient to discuss results with Dr. Louanne Skye after he has this preformed.

## 2022-02-11 ENCOUNTER — Encounter: Payer: Self-pay | Admitting: *Deleted

## 2022-03-18 ENCOUNTER — Encounter: Payer: Medicare HMO | Admitting: Gastroenterology

## 2022-03-18 ENCOUNTER — Telehealth: Payer: Self-pay | Admitting: Gastroenterology

## 2022-03-18 NOTE — Telephone Encounter (Signed)
Called patient at 10:20 NO answer-- Left message to have pt call me back at my direct number to let us know if he is coming in or needs to reschedule.

## 2022-03-19 ENCOUNTER — Encounter: Payer: Self-pay | Admitting: Gastroenterology

## 2022-04-03 ENCOUNTER — Ambulatory Visit (AMBULATORY_SURGERY_CENTER): Payer: Self-pay | Admitting: *Deleted

## 2022-04-03 VITALS — Ht 71.0 in | Wt 176.0 lb

## 2022-04-03 DIAGNOSIS — Z1211 Encounter for screening for malignant neoplasm of colon: Secondary | ICD-10-CM

## 2022-04-03 NOTE — Progress Notes (Signed)
Patient is here in-person for PV. Patient denies any allergies to eggs or soy. Patient denies any problems with anesthesia/sedation. Patient is not on any oxygen at home. Patient is not taking any diet/weight loss medications or blood thinners. Went over procedure prep instructions with the patient. Patient is aware of our care-partner policy.

## 2022-04-16 ENCOUNTER — Telehealth: Payer: Self-pay | Admitting: *Deleted

## 2022-04-16 NOTE — Telephone Encounter (Signed)
Spoke with pt- reschedule with Dr. Silverio Decamp 05-01-22 at 4:00 pm.  New instructions mailed to pt

## 2022-04-24 ENCOUNTER — Encounter: Payer: Self-pay | Admitting: Gastroenterology

## 2022-05-01 ENCOUNTER — Encounter: Payer: Medicare HMO | Admitting: Gastroenterology

## 2022-05-01 ENCOUNTER — Ambulatory Visit (AMBULATORY_SURGERY_CENTER): Payer: Medicare HMO | Admitting: Gastroenterology

## 2022-05-01 ENCOUNTER — Encounter: Payer: Self-pay | Admitting: Gastroenterology

## 2022-05-01 VITALS — BP 156/93 | HR 45 | Temp 97.1°F | Resp 12 | Ht 71.0 in | Wt 176.0 lb

## 2022-05-01 DIAGNOSIS — Z1211 Encounter for screening for malignant neoplasm of colon: Secondary | ICD-10-CM | POA: Diagnosis not present

## 2022-05-01 DIAGNOSIS — D125 Benign neoplasm of sigmoid colon: Secondary | ICD-10-CM

## 2022-05-01 MED ORDER — SODIUM CHLORIDE 0.9 % IV SOLN: 500.0000 mL | INTRAVENOUS | Status: DC

## 2022-05-01 NOTE — Progress Notes (Unsigned)
Report to pacu rn. Vss. Care resumed by rn. 

## 2022-05-01 NOTE — Op Note (Signed)
[name]: Terry Russo Procedure Date: 05/01/2022 1:34 PM MRN: [id number] Endoscopist: Mauri Pole , MD Age: 76 Referring MD:  Date of Birth: 06-28-1946 Gender: Male Account #: [id number] Procedure:                Colonoscopy Indications:              Screening for colorectal malignant neoplasm Medicines:                Monitored Anesthesia Care Procedure:                Pre-Anesthesia Assessment:                           - Prior to the procedure, a History and Physical                            was performed, and patient medications and                            allergies were reviewed. The patient's tolerance of                            previous anesthesia was also reviewed. The risks                            and benefits of the procedure and the sedation                            options and risks were discussed with the patient.                            All questions were answered, and informed consent                            was obtained. Prior Anticoagulants: The patient has                            taken no previous anticoagulant or antiplatelet                            agents. ASA Grade Assessment: II - A patient with                            mild systemic disease. After reviewing the risks                            and benefits, the patient was deemed in                            satisfactory condition to undergo the procedure.                           After obtaining informed consent, the colonoscope  was passed under direct vision. Throughout the                            procedure, the patient's blood pressure, pulse, and                            oxygen saturations were monitored continuously. The                            PCF-HQ190L Colonoscope was introduced through the                            anus and advanced to the the cecum, identified by                            appendiceal  orifice and ileocecal valve. The                            colonoscopy was performed without difficulty. The                            patient tolerated the procedure well. The quality                            of the bowel preparation was excellent. The                            ileocecal valve, appendiceal orifice, and rectum                            were photographed. Scope In: 1:37:05 PM Scope Out: 1:49:10 PM Scope Withdrawal Time: 0 hours 9 minutes 27 seconds  Total Procedure Duration: 0 hours 12 minutes 5 seconds  Findings:                 The perianal and digital rectal examinations were                            normal.                           A 7 mm polyp was found in the descending colon. The                            polyp was pedunculated. The polyp was removed with                            a hot snare. Resection and retrieval were complete.                           Scattered small and large-mouthed diverticula were                            found in the sigmoid colon, descending colon,  transverse colon and ascending colon.                           Non-bleeding external and internal hemorrhoids were                            found during retroflexion. The hemorrhoids were                            medium-sized. Complications:            No immediate complications. Estimated Blood Loss:     Estimated blood loss was minimal. Impression:               - One 7 mm polyp in the descending colon, removed                            with a hot snare. Resected and retrieved.                           - Moderate diverticulosis in the sigmoid colon, in                            the descending colon, in the transverse colon and                            in the ascending colon.                           - Non-bleeding external and internal hemorrhoids. Recommendation:           - Patient has a contact number available for                             emergencies. The signs and symptoms of potential                            delayed complications were discussed with the                            patient. Return to normal activities tomorrow.                            Written discharge instructions were provided to the                            patient.                           - Resume previous diet.                           - Continue present medications.                           - Await pathology results.                           -  Repeat colonoscopy date to be determined after                            pending pathology results are reviewed for                            surveillance based on pathology results. Mauri Pole, MD 05/01/2022 2:04:19 PM This report has been signed electronically.

## 2022-05-01 NOTE — Progress Notes (Unsigned)
Called to room to assist during endoscopic procedure.  Patient ID and intended procedure confirmed with present staff. Received instructions for my participation in the procedure from the performing physician.  

## 2022-05-01 NOTE — Progress Notes (Unsigned)
[facility] History and Physical   Primary Care Physician:  Michela Pitcher, NP   Reason for Procedure:  Colorectal cancer screening  Plan:    Screening colonoscopy with possible interventions as needed     HPI: Terry Russo is a very pleasant 76 y.o. male here for screening colonoscopy. Denies any nausea, vomiting, abdominal pain, melena or bright red blood per rectum  The risks and benefits as well as alternatives of endoscopic procedure(s) have been discussed and reviewed. All questions answered. The patient agrees to proceed.    Past Medical History:  Diagnosis Date   Chicken pox    Heart murmur    Hypertension    Right eye injury    pt has glass eye   TIA (transient ischemic attack) 2017    Past Surgical History:  Procedure Laterality Date   ABDOMINAL SURGERY     AMPUTATION FINGER / THUMB     left   COLONOSCOPY  10/31/2011   Procedure: COLONOSCOPY;  Surgeon: Owens Loffler, MD;  Location: [facility];  Service: Endoscopy;  Laterality: N/A;   ENUCLEATION     glass eye   HERNIA REPAIR      Prior to Admission medications   Medication Sig Start Date End Date Taking? Authorizing Provider  amLODipine (NORVASC) 10 MG tablet Take 1 tablet (10 mg total) by mouth daily. 11/26/21  Yes Michela Pitcher, NP  aspirin 81 MG tablet Take 81 mg by mouth daily.   Yes [provider]  Multiple Vitamins-Minerals (EYE VITAMINS PO) Take by mouth.   Yes [provider]  sildenafil (VIAGRA) 50 MG tablet TAKE 1/2 TO 1 (ONE-HALF TO ONE) TABLET BY MOUTH  DAILY AS NEEDED FOR ERECTILE DYSFUNCTION 03/16/20  Yes Baity, Coralie Keens, NP  valsartan-hydrochlorothiazide (DIOVAN-HCT) 320-25 MG tablet Take 1 tablet by mouth daily. 11/26/21  Yes Michela Pitcher, NP  ibuprofen (ADVIL) 200 MG tablet Take 200 mg by mouth every 6 (six) hours as needed.    [provider]    Current Outpatient Medications  Medication Sig Dispense Refill   amLODipine (NORVASC) 10 MG tablet  Take 1 tablet (10 mg total) by mouth daily. 90 tablet 3   aspirin 81 MG tablet Take 81 mg by mouth daily.     Multiple Vitamins-Minerals (EYE VITAMINS PO) Take by mouth.     sildenafil (VIAGRA) 50 MG tablet TAKE 1/2 TO 1 (ONE-HALF TO ONE) TABLET BY MOUTH  DAILY AS NEEDED FOR ERECTILE DYSFUNCTION 10 tablet 5   valsartan-hydrochlorothiazide (DIOVAN-HCT) 320-25 MG tablet Take 1 tablet by mouth daily. 90 tablet 2   ibuprofen (ADVIL) 200 MG tablet Take 200 mg by mouth every 6 (six) hours as needed.     Current Facility-Administered Medications  Medication Dose Route Frequency Provider Last Rate Last Admin   0.9 %  sodium chloride infusion  500 mL Intravenous Continuous Mads Borgmeyer V, MD        Allergies as of 05/01/2022   (No Known Allergies)    Family History  Problem Relation Age of Onset   Hypertension Mother    CVA Father    Hypertension Sister    Hypertension Sister    Diabetes Brother    Cancer Neg Hx    Heart disease Neg Hx    Stroke Neg Hx    Colon cancer Neg Hx    Colon polyps Neg Hx    Esophageal cancer Neg Hx    Stomach cancer Neg Hx    Rectal cancer  Neg Hx     Social History   Socioeconomic History   Marital status: Widowed    Spouse name: Bonnita Nasuti   Number of children: 7   Years of education: Not on file   Highest education level: Not on file  Occupational History   Occupation: Retired Building control surveyor, now doing Secretary/administrator  Tobacco Use   Smoking status: Former    Packs/day: 1.50    Years: 20.00    Total pack years: 30.00    Types: Cigarettes    Quit date: 09/09/1997    Years since quitting: 24.6   Smokeless tobacco: Never  Vaping Use   Vaping Use: Never used  Substance and Sexual Activity   Alcohol use: Yes    Alcohol/week: 12.0 standard drinks of alcohol    Types: 12 Cans of beer per week    Comment: half to a whole beer - occasionally (social)   Drug use: No   Sexual activity: Not on file  Other Topics Concern   Not on file  Social History  Narrative   Lives with wife at home   Social Determinants of Health   Financial Resource Strain: Not on file  Food Insecurity: Not on file  Transportation Needs: Not on file  Physical Activity: Not on file  Stress: Not on file  Social Connections: Not on file  Intimate Partner Violence: Not on file    Review of Systems:  All other review of systems negative except as mentioned in the HPI.  Physical Exam: Vital signs in last 24 hours: BP (!) 164/69   Pulse (!) 45   Temp (!) 97.1 F (36.2 C)   Ht '5\' 11"'$  (1.803 m)   Wt 176 lb (79.8 kg)   SpO2 100%   BMI 24.55 kg/m  General:   Alert, NAD Lungs:  Clear .   Heart:  Regular rate and rhythm Abdomen:  Soft, nontender and nondistended. Neuro/Psych:  Alert and cooperative. Normal mood and affect. A and O x 3  Reviewed labs, radiology imaging, old records and pertinent past GI work up  Patient is appropriate for planned procedure(s) and anesthesia in an ambulatory setting   K. Denzil Magnuson , MD [phone]

## 2022-05-01 NOTE — Patient Instructions (Signed)
Please read handouts provided. Continue present medications. Await pathology results.   YOU HAD AN ENDOSCOPIC PROCEDURE TODAY AT THE [facility]:   Refer to the procedure report that was given to you for any specific questions about what was found during the examination.  If the procedure report does not answer your questions, please call your gastroenterologist to clarify.  If you requested that your care partner not be given the details of your procedure findings, then the procedure report has been included in a sealed envelope for you to review at your convenience later.  YOU SHOULD EXPECT: Some feelings of bloating in the abdomen. Passage of more gas than usual.  Walking can help get rid of the air that was put into your GI tract during the procedure and reduce the bloating. If you had a lower endoscopy (such as a colonoscopy or flexible sigmoidoscopy) you may notice spotting of blood in your stool or on the toilet paper. If you underwent a bowel prep for your procedure, you may not have a normal bowel movement for a few days.  Please Note:  You might notice some irritation and congestion in your nose or some drainage.  This is from the oxygen used during your procedure.  There is no need for concern and it should clear up in a day or so.  SYMPTOMS TO REPORT IMMEDIATELY:  Following lower endoscopy (colonoscopy or flexible sigmoidoscopy):  Excessive amounts of blood in the stool  Significant tenderness or worsening of abdominal pains  Swelling of the abdomen that is new, acute  Fever of 100F or higher  For urgent or emergent issues, a gastroenterologist can be reached at any hour by calling [phone]. Do not use MyChart messaging for urgent concerns.    DIET:  We do recommend a small meal at first, but then you may proceed to your regular diet.  Drink plenty of fluids but you should avoid alcoholic beverages for 24 hours.  ACTIVITY:  You should plan to take it easy for  the rest of today and you should NOT DRIVE or use heavy machinery until tomorrow (because of the sedation medicines used during the test).    FOLLOW UP: Our staff will call the number listed on your records the next business day following your procedure.  We will call around 7:15- 8:00 am to check on you and address any questions or concerns that you may have regarding the information given to you following your procedure. If we do not reach you, we will leave a message.  If you develop any symptoms (ie: fever, flu-like symptoms, shortness of breath, cough etc.) before then, please call [phone].  If you test positive for Covid 19 in the 2 weeks post procedure, please call and report this information to us.    If any biopsies were taken you will be contacted by phone or by letter within the next 1-3 weeks.  Please call us at [phone] if you have not heard about the biopsies in 3 weeks.    SIGNATURES/CONFIDENTIALITY: You and/or your care partner have signed paperwork which will be entered into your electronic medical record.  These signatures attest to the fact that that the information above on your After Visit Summary has been reviewed and is understood.  Full responsibility of the confidentiality of this discharge information lies with you and/or your care-partner.  

## 2022-05-02 ENCOUNTER — Telehealth: Payer: Self-pay

## 2022-05-02 NOTE — Telephone Encounter (Signed)
Left message on follow up call. 

## 2022-05-17 ENCOUNTER — Encounter: Payer: Self-pay | Admitting: Gastroenterology

## 2022-08-19 ENCOUNTER — Telehealth: Payer: Self-pay | Admitting: Nurse Practitioner

## 2022-08-19 NOTE — Telephone Encounter (Signed)
LVM for pt to rtn my call to schedule AWV with [facility]HA call back # [phone] 

## 2022-10-15 ENCOUNTER — Other Ambulatory Visit: Payer: Self-pay | Admitting: Nurse Practitioner

## 2022-10-15 DIAGNOSIS — I10 Essential (primary) hypertension: Secondary | ICD-10-CM

## 2022-11-13 ENCOUNTER — Ambulatory Visit: Payer: Medicare HMO

## 2022-11-13 ENCOUNTER — Telehealth: Payer: Self-pay | Admitting: Nurse Practitioner

## 2022-11-13 NOTE — Telephone Encounter (Signed)
Called patient to schedule Medicare Annual Wellness Visit (AWV). Left message for patient to call back and schedule Medicare Annual Wellness Visit (AWV).  Change AWV to a phone visit on : 11/13/2022  Please schedule an appointment at any time with nha .  If any questions, please contact me at [phone].  Thank you ,  [place] Direct Dial: [phone]

## 2023-02-13 ENCOUNTER — Other Ambulatory Visit: Payer: Self-pay | Admitting: Nurse Practitioner

## 2023-02-13 DIAGNOSIS — I10 Essential (primary) hypertension: Secondary | ICD-10-CM

## 2023-02-13 NOTE — Telephone Encounter (Signed)
Can we schedule the patient for CPe please. Send the medication back to me after and I will refill

## 2023-02-13 NOTE — Telephone Encounter (Signed)
Spoke to pt's relative, asked to have pt to return our call.

## 2023-02-21 NOTE — Telephone Encounter (Signed)
Called all numbers on file one was not working left message on one and one did not have voicemail set up. Patient does not have my chart to send message.

## 2023-03-07 ENCOUNTER — Other Ambulatory Visit: Payer: Self-pay | Admitting: Nurse Practitioner

## 2023-03-07 DIAGNOSIS — I10 Essential (primary) hypertension: Secondary | ICD-10-CM

## 2023-03-07 MED ORDER — AMLODIPINE BESYLATE 10 MG PO TABS: 10.0000 mg | ORAL_TABLET | Freq: Every day | ORAL | 0 refills | Status: DC

## 2023-03-07 NOTE — Telephone Encounter (Signed)
Ok to send letter. Will give 30 day supply

## 2023-03-07 NOTE — Telephone Encounter (Signed)
Called x3 unable to reach. Okay to send letter

## 2023-03-17 ENCOUNTER — Other Ambulatory Visit: Payer: Self-pay | Admitting: Nurse Practitioner

## 2023-03-17 DIAGNOSIS — I10 Essential (primary) hypertension: Secondary | ICD-10-CM

## 2023-04-11 ENCOUNTER — Other Ambulatory Visit: Payer: Self-pay | Admitting: Nurse Practitioner

## 2023-04-11 DIAGNOSIS — I10 Essential (primary) hypertension: Secondary | ICD-10-CM

## 2023-04-17 ENCOUNTER — Other Ambulatory Visit: Payer: Self-pay | Admitting: Nurse Practitioner

## 2023-04-17 DIAGNOSIS — I10 Essential (primary) hypertension: Secondary | ICD-10-CM

## 2023-04-17 NOTE — Telephone Encounter (Signed)
Needs an office visit. Will not refill after this month without seeing him

## 2023-04-21 NOTE — Telephone Encounter (Signed)
LVM for patient to c/b and schedule.  

## 2023-05-01 ENCOUNTER — Other Ambulatory Visit: Payer: Self-pay | Admitting: Nurse Practitioner

## 2023-05-01 DIAGNOSIS — I10 Essential (primary) hypertension: Secondary | ICD-10-CM

## 2023-05-01 NOTE — Telephone Encounter (Signed)
Called pt to schedule appointment. Pt needs to be seen before med change or refill. Call could not be completed as dialed. Unable to leave VM.

## 2023-08-13 ENCOUNTER — Other Ambulatory Visit: Payer: Self-pay | Admitting: Nurse Practitioner

## 2023-08-13 DIAGNOSIS — I10 Essential (primary) hypertension: Secondary | ICD-10-CM

## 2023-08-18 ENCOUNTER — Telehealth: Payer: Self-pay | Admitting: Nurse Practitioner

## 2023-08-18 ENCOUNTER — Ambulatory Visit (HOSPITAL_COMMUNITY)
Admission: EM | Admit: 2023-08-18 | Discharge: 2023-08-18 | Disposition: A | Discharge status: Home / Self Care | Payer: Medicare HMO | Attending: Family Medicine | Admitting: Family Medicine

## 2023-08-18 ENCOUNTER — Encounter (HOSPITAL_COMMUNITY): Payer: Self-pay

## 2023-08-18 DIAGNOSIS — S61419A Laceration without foreign body of unspecified hand, initial encounter: Secondary | ICD-10-CM

## 2023-08-18 DIAGNOSIS — W540XXA Bitten by dog, initial encounter: Secondary | ICD-10-CM

## 2023-08-18 DIAGNOSIS — Z23 Encounter for immunization: Secondary | ICD-10-CM

## 2023-08-18 MED ORDER — TETANUS-DIPHTH-ACELL PERTUSSIS 5-2.5-18.5 LF-MCG/0.5 IM SUSY
0.5000 mL | PREFILLED_SYRINGE | Freq: Once | INTRAMUSCULAR | Status: AC
  Administered 2023-08-18: 0.5 mL via INTRAMUSCULAR

## 2023-08-18 MED ORDER — AMOXICILLIN-POT CLAVULANATE 875-125 MG PO TABS: 1.0000 | ORAL_TABLET | Freq: Two times a day (BID) | ORAL | 0 refills | Status: AC

## 2023-08-18 MED ORDER — TETANUS-DIPHTH-ACELL PERTUSSIS 5-2.5-18.5 LF-MCG/0.5 IM SUSY
PREFILLED_SYRINGE | INTRAMUSCULAR | Status: AC
  Filled 2023-08-18: qty 0.5

## 2023-08-18 NOTE — ED Triage Notes (Addendum)
Pt presents to urgent care after dog bite on left hand yesterday @ 2 PM. Pt reports he was trying to break up a dog fight between his dog and his friend's dog. Bleeding is controlled. Pt states "I feel tingling in my left hand every once in a while but I am not in any pain." As far as pt knows, "both dogs are up-to-date on their vaccines." Pt did apply alcohol and hydrogen peroxide to area after incident occurred.

## 2023-08-18 NOTE — ED Provider Notes (Signed)
[facility]    CSN: [id number] Arrival date & time: 08/18/23  1344      History   Chief Complaint Chief Complaint  Patient presents with   Animal Bite    HPI Terry Russo is a 77 y.o. male.    Animal Bite Here for dog bite to left hand, which caused a laceration to the dorsum of his left hand. It was a neighbor's dog, UTD on shots. It occurred about 2 PM yesterday, or 26 hours ago. His dog and the neighbor's dog had gotten into a fight, and the pt tried to break up the fight.  His last tetanus was 2015.  He did clean the wound with peroxide and alcohol after the injury  NKDA.  Past Medical History:  Diagnosis Date   Chicken pox    Heart murmur    Hypertension    Right eye injury    pt has glass eye   TIA (transient ischemic attack) 2017    Patient Active Problem List   Diagnosis Date Noted   Sciatica of right side 11/26/2021   Sacroiliac joint pain 11/26/2021   Medicare annual wellness visit, subsequent 08/31/2021   Elevated PSA 08/31/2021   Screening for colon cancer 08/31/2021   Advanced directives, counseling/discussion 08/31/2021   Pain, dental 05/25/2021   Alcohol use with alcohol-induced mood disorder (HCC) 03/19/2021   Involuntary commitment    Erectile dysfunction 07/07/2019   TIA (transient ischemic attack) 06/03/2016   Uncontrolled hypertension 10/18/2011    Past Surgical History:  Procedure Laterality Date   ABDOMINAL SURGERY     AMPUTATION FINGER / THUMB     left   COLONOSCOPY  10/31/2011   Procedure: COLONOSCOPY;  Surgeon: Rob Bunting, MD;  Location: [facility];  Service: Endoscopy;  Laterality: N/A;   ENUCLEATION     glass eye   HERNIA REPAIR         Home Medications    Prior to Admission medications   Medication Sig Start Date End Date Taking? Authorizing Provider  amoxicillin-clavulanate (AUGMENTIN) 875-125 MG tablet Take 1 tablet by mouth 2 (two) times daily for 7 days. 08/18/23 08/25/23 Yes Zenia Resides,  MD  amLODipine (NORVASC) 10 MG tablet Take 1 tablet (10 mg total) by mouth daily. No refills until seen in office 04/17/23   Eden Emms, NP  aspirin 81 MG tablet Take 81 mg by mouth daily.    [provider]  ibuprofen (ADVIL) 200 MG tablet Take 200 mg by mouth every 6 (six) hours as needed.    [provider]  Multiple Vitamins-Minerals (EYE VITAMINS PO) Take by mouth.    [provider]  sildenafil (VIAGRA) 50 MG tablet TAKE 1/2 TO 1 (ONE-HALF TO ONE) TABLET BY MOUTH  DAILY AS NEEDED FOR ERECTILE DYSFUNCTION 03/16/20   Lorre Munroe, NP  valsartan-hydrochlorothiazide (DIOVAN-HCT) 320-25 MG tablet TAKE 1 TABLET BY MOUTH EVERY DAY 10/15/22   Eden Emms, NP    Family History Family History  Problem Relation Age of Onset   Hypertension Mother    CVA Father    Hypertension Sister    Hypertension Sister    Diabetes Brother    Cancer Neg Hx    Heart disease Neg Hx    Stroke Neg Hx    Colon cancer Neg Hx    Colon polyps Neg Hx    Esophageal cancer Neg Hx    Stomach cancer Neg Hx    Rectal cancer Neg Hx  Social History Social History   Tobacco Use   Smoking status: Former    Current packs/day: 0.00    Average packs/day: 1.5 packs/day for 20.0 years (30.0 ttl pk-yrs)    Types: Cigarettes    Start date: 09/09/1977    Quit date: 09/09/1997    Years since quitting: 25.9   Smokeless tobacco: Never  Vaping Use   Vaping status: Never Used  Substance Use Topics   Alcohol use: Yes    Alcohol/week: 12.0 standard drinks of alcohol    Types: 12 Cans of beer per week    Comment: half to a whole beer - occasionally (social)   Drug use: No     Allergies   Patient has no known allergies.   Review of Systems Review of Systems   Physical Exam Triage Vital Signs ED Triage Vitals  Encounter Vitals Group     BP 08/18/23 1625 (!) 190/76     Systolic BP Percentile --      Diastolic BP Percentile --      Pulse Rate 08/18/23 1625 (!) 59     Resp  08/18/23 1625 16     Temp 08/18/23 1625 98.2 F (36.8 C)     Temp Source 08/18/23 1625 Oral     SpO2 08/18/23 1625 98 %     Weight 08/18/23 1623 183 lb (83 kg)     Height 08/18/23 1623 5\' 11"  (1.803 m)     Head Circumference --      Peak Flow --      Pain Score 08/18/23 1623 0     Pain Loc --      Pain Education --      Exclude from Growth Chart --    No data found.  Updated Vital Signs BP (!) 190/76 (BP Location: Right Arm) Comment: pt reports taking his BP medication this AM @ 0730.  Pulse (!) 59   Temp 98.2 F (36.8 C) (Oral)   Resp 16   Ht 5\' 11"  (1.803 m)   Wt 83 kg   SpO2 98%   BMI 25.52 kg/m   Visual Acuity Right Eye Distance:   Left Eye Distance:   Bilateral Distance:    Right Eye Near:   Left Eye Near:    Bilateral Near:     Physical Exam Vitals reviewed.  Constitutional:      General: He is not in acute distress.    Appearance: He is not ill-appearing, toxic-appearing or diaphoretic.  Musculoskeletal:     Comments: He is missing the distal portions of his second and third digits on his left hand and the entire left thumb.  This is from an old injury he states.  Skin:    Coloration: Skin is not jaundiced or pale.     Comments: There is a linear laceration about 4 cm long on the dorsum of his left hand.  It is fairly shallow.  It is not bleeding at this time.  No swelling.  Neurological:     General: No focal deficit present.     Mental Status: He is alert and oriented to person, place, and time.  Psychiatric:        Behavior: Behavior normal.      UC Treatments / Results  Labs (all labs ordered are listed, but only abnormal results are displayed) Labs Reviewed - No data to display  EKG   Radiology No results found.  Procedures Procedures (including critical care time)  Medications Ordered in UC  Medications  Tdap (BOOSTRIX) injection 0.5 mL (has no administration in time range)    Initial Impression / Assessment and Plan / UC Course   I have reviewed the triage vital signs and the nursing notes.  Pertinent labs & imaging results that were available during my care of the patient were reviewed by me and considered in my medical decision making (see chart for details).     Clean bandage with bacitracin is applied here.  Wound care is discussed.   Augmentin is sent in to prevent infection since I think this hand is at risk for infection from the dog bite.  Tdap booster is given  I discussed with him just to make sure that the neighbors dog is up-to-date on vaccines and observe the dog for any signs of illness in the next 10 days Final Clinical Impressions(s) / UC Diagnoses   Final diagnoses:  Laceration of dorsum of hand  Dog bite, initial encounter     Discharge Instructions      You have been given a Tdap vaccination to boost your tetanus immunity  Take amoxicillin-clavulanate 875 mg--1 tab twice daily with food for 7 days  Clean the wound with soapy water 1-2 times daily, then put new antibiotic ointment and a clean bandage on.       ED Prescriptions     Medication Sig Dispense Auth. Provider   amoxicillin-clavulanate (AUGMENTIN) 875-125 MG tablet Take 1 tablet by mouth 2 (two) times daily for 7 days. 14 tablet Roseana Rhine, Janace Aris, MD      PDMP not reviewed this encounter.   Zenia Resides, MD 08/18/23 [phone]

## 2023-08-18 NOTE — Discharge Instructions (Addendum)
You have been given a Tdap vaccination to boost your tetanus immunity  Take amoxicillin-clavulanate 875 mg--1 tab twice daily with food for 7 days  Clean the wound with soapy water 1-2 times daily, then put new antibiotic ointment and a clean bandage on.

## 2023-08-18 NOTE — Telephone Encounter (Signed)
Walked in to make medication appointment mentioned he had been bit by a dog wanted someone to look at it

## 2023-08-18 NOTE — Telephone Encounter (Signed)
Pt has approx 1" open skin tear where was bitten by dog on 08/17/23; no redness or drainage noted but open wound on back of lt hand. Pt last Tdap 11/01/2013. No available appts at [facility] and  pt knows where [facility] is on [address]. Pt is going to [facility] now for eval, abx if needed and tetanus shot. Sending note to M.Cable NP.

## 2023-08-18 NOTE — Telephone Encounter (Signed)
Agree with disposition and being seen. He will need an updated tetanus vaccine for sure

## 2023-08-20 ENCOUNTER — Ambulatory Visit: Payer: Medicare HMO | Admitting: Nurse Practitioner

## 2023-08-21 ENCOUNTER — Ambulatory Visit: Payer: Medicare HMO | Admitting: Nurse Practitioner

## 2023-08-21 ENCOUNTER — Encounter: Payer: Self-pay | Admitting: Nurse Practitioner

## 2023-08-21 VITALS — BP 142/72 | HR 76 | Temp 98.5°F | Ht 71.0 in | Wt 171.0 lb

## 2023-08-21 DIAGNOSIS — N529 Male erectile dysfunction, unspecified: Secondary | ICD-10-CM

## 2023-08-21 DIAGNOSIS — G459 Transient cerebral ischemic attack, unspecified: Secondary | ICD-10-CM

## 2023-08-21 DIAGNOSIS — Z0000 Encounter for general adult medical examination without abnormal findings: Secondary | ICD-10-CM

## 2023-08-21 DIAGNOSIS — Z87891 Personal history of nicotine dependence: Secondary | ICD-10-CM

## 2023-08-21 DIAGNOSIS — Z125 Encounter for screening for malignant neoplasm of prostate: Secondary | ICD-10-CM | POA: Diagnosis not present

## 2023-08-21 DIAGNOSIS — I10 Essential (primary) hypertension: Secondary | ICD-10-CM

## 2023-08-21 DIAGNOSIS — Z23 Encounter for immunization: Secondary | ICD-10-CM | POA: Diagnosis not present

## 2023-08-21 LAB — LIPID PANEL
Cholesterol: 200 mg/dL (ref 0–200)
HDL: 136.1 mg/dL (ref >39.00)
LDL Cholesterol: 55 mg/dL (ref 0–99)
NonHDL: 63.58
Total CHOL/HDL Ratio: 1
Triglycerides: 42 mg/dL (ref 0.0–149.0)
VLDL: 8.4 mg/dL (ref 0.0–40.0)

## 2023-08-21 LAB — CBC
HCT: 39.1 % (ref 39.0–52.0)
Hemoglobin: 13.2 g/dL (ref 13.0–17.0)
MCHC: 33.8 g/dL (ref 30.0–36.0)
MCV: 99.1 fL (ref 78.0–100.0)
Platelets: 166 10*3/uL (ref 150.0–400.0)
RBC: 3.95 Mil/uL — ABNORMAL LOW (ref 4.22–5.81)
RDW: 13.2 % (ref 11.5–15.5)
WBC: 3.9 10*3/uL — ABNORMAL LOW (ref 4.0–10.5)

## 2023-08-21 LAB — COMPREHENSIVE METABOLIC PANEL
ALT: 16 U/L (ref 0–53)
AST: 21 U/L (ref 0–37)
Albumin: 4.6 g/dL (ref 3.5–5.2)
Alkaline Phosphatase: 61 U/L (ref 39–117)
BUN: 16 mg/dL (ref 6–23)
CO2: 28 meq/L (ref 19–32)
Calcium: 9.8 mg/dL (ref 8.4–10.5)
Chloride: 101 meq/L (ref 96–112)
Creatinine, Ser: 1.3 mg/dL (ref 0.40–1.50)
GFR: 53.71 mL/min — ABNORMAL LOW (ref >60.00)
Glucose, Bld: 137 mg/dL — ABNORMAL HIGH (ref 70–99)
Potassium: 3.9 meq/L (ref 3.5–5.1)
Sodium: 139 meq/L (ref 135–145)
Total Bilirubin: 1.4 mg/dL — ABNORMAL HIGH (ref 0.2–1.2)
Total Protein: 7.5 g/dL (ref 6.0–8.3)

## 2023-08-21 LAB — URINALYSIS, MICROSCOPIC ONLY

## 2023-08-21 LAB — TSH: TSH: 0.49 u[IU]/mL (ref 0.35–5.50)

## 2023-08-21 LAB — PSA, MEDICARE: PSA: 9.16 ng/mL — ABNORMAL HIGH (ref 0.10–4.00)

## 2023-08-21 MED ORDER — VALSARTAN-HYDROCHLOROTHIAZIDE 320-25 MG PO TABS: 1.0000 | ORAL_TABLET | Freq: Every day | ORAL | 3 refills | Status: AC

## 2023-08-21 MED ORDER — AMLODIPINE BESYLATE 10 MG PO TABS: 10.0000 mg | ORAL_TABLET | Freq: Every day | ORAL | 3 refills | Status: DC

## 2023-08-21 MED ORDER — SILDENAFIL CITRATE 50 MG PO TABS: ORAL_TABLET | ORAL | 5 refills | Status: AC

## 2023-08-21 NOTE — Patient Instructions (Signed)
Nice to see you today I will be int ouch with the labs once I have reviewed them Follow up with me in 1 year, sooner if you need me We did update your flu vaccine today   Update your shingles vaccine at the local pharmacy

## 2023-08-21 NOTE — Assessment & Plan Note (Signed)
History of same.  Patient states this more with obtaining an erection sildenafil 50 mg prescription sent to pharmacy.

## 2023-08-21 NOTE — Progress Notes (Signed)
Established Patient Office Visit  Subjective   Patient ID: ORVILL EDE, male    DOB: 1946/08/26  Age: 77 y.o. MRN: [id number]  Chief Complaint  Patient presents with   Follow-up    Hypertension    Medication Refill    Amlodipine, valsartan, and slidenafil.       for complete physical and follow up of chronic conditions.  HTN: Patient supposed be currently maintained on amlodipine 10 mg and valsartan-hydrochlorothiazide 320 mg - 25 mg daily. States that he check blood pressure once a month. States that he will get some readings of 180 and sometimes it is low 70s (diastolic)  ED: states that he has difficulty obtianing an erection   TIA: Patient currently maintained on aspirin daily  Immunizations: -Tetanus: Completed in 2024 -Influenza:  update today  -Shingles: Get at local pharmacy -Pneumonia: Completed Prevnar 20  Diet: Fair diet. 2 meals a day and some snacks. States that he will drink coffee, water and tea  Exercise: No regular exercise. States that he will do some weights at home  Eye exam: Completes annually. Weas glasses   Dental exam: states that he got teeth pulled and supposed to be getting a plate     Colonoscopy: Completed in 05/01/2022 recall 7 years Lung Cancer Screening: Former smoker does not qualify  PSA: Due  Sleep: goes to bed 830-930 and will get up around 1-2AM and will watch tv. Feels rested       Review of Systems  Constitutional:  Negative for chills and fever.  Respiratory:  Negative for shortness of breath.   Cardiovascular:  Negative for chest pain and leg swelling.  Gastrointestinal:  Negative for abdominal pain, blood in stool, constipation, diarrhea, nausea and vomiting.       BM daily   Genitourinary:  Negative for dysuria and hematuria.       Nocturia x4  Neurological:  Negative for tingling and headaches.  Psychiatric/Behavioral:  Negative for hallucinations and suicidal ideas.       Objective:     BP (!) 142/72    Pulse 76   Temp 98.5 F (36.9 C)   Ht 5\' 11"  (1.803 m)   Wt 171 lb (77.6 kg)   SpO2 96%   BMI 23.85 kg/m  BP Readings from Last 3 Encounters:  08/21/23 (!) 142/72  08/18/23 (!) 190/76  05/01/22 (!) 156/93   Wt Readings from Last 3 Encounters:  08/21/23 171 lb (77.6 kg)  08/18/23 183 lb (83 kg)  05/01/22 176 lb (79.8 kg)   SpO2 Readings from Last 3 Encounters:  08/21/23 96%  08/18/23 98%  05/01/22 100%      Physical Exam Vitals and nursing note reviewed.  Constitutional:      Appearance: Normal appearance.  HENT:     Right Ear: Tympanic membrane, ear canal and external ear normal.     Left Ear: Tympanic membrane, ear canal and external ear normal.     Mouth/Throat:     Mouth: Mucous membranes are moist.     Pharynx: Oropharynx is clear.  Eyes:     Extraocular Movements: Extraocular movements intact.     Pupils: Pupils are equal, round, and reactive to light.     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     Pulses: Normal pulses.     Heart sounds: Normal heart sounds.  Pulmonary:     Effort: Pulmonary effort is  normal.     Breath sounds: Normal breath sounds.  Abdominal:     General: Bowel sounds are normal. There is no distension.     Palpations: There is no mass.     Tenderness: There is no abdominal tenderness.     Hernia: No hernia is present.  Musculoskeletal:     Right lower leg: No edema.     Left lower leg: No edema.     Comments: Missing the thumb and first to digits on the left hand   Lymphadenopathy:     Cervical: No cervical adenopathy.  Skin:    General: Skin is warm.  Neurological:     General: No focal deficit present.     Mental Status: He is alert.     Deep Tendon Reflexes:     Reflex Scores:      Bicep reflexes are 2+ on the right side and 2+ on the left side.      Patellar reflexes are 2+ on the right side and 2+ on the left side.    Comments: Bilateral upper and lower  extremity strength 5/5  Psychiatric:        Mood and Affect: Mood normal.        Behavior: Behavior normal.        Thought Content: Thought content normal.        Judgment: Judgment normal.      No results found for any visits on 08/21/23.    The ASCVD Risk score (Arnett DK, et al., 2019) failed to calculate for the following reasons:   The valid HDL cholesterol range is 20 to 100 mg/dL    Assessment & Plan:   Problem List Items Addressed This Visit       Cardiovascular and Mediastinum   Primary hypertension   Patient currently maintained on amlodipine 10 mg and valsartan-hydrochlorothiazide 320 mg - 25 mg.  Patient blood pressure decently controlled.  Given age and okay with it being slightly above goal.  Refills provided today pending basic labs      Relevant Medications   amLODipine (NORVASC) 10 MG tablet   sildenafil (VIAGRA) 50 MG tablet   valsartan-hydrochlorothiazide (DIOVAN-HCT) 320-25 MG tablet   Other Relevant Orders   CBC   Comprehensive metabolic panel   Lipid panel   TSH   TIA (transient ischemic attack)   History of same no deficits.  Patient on aspirin daily      Relevant Medications   amLODipine (NORVASC) 10 MG tablet   sildenafil (VIAGRA) 50 MG tablet   valsartan-hydrochlorothiazide (DIOVAN-HCT) 320-25 MG tablet   Other Relevant Orders   Lipid panel     Other   Erectile dysfunction   History of same.  Patient states this more with obtaining an erection sildenafil 50 mg prescription sent to pharmacy.      Relevant Medications   sildenafil (VIAGRA) 50 MG tablet   Preventative health care - Primary   Discussed age-appropriate immunizations and screening exams.  Did review patient's personal, surgical, social, family histories.  Patient is up-to-date on all age-appropriate vaccinations he would like.  Update flu vaccine today.  Get shingles vaccine at local pharmacy.  Patient is up-to-date on CRC screening.  PSA today for prostate cancer screening.   Patient was given information at discharge about preventative healthcare maintenance with anticipatory guidance.      Relevant Orders   CBC   Comprehensive metabolic panel   TSH   Former smoker   Pending urine microscopy to  rule out microscopic hematuria.      Relevant Orders   Urine Microscopic   Other Visit Diagnoses       Screening for prostate cancer       Relevant Orders   PSA, Medicare     Need for influenza vaccination       Relevant Orders   Flu Vaccine Trivalent High Dose (Fluad) (Completed)       Return in about 1 year (around 08/20/2024) for CPE and Labs.    Audria Nine, NP

## 2023-08-21 NOTE — Assessment & Plan Note (Signed)
Patient currently maintained on amlodipine 10 mg and valsartan-hydrochlorothiazide 320 mg - 25 mg.  Patient blood pressure decently controlled.  Given age and okay with it being slightly above goal.  Refills provided today pending basic labs

## 2023-08-21 NOTE — Assessment & Plan Note (Signed)
Discussed age-appropriate immunizations and screening exams.  Did review patient's personal, surgical, social, family histories.  Patient is up-to-date on all age-appropriate vaccinations he would like.  Update flu vaccine today.  Get shingles vaccine at local pharmacy.  Patient is up-to-date on CRC screening.  PSA today for prostate cancer screening.  Patient was given information at discharge about preventative healthcare maintenance with anticipatory guidance.

## 2023-08-21 NOTE — Assessment & Plan Note (Signed)
History of same no deficits.  Patient on aspirin daily

## 2023-08-21 NOTE — Assessment & Plan Note (Signed)
Pending urine microscopy to rule out microscopic hematuria

## 2023-08-26 ENCOUNTER — Telehealth: Payer: Self-pay | Admitting: Nurse Practitioner

## 2023-08-26 DIAGNOSIS — R972 Elevated prostate specific antigen [PSA]: Secondary | ICD-10-CM

## 2023-08-26 NOTE — Telephone Encounter (Signed)
Pt called back returning Janae's call regarding results. Told pt Cable's response, pt had no questions/concerns. Pt states he prefers the [facility] for urology referral. Call back # [phone]

## 2023-08-27 NOTE — Addendum Note (Signed)
Addended by: Eden Emms on: 08/27/2023 01:17 PM   Modules accepted: Orders

## 2023-08-27 NOTE — Telephone Encounter (Signed)
Referral placed.

## 2023-09-04 ENCOUNTER — Telehealth: Payer: Self-pay | Admitting: Nurse Practitioner

## 2023-09-04 DIAGNOSIS — R972 Elevated prostate specific antigen [PSA]: Secondary | ICD-10-CM

## 2023-09-04 NOTE — Telephone Encounter (Signed)
Referral placed.

## 2023-09-04 NOTE — Telephone Encounter (Signed)
-----   Message from [facility] T sent at 09/01/2023  4:37 PM EST ----- Called patient reviewed all information and repeated back to me.  Pt prefers [facility].

## 2023-09-06 ENCOUNTER — Other Ambulatory Visit: Payer: Self-pay | Admitting: Nurse Practitioner

## 2023-09-06 DIAGNOSIS — I10 Essential (primary) hypertension: Secondary | ICD-10-CM

## 2023-09-17 ENCOUNTER — Other Ambulatory Visit: Payer: Self-pay | Admitting: Nurse Practitioner

## 2023-09-17 DIAGNOSIS — I10 Essential (primary) hypertension: Secondary | ICD-10-CM

## 2023-09-21 ENCOUNTER — Other Ambulatory Visit: Payer: Self-pay | Admitting: Nurse Practitioner

## 2023-09-21 DIAGNOSIS — I10 Essential (primary) hypertension: Secondary | ICD-10-CM

## 2023-09-23 ENCOUNTER — Ambulatory Visit (INDEPENDENT_AMBULATORY_CARE_PROVIDER_SITE_OTHER): Payer: Medicare HMO | Admitting: *Deleted

## 2023-09-23 DIAGNOSIS — Z0000 Encounter for general adult medical examination without abnormal findings: Secondary | ICD-10-CM | POA: Diagnosis not present

## 2023-09-23 NOTE — Progress Notes (Signed)
 Subjective:   Terry Russo is a 78 y.o. male who presents for Medicare Annual/Subsequent preventive examination.  Visit Complete: Telephonel I connected with  [place] LITTIE Abe on 09/23/23 by a video and audio enabled telemedicine application and verified that I am speaking with the correct person using two identifiers.  Patient Location: Home  Provider Location: Home Office  I discussed the limitations of evaluation and management by telemedicine. The patient expressed understanding and agreed to proceed.  Vital Signs: Because this visit was a virtual/telehealth visit, some criteria may be missing or patient reported. Any vitals not documented were not able to be obtained and vitals that have been documented are patient reported.    Cardiac Risk Factors include: advanced age (>16men, >5 women);hypertension;male gender     Objective:    There were no vitals filed for this visit. There is no height or weight on file to calculate BMI.     09/23/2023    9:08 AM 01/01/2022    9:09 AM 10/08/2016    3:10 PM 12/27/2015    5:42 PM 10/31/2011    7:26 AM  Advanced Directives  Does Patient Have a Medical Advance Directive? No No No No Patient does not have advance directive  Would patient like information on creating a medical advance directive?  No - Patient declined  No - patient declined information   Pre-existing out of facility DNR order (yellow form or pink MOST form)     No    Current Medications (verified) Outpatient Encounter Medications as of 09/23/2023  Medication Sig   amLODipine  (NORVASC ) 10 MG tablet TAKE 1 TABLET (10 MG TOTAL) BY MOUTH DAILY. NO REFILLS UNTIL SEEN IN OFFICE   aspirin  81 MG tablet Take 81 mg by mouth daily.   ibuprofen (ADVIL) 200 MG tablet Take 200 mg by mouth every 6 (six) hours as needed.   Multiple Vitamins-Minerals (EYE VITAMINS PO) Take by mouth.   sildenafil  (VIAGRA ) 50 MG tablet TAKE 1/2 TO 1 (ONE-HALF TO ONE) TABLET BY MOUTH  DAILY AS NEEDED FOR  ERECTILE DYSFUNCTION   valsartan -hydrochlorothiazide  (DIOVAN -HCT) 320-25 MG tablet Take 1 tablet by mouth daily.   No facility-administered encounter medications on file as of 09/23/2023.    Allergies (verified) Patient has no known allergies.   History: Past Medical History:  Diagnosis Date   Chicken pox    Heart murmur    Hypertension    Right eye injury    pt has glass eye   TIA (transient ischemic attack) 2017   Past Surgical History:  Procedure Laterality Date   ABDOMINAL SURGERY     AMPUTATION FINGER / THUMB     left   COLONOSCOPY  10/31/2011   Procedure: COLONOSCOPY;  Surgeon: Toribio Cedar, MD;  Location: [facility];  Service: Endoscopy;  Laterality: N/A;   ENUCLEATION     glass eye   HERNIA REPAIR     Family History  Problem Relation Age of Onset   Hypertension Mother    CVA Father    Hypertension Sister    Hypertension Sister    Diabetes Brother    Cancer Neg Hx    Heart disease Neg Hx    Stroke Neg Hx    Colon cancer Neg Hx    Colon polyps Neg Hx    Esophageal cancer Neg Hx    Stomach cancer Neg Hx    Rectal cancer Neg Hx    Social History   Socioeconomic History   Marital status: Widowed  Spouse name: Sherrilyn   Number of children: 7   Years of education: Not on file   Highest education level: Not on file  Occupational History   Occupation: Retired psychologist, occupational, now doing chiropractor  Tobacco Use   Smoking status: Former    Current packs/day: 0.00    Average packs/day: 1.5 packs/day for 20.0 years (30.0 ttl pk-yrs)    Types: Cigarettes    Start date: 09/09/1977    Quit date: 09/09/1997    Years since quitting: 26.0   Smokeless tobacco: Never  Vaping Use   Vaping status: Never Used  Substance and Sexual Activity   Alcohol use: Yes    Alcohol/week: 4.0 standard drinks of alcohol    Types: 4 Cans of beer per week    Comment: half to a whole beer - occasionally (social)   Drug use: No   Sexual activity: Not Currently  Other Topics Concern    Not on file  Social History Narrative   Lives with wife at home   Social Drivers of Health   Financial Resource Strain: Low Risk  (09/23/2023)   Overall Financial Resource Strain (CARDIA)    Difficulty of Paying Living Expenses: Not hard at all  Food Insecurity: No Food Insecurity (09/23/2023)   Hunger Vital Sign    Worried About Running Out of Food in the Last Year: Never true    Ran Out of Food in the Last Year: Never true  Transportation Needs: No Transportation Needs (09/23/2023)   PRAPARE - Administrator, Civil Service (Medical): No    Lack of Transportation (Non-Medical): No  Physical Activity: Inactive (09/23/2023)   Exercise Vital Sign    Days of Exercise per Week: 0 days    Minutes of Exercise per Session: 0 min  Stress: No Stress Concern Present (09/23/2023)   [employer] of Occupational Health - Occupational Stress Questionnaire    Feeling of Stress : Not at all  Social Connections: Moderately Integrated (09/23/2023)   Social Connection and Isolation Panel [NHANES]    Frequency of Communication with Friends and Family: Three times a week    Frequency of Social Gatherings with Friends and Family: Never    Attends Religious Services: 1 to 4 times per year    Active Member of [employer] or Organizations: Yes    Attends Banker Meetings: Never    Marital Status: Divorced    Tobacco Counseling Counseling given: Not Answered   Clinical Intake:  Pre-visit preparation completed: Yes  Pain : No/denies pain     Diabetes: No  How often do you need to have someone help you when you read instructions, pamphlets, or other written materials from your doctor or pharmacy?: 1 - Never  Interpreter Needed?: No  Information entered by :: Mliss Graff LPN   Activities of Daily Living    09/23/2023    9:10 AM  In your present state of health, do you have any difficulty performing the following activities:  Hearing? 0  Vision? 0  Difficulty  concentrating or making decisions? 0  Walking or climbing stairs? 0  Dressing or bathing? 0  Doing errands, shopping? 0  Preparing Food and eating ? N  Using the Toilet? N  In the past six months, have you accidently leaked urine? N  Do you have problems with loss of bowel control? N  Managing your Medications? N  Managing your Finances? N  Housekeeping or managing your Housekeeping? N    Patient Care Team:  Wendee Lynwood HERO, NP as PCP - General  Indicate any recent Medical Services you may have received from other than [facility] providers in the past year (date may be approximate).     Assessment:   This is a routine wellness examination for Jasmon.  Hearing/Vision screen Hearing Screening - Comments:: No trouble hearing Vision Screening - Comments:: [place]  Up to date   Goals Addressed             This Visit's Progress    Patient Stated       Continue current lifestyle        Depression Screen    09/23/2023    9:13 AM 08/31/2021    8:40 AM 05/25/2021   11:38 AM 03/16/2020   11:52 AM 06/03/2016    3:35 PM 12/27/2015    4:40 PM 06/30/2015    6:03 PM  PHQ 2/9 Scores  PHQ - 2 Score 0 0 0 0 0 0 0  PHQ- 9 Score 0          Fall Risk    09/23/2023    9:04 AM 08/31/2021    9:08 AM 05/25/2021   10:57 AM 03/16/2020   11:52 AM 06/03/2016    3:35 PM  Fall Risk   Falls in the past year? 0 0 0 0 No  Number falls in past yr: 0 0 0    Injury with Fall? 0 0 0    Follow up Falls evaluation completed;Education provided;Falls prevention discussed        MEDICARE RISK AT HOME: Medicare Risk at Home Any stairs in or around the home?: Yes If so, are there any without handrails?: No Home free of loose throw rugs in walkways, pet beds, electrical cords, etc?: Yes Adequate lighting in your home to reduce risk of falls?: Yes Life alert?: No Use of a cane, walker or w/c?: No Grab bars in the bathroom?: No Shower chair or bench in shower?: Yes Elevated toilet seat or a handicapped  toilet?: No  TIMED UP AND GO:  Was the test performed?  No    Cognitive Function:        09/23/2023    9:08 AM  6CIT Screen  What Year? 0 points  What month? 0 points  What time? 0 points  Count back from 20 2 points  Months in reverse 4 points  Repeat phrase 2 points  Total Score 8 points    Immunizations Immunization History  Administered Date(s) Administered   Fluad Quad(high Dose 65+) 05/25/2021   Fluad Trivalent(High Dose 65+) 08/21/2023   Influenza,inj,Quad PF,6+ Mos 11/01/2013, 06/03/2016   PFIZER(Purple Top)SARS-COV-2 Vaccination 06/26/2020, 07/17/2020, 02/21/2021   PNEUMOCOCCAL CONJUGATE-20 06/27/2023   Pneumococcal Conjugate-13 06/03/2016   Pneumococcal Polysaccharide-23 10/18/2011   Tdap 11/01/2013, 08/18/2023    TDAP status: Up to date  Flu Vaccine status: Up to date  Pneumococcal vaccine status: Up to date  Covid-19 vaccine status: Information provided on how to obtain vaccines.   Qualifies for Shingles Vaccine? Yes   Zostavax completed No   Shingrix Completed?: No.    Education has been provided regarding the importance of this vaccine. Patient has been advised to call insurance company to determine out of pocket expense if they have not yet received this vaccine. Advised may also receive vaccine at local pharmacy or Health Dept. Verbalized acceptance and understanding.  Screening Tests Health Maintenance  Topic Date Due   Zoster Vaccines- Shingrix (1 of 2) 10/22/2023 (Originally 01/16/1998)   COVID-19 Vaccine (  4 - 2024-25 season) 08/20/2024 (Originally 05/11/2023)   Medicare Annual Wellness (AWV)  09/22/2024   Colonoscopy  05/01/2032   DTaP/Tdap/Td (3 - Td or Tdap) 08/17/2033   Pneumonia Vaccine 66+ Years old  Completed   INFLUENZA VACCINE  Completed   Hepatitis C Screening  Completed   HPV VACCINES  Aged Out    Health Maintenance  There are no preventive care reminders to display for this patient.   Colorectal cancer screening: No longer  required.   Lung Cancer Screening: (Low Dose CT Chest recommended if Age 2-80 years, 20 pack-year currently smoking OR have quit w/in 15years.) does not qualify.   Lung Cancer Screening Referral:   Additional Screening:  Hepatitis C Screening: does not qualify; Completed 2017  Vision Screening: Recommended annual ophthalmology exams for early detection of glaucoma and other disorders of the eye. Is the patient up to date with their annual eye exam?  Yes  Who is the provider or what is the name of the office in which the patient attends annual eye exams? [facility] If pt is not established with a provider, would they like to be referred to a provider to establish care? No .   Dental Screening: Recommended annual dental exams for proper oral hygiene    Community Resource Referral / Chronic Care Management: CRR required this visit?  No   CCM required this visit?  No     Plan:     I have personally reviewed and noted the following in the patient's chart:   Medical and social history Use of alcohol, tobacco or illicit drugs  Current medications and supplements including opioid prescriptions. Patient is not currently taking opioid prescriptions. Functional ability and status Nutritional status Physical activity Advanced directives List of other physicians Hospitalizations, surgeries, and ER visits in previous 12 months Vitals Screenings to include cognitive, depression, and falls Referrals and appointments  In addition, I have reviewed and discussed with patient certain preventive protocols, quality metrics, and best practice recommendations. A written personalized care plan for preventive services as well as general preventive health recommendations were provided to patient.     Mliss Graff, LPN   [id number]   After Visit Summary: (MyChart) Due to this being a telephonic visit, the after visit summary with patients personalized plan was offered to patient via MyChart   Nurse  Notes:

## 2023-09-23 NOTE — Patient Instructions (Signed)
 [name] , Thank you for taking time to come for your Medicare Wellness Visit. I appreciate your ongoing commitment to your health goals. Please review the following plan we discussed and let me know if I can assist you in the future.   Screening recommendations/referrals: Colonoscopy: no longer required Recommended yearly ophthalmology/optometry visit for glaucoma screening and checkup Recommended yearly dental visit for hygiene and checkup  Vaccinations: Influenza vaccine: up to date Pneumococcal vaccine: up to date Tdap vaccine: up to date Shingles vaccine: Education provided       Preventive Care 65 Years and Older, Male Preventive care refers to lifestyle choices and visits with your health care provider that can promote health and wellness. What does preventive care include? A yearly physical exam. This is also called an annual well check. Dental exams once or twice a year. Routine eye exams. Ask your health care provider how often you should have your eyes checked. Personal lifestyle choices, including: Daily care of your teeth and gums. Regular physical activity. Eating a healthy diet. Avoiding tobacco and drug use. Limiting alcohol use. Practicing safe sex. Taking low doses of aspirin  every day. Taking vitamin and mineral supplements as recommended by your health care provider. What happens during an annual well check? The services and screenings done by your health care provider during your annual well check will depend on your age, overall health, lifestyle risk factors, and family history of disease. Counseling  Your health care provider may ask you questions about your: Alcohol use. Tobacco use. Drug use. Emotional well-being. Home and relationship well-being. Sexual activity. Eating habits. History of falls. Memory and ability to understand (cognition). Work and work astronomer. Screening  You may have the following tests or measurements: Height,  weight, and BMI. Blood pressure. Lipid and cholesterol levels. These may be checked every 5 years, or more frequently if you are over 18 years old. Skin check. Lung cancer screening. You may have this screening every year starting at age 76 if you have a 30-pack-year history of smoking and currently smoke or have quit within the past 15 years. Fecal occult blood test (FOBT) of the stool. You may have this test every year starting at age 38. Flexible sigmoidoscopy or colonoscopy. You may have a sigmoidoscopy every 5 years or a colonoscopy every 10 years starting at age 50. Prostate cancer screening. Recommendations will vary depending on your family history and other risks. Hepatitis C blood test. Hepatitis B blood test. Sexually transmitted disease (STD) testing. Diabetes screening. This is done by checking your blood sugar (glucose) after you have not eaten for a while (fasting). You may have this done every 1-3 years. Abdominal aortic aneurysm (AAA) screening. You may need this if you are a current or former smoker. Osteoporosis. You may be screened starting at age 96 if you are at high risk. Talk with your health care provider about your test results, treatment options, and if necessary, the need for more tests. Vaccines  Your health care provider may recommend certain vaccines, such as: Influenza vaccine. This is recommended every year. Tetanus, diphtheria, and acellular pertussis (Tdap, Td) vaccine. You may need a Td booster every 10 years. Zoster vaccine. You may need this after age 43. Pneumococcal 13-valent conjugate (PCV13) vaccine. One dose is recommended after age 46. Pneumococcal polysaccharide (PPSV23) vaccine. One dose is recommended after age 64. Talk to your health care provider about which screenings and vaccines you need and how often you need them. This information is not intended  to replace advice given to you by your health care provider. Make sure you discuss any  questions you have with your health care provider. Document Released: 09/22/2015 Document Revised: 05/15/2016 Document Reviewed: 06/27/2015 Elsevier Interactive Patient Education  2017 [employer].  Fall Prevention in the Home Falls can cause injuries. They can happen to people of all ages. There are many things you can do to make your home safe and to help prevent falls. What can I do on the outside of my home? Regularly fix the edges of walkways and driveways and fix any cracks. Remove anything that might make you trip as you walk through a door, such as a raised step or threshold. Trim any bushes or trees on the path to your home. Use bright outdoor lighting. Clear any walking paths of anything that might make someone trip, such as rocks or tools. Regularly check to see if handrails are loose or broken. Make sure that both sides of any steps have handrails. Any raised decks and porches should have guardrails on the edges. Have any leaves, snow, or ice cleared regularly. Use sand or salt on walking paths during winter. Clean up any spills in your garage right away. This includes oil or grease spills. What can I do in the bathroom? Use night lights. Install grab bars by the toilet and in the tub and shower. Do not use towel bars as grab bars. Use non-skid mats or decals in the tub or shower. If you need to sit down in the shower, use a plastic, non-slip stool. Keep the floor dry. Clean up any water that spills on the floor as soon as it happens. Remove soap buildup in the tub or shower regularly. Attach bath mats securely with double-sided non-slip rug tape. Do not have throw rugs and other things on the floor that can make you trip. What can I do in the bedroom? Use night lights. Make sure that you have a light by your bed that is easy to reach. Do not use any sheets or blankets that are too big for your bed. They should not hang down onto the floor. Have a firm chair that has side  arms. You can use this for support while you get dressed. Do not have throw rugs and other things on the floor that can make you trip. What can I do in the kitchen? Clean up any spills right away. Avoid walking on wet floors. Keep items that you use a lot in easy-to-reach places. If you need to reach something above you, use a strong step stool that has a grab bar. Keep electrical cords out of the way. Do not use floor polish or wax that makes floors slippery. If you must use wax, use non-skid floor wax. Do not have throw rugs and other things on the floor that can make you trip. What can I do with my stairs? Do not leave any items on the stairs. Make sure that there are handrails on both sides of the stairs and use them. Fix handrails that are broken or loose. Make sure that handrails are as long as the stairways. Check any carpeting to make sure that it is firmly attached to the stairs. Fix any carpet that is loose or worn. Avoid having throw rugs at the top or bottom of the stairs. If you do have throw rugs, attach them to the floor with carpet tape. Make sure that you have a light switch at the top of the stairs and  the bottom of the stairs. If you do not have them, ask someone to add them for you. What else can I do to help prevent falls? Wear shoes that: Do not have high heels. Have rubber bottoms. Are comfortable and fit you well. Are closed at the toe. Do not wear sandals. If you use a stepladder: Make sure that it is fully opened. Do not climb a closed stepladder. Make sure that both sides of the stepladder are locked into place. Ask someone to hold it for you, if possible. Clearly mark and make sure that you can see: Any grab bars or handrails. First and last steps. Where the edge of each step is. Use tools that help you move around (mobility aids) if they are needed. These include: Canes. Walkers. Scooters. Crutches. Turn on the lights when you go into a dark area.  Replace any light bulbs as soon as they burn out. Set up your furniture so you have a clear path. Avoid moving your furniture around. If any of your floors are uneven, fix them. If there are any pets around you, be aware of where they are. Review your medicines with your doctor. Some medicines can make you feel dizzy. This can increase your chance of falling. Ask your doctor what other things that you can do to help prevent falls. This information is not intended to replace advice given to you by your health care provider. Make sure you discuss any questions you have with your health care provider. Document Released: 06/22/2009 Document Revised: 02/01/2016 Document Reviewed: 09/30/2014 Elsevier Interactive Patient Education  2017 [employer].

## 2023-09-28 ENCOUNTER — Ambulatory Visit (HOSPITAL_COMMUNITY)
Admission: RE | Admit: 2023-09-28 | Discharge: 2023-09-28 | Disposition: A | Discharge status: Home / Self Care | Payer: Medicare HMO | Source: Ambulatory Visit | Attending: Emergency Medicine | Admitting: Emergency Medicine

## 2023-09-28 ENCOUNTER — Encounter (HOSPITAL_COMMUNITY): Payer: Self-pay

## 2023-09-28 VITALS — BP 138/70 | HR 49 | Temp 98.4°F | Resp 20

## 2023-09-28 DIAGNOSIS — L600 Ingrowing nail: Secondary | ICD-10-CM

## 2023-09-28 MED ORDER — SULFAMETHOXAZOLE-TRIMETHOPRIM 800-160 MG PO TABS: 1.0000 | ORAL_TABLET | Freq: Two times a day (BID) | ORAL | 0 refills | Status: AC

## 2023-09-28 NOTE — ED Triage Notes (Signed)
Pt reports he has a ingrown toe nail on his left pinky toe x 2 weeks

## 2023-09-28 NOTE — Discharge Instructions (Signed)
Start taking Bactrim daily for 7 days for infection of ingrown toenail.  I have attached [facility] that you can follow-up with for further evaluation of your toenail.  Return here as needed.

## 2023-09-28 NOTE — ED Provider Notes (Signed)
[facility]    CSN: [id number] Arrival date & time: 09/28/23  1434      History   Chief Complaint No chief complaint on file.   HPI Terry Russo is a 78 y.o. male.   Patient presents with ingrown toenail of left little toe x 2 weeks.     Past Medical History:  Diagnosis Date   Chicken pox    Heart murmur    Hypertension    Right eye injury    pt has glass eye   TIA (transient ischemic attack) 2017    Patient Active Problem List   Diagnosis Date Noted   Former smoker 08/21/2023   Sciatica of right side 11/26/2021   Sacroiliac joint pain 11/26/2021   Medicare annual wellness visit, subsequent 08/31/2021   Elevated PSA 08/31/2021   Screening for colon cancer 08/31/2021   Preventative health care 08/31/2021   Pain, dental 05/25/2021   Alcohol use with alcohol-induced mood disorder (HCC) 03/19/2021   Involuntary commitment    Erectile dysfunction 07/07/2019   TIA (transient ischemic attack) 06/03/2016   Primary hypertension 10/18/2011    Past Surgical History:  Procedure Laterality Date   ABDOMINAL SURGERY     AMPUTATION FINGER / THUMB     left   COLONOSCOPY  10/31/2011   Procedure: COLONOSCOPY;  Surgeon: Rob Bunting, MD;  Location: [facility];  Service: Endoscopy;  Laterality: N/A;   ENUCLEATION     glass eye   HERNIA REPAIR         Home Medications    Prior to Admission medications   Medication Sig Start Date End Date Taking? Authorizing Provider  sulfamethoxazole-trimethoprim (BACTRIM DS) 800-160 MG tablet Take 1 tablet by mouth 2 (two) times daily for 7 days. 09/28/23 10/05/23 Yes Farron Watrous A, NP  amLODipine (NORVASC) 10 MG tablet TAKE 1 TABLET (10 MG TOTAL) BY MOUTH DAILY. NO REFILLS UNTIL SEEN IN OFFICE 09/17/23   Eden Emms, NP  aspirin 81 MG tablet Take 81 mg by mouth daily.    [provider]  ibuprofen (ADVIL) 200 MG tablet Take 200 mg by mouth every 6 (six) hours as needed.    [provider]   Multiple Vitamins-Minerals (EYE VITAMINS PO) Take by mouth.    [provider]  sildenafil (VIAGRA) 50 MG tablet TAKE 1/2 TO 1 (ONE-HALF TO ONE) TABLET BY MOUTH  DAILY AS NEEDED FOR ERECTILE DYSFUNCTION 08/21/23   Eden Emms, NP  valsartan-hydrochlorothiazide (DIOVAN-HCT) 320-25 MG tablet Take 1 tablet by mouth daily. 08/21/23   Eden Emms, NP    Family History Family History  Problem Relation Age of Onset   Hypertension Mother    CVA Father    Hypertension Sister    Hypertension Sister    Diabetes Brother    Cancer Neg Hx    Heart disease Neg Hx    Stroke Neg Hx    Colon cancer Neg Hx    Colon polyps Neg Hx    Esophageal cancer Neg Hx    Stomach cancer Neg Hx    Rectal cancer Neg Hx     Social History Social History   Tobacco Use   Smoking status: Former    Current packs/day: 0.00    Average packs/day: 1.5 packs/day for 20.0 years (30.0 ttl pk-yrs)    Types: Cigarettes    Start date: 09/09/1977    Quit date: 09/09/1997    Years since quitting: 26.0   Smokeless tobacco: Never  Vaping  Use   Vaping status: Never Used  Substance Use Topics   Alcohol use: Yes    Alcohol/week: 4.0 standard drinks of alcohol    Types: 4 Cans of beer per week    Comment: half to a whole beer - occasionally (social)   Drug use: No     Allergies   Patient has no known allergies.   Review of Systems Review of Systems  Skin:  Positive for color change.     Physical Exam Triage Vital Signs ED Triage Vitals [09/28/23 1553]  Encounter Vitals Group     BP 138/70     Systolic BP Percentile      Diastolic BP Percentile      Pulse Rate (!) 49     Resp 20     Temp 98.4 F (36.9 C)     Temp Source Oral     SpO2 96 %     Weight      Height      Head Circumference      Peak Flow      Pain Score 8     Pain Loc      Pain Education      Exclude from Growth Chart    No data found.  Updated Vital Signs BP 138/70 (BP Location: Right Arm)   Pulse (!) 49   Temp 98.4  F (36.9 C) (Oral)   Resp 20   SpO2 96%   Visual Acuity Right Eye Distance:   Left Eye Distance:   Bilateral Distance:    Right Eye Near:   Left Eye Near:    Bilateral Near:     Physical Exam Vitals and nursing note reviewed.  Constitutional:      General: He is awake. He is not in acute distress.    Appearance: Normal appearance. He is well-developed and well-groomed. He is not ill-appearing.  Feet:     Left foot:     Skin integrity: Erythema, warmth and callus present.     Toenail Condition: Left toenails are ingrown.     Comments: Ingrown toenail noted to left little toe with surrounding erythema, warmth, and callus. Neurological:     Mental Status: He is alert.  Psychiatric:        Behavior: Behavior is cooperative.      UC Treatments / Results  Labs (all labs ordered are listed, but only abnormal results are displayed) Labs Reviewed - No data to display  EKG   Radiology No results found.  Procedures Procedures (including critical care time)  Medications Ordered in UC Medications - No data to display  Initial Impression / Assessment and Plan / UC Course  I have reviewed the triage vital signs and the nursing notes.  Pertinent labs & imaging results that were available during my care of the patient were reviewed by me and considered in my medical decision making (see chart for details).     Patient presented with ingrown toenail of left little toe x 2 weeks.  Upon assessment ingrown tail noted to left little toe with surrounding erythema, warmth, and callus.  Prescribed Bactrim for infection coverage.  Given follow-up with [facility] for further evaluation.  Discussed return precautions. Final Clinical Impressions(s) / UC Diagnoses   Final diagnoses:  Ingrown toenail of left foot with infection     Discharge Instructions      Start taking Bactrim daily for 7 days for infection of ingrown toenail.  I have attached  [facility] that you can follow-up with for further evaluation of your toenail.  Return here as needed.    ED Prescriptions     Medication Sig Dispense Auth. Provider   sulfamethoxazole-trimethoprim (BACTRIM DS) 800-160 MG tablet Take 1 tablet by mouth 2 (two) times daily for 7 days. 14 tablet Wynonia Lawman A, NP      PDMP not reviewed this encounter.   Wynonia Lawman A, NP 09/28/23 1758

## 2023-10-08 ENCOUNTER — Ambulatory Visit: Payer: Commercial Managed Care - HMO | Admitting: Podiatry

## 2023-10-17 ENCOUNTER — Ambulatory Visit: Payer: Medicare HMO | Admitting: Podiatry

## 2023-10-31 ENCOUNTER — Ambulatory Visit: Payer: Medicare HMO | Admitting: Podiatry

## 2023-10-31 DIAGNOSIS — B351 Tinea unguium: Secondary | ICD-10-CM

## 2023-10-31 DIAGNOSIS — M79674 Pain in right toe(s): Secondary | ICD-10-CM | POA: Diagnosis not present

## 2023-10-31 DIAGNOSIS — M79675 Pain in left toe(s): Secondary | ICD-10-CM | POA: Diagnosis not present

## 2023-10-31 NOTE — Progress Notes (Signed)
  Subjective:  Patient ID: Terry Russo, male    DOB: June 13, 1946,  MRN: [id number]  Chief Complaint  Patient presents with   Nail Problem    5th toe pain   78 y.o. male returns for the above complaint.  Patient presents with thickened onychodystrophy mycotic toenails x 10 mild pain on palpation arch with ambulation as pressure he would like for me debride and is not able to himself  Objective:  There were no vitals filed for this visit. Podiatric Exam: Vascular: dorsalis pedis and posterior tibial pulses are palpable bilateral. Capillary return is immediate. Temperature gradient is WNL. Skin turgor WNL  Sensorium: Normal Semmes Weinstein monofilament test. Normal tactile sensation bilaterally. Nail Exam: Pt has thick disfigured discolored nails with subungual debris noted bilateral entire nail hallux through fifth toenails.  Pain on palpation to the nails. Ulcer Exam: There is no evidence of ulcer or pre-ulcerative changes or infection. Orthopedic Exam: Muscle tone and strength are WNL. No limitations in general ROM. No crepitus or effusions noted.  Skin: No Porokeratosis. No infection or ulcers    Assessment & Plan:  No diagnosis found.  Patient was evaluated and treated and all questions answered.  Onychomycosis with pain  -Nails palliatively debrided as below. -Educated on self-care  Procedure: Nail Debridement Rationale: pain  Type of Debridement: manual, sharp debridement. Instrumentation: Nail nipper, rotary burr. Number of Nails: 10  Procedures and Treatment: Consent by patient was obtained for treatment procedures. The patient understood the discussion of treatment and procedures well. All questions were answered thoroughly reviewed. Debridement of mycotic and hypertrophic toenails, 1 through 5 bilateral and clearing of subungual debris. No ulceration, no infection noted.  Return Visit-Office Procedure: Patient instructed to return to the office for a follow up visit 3  months for continued evaluation and treatment.  Nicholes Rough, DPM    No follow-ups on file.

## 2023-11-26 DIAGNOSIS — R972 Elevated prostate specific antigen [PSA]: Secondary | ICD-10-CM | POA: Diagnosis not present

## 2024-01-05 DIAGNOSIS — C61 Malignant neoplasm of prostate: Secondary | ICD-10-CM | POA: Diagnosis not present

## 2024-01-05 DIAGNOSIS — D075 Carcinoma in situ of prostate: Secondary | ICD-10-CM | POA: Diagnosis not present

## 2024-02-04 DIAGNOSIS — H35032 Hypertensive retinopathy, left eye: Secondary | ICD-10-CM | POA: Diagnosis not present

## 2024-02-04 DIAGNOSIS — H2512 Age-related nuclear cataract, left eye: Secondary | ICD-10-CM | POA: Diagnosis not present

## 2024-02-04 DIAGNOSIS — H04122 Dry eye syndrome of left lacrimal gland: Secondary | ICD-10-CM | POA: Diagnosis not present

## 2024-03-24 DIAGNOSIS — C61 Malignant neoplasm of prostate: Secondary | ICD-10-CM | POA: Diagnosis not present

## 2024-04-29 DIAGNOSIS — C61 Malignant neoplasm of prostate: Secondary | ICD-10-CM | POA: Diagnosis not present

## 2024-05-07 DIAGNOSIS — C61 Malignant neoplasm of prostate: Secondary | ICD-10-CM | POA: Diagnosis not present

## 2024-07-23 ENCOUNTER — Encounter: Payer: Self-pay | Admitting: Pharmacist

## 2024-07-23 NOTE — Progress Notes (Signed)
 Pharmacy Quality Measure Review  This patient is appearing on a report for being at risk of failing the Controlling Blood Pressure measure this calendar year.   Last documented BP: BP Readings from Last 1 Encounters:  09/28/23 138/70   Last PCP visit Dec 2024.  No future appointments scheduled.   Forwarded to [facility] for routine f/u / annual No future appointments.

## 2024-08-11 DIAGNOSIS — C61 Malignant neoplasm of prostate: Secondary | ICD-10-CM | POA: Diagnosis not present

## 2024-08-13 ENCOUNTER — Telehealth: Payer: Self-pay

## 2024-08-13 NOTE — Telephone Encounter (Signed)
 If a letter needs to be mailed can this be forwarded to your CMA for that portion. I am working from home and do not have a way to get this done. After mailed if CMA can send me a message I can get this closed.

## 2024-08-13 NOTE — Telephone Encounter (Signed)
 Mail letter and close referral

## 2024-08-13 NOTE — Telephone Encounter (Signed)
 Okay to close urology referral that was placed 09/04/23?

## 2024-08-17 NOTE — Telephone Encounter (Signed)
 Letter mailed and referral closed
